# Patient Record
Sex: Female | Born: 1940
Health system: Southern US, Community
[De-identification: ages and names within clinical notes are randomized; demographics above are authoritative.]

## PROBLEM LIST (undated history)

## (undated) DIAGNOSIS — I639 Cerebral infarction, unspecified: Secondary | ICD-10-CM

## (undated) DIAGNOSIS — R1013 Epigastric pain: Secondary | ICD-10-CM

## (undated) DIAGNOSIS — F419 Anxiety disorder, unspecified: Secondary | ICD-10-CM

## (undated) DIAGNOSIS — G8929 Other chronic pain: Secondary | ICD-10-CM

## (undated) DIAGNOSIS — G629 Polyneuropathy, unspecified: Secondary | ICD-10-CM

## (undated) DIAGNOSIS — E785 Hyperlipidemia, unspecified: Secondary | ICD-10-CM

## (undated) DIAGNOSIS — J309 Allergic rhinitis, unspecified: Secondary | ICD-10-CM

## (undated) DIAGNOSIS — M549 Dorsalgia, unspecified: Secondary | ICD-10-CM

## (undated) DIAGNOSIS — E119 Type 2 diabetes mellitus without complications: Secondary | ICD-10-CM

## (undated) HISTORY — DX: Other chronic pain: G89.29

## (undated) HISTORY — DX: Anxiety disorder, unspecified: F41.9

## (undated) HISTORY — DX: Hyperlipidemia, unspecified: E78.5

## (undated) HISTORY — DX: Type 2 diabetes mellitus without complications: E11.9

## (undated) HISTORY — DX: Polyneuropathy, unspecified: G62.9

## (undated) HISTORY — PX: INGUINAL HERNIA REPAIR: SHX194

## (undated) HISTORY — PX: OOPHORECTOMY: SHX86

## (undated) HISTORY — PX: PARTIAL HYSTERECTOMY: SHX80

## (undated) HISTORY — DX: Allergic rhinitis, unspecified: J30.9

## (undated) HISTORY — PX: TONSILLECTOMY: SHX5217

## (undated) HISTORY — DX: Epigastric pain: R10.13

## (undated) HISTORY — DX: Other chronic pain: M54.9

---

## 1965-08-13 HISTORY — PX: NASAL SEPTUM SURGERY: SHX37

## 2002-12-16 ENCOUNTER — Other Ambulatory Visit: Admission: RE | Admit: 2002-12-16 | Discharge: 2002-12-16 | Payer: Self-pay | Admitting: Dermatology

## 2004-02-23 ENCOUNTER — Inpatient Hospital Stay (HOSPITAL_COMMUNITY): Admission: EM | Admit: 2004-02-23 | Discharge: 2004-02-24 | Payer: Self-pay | Admitting: Emergency Medicine

## 2004-03-09 ENCOUNTER — Encounter: Payer: Self-pay | Admitting: Cardiology

## 2004-04-04 ENCOUNTER — Ambulatory Visit (HOSPITAL_COMMUNITY): Admission: RE | Admit: 2004-04-04 | Discharge: 2004-04-04 | Payer: Self-pay | Admitting: Family Medicine

## 2004-06-22 ENCOUNTER — Ambulatory Visit: Payer: Self-pay | Admitting: Family Medicine

## 2004-11-02 ENCOUNTER — Ambulatory Visit: Payer: Self-pay | Admitting: Unknown Physician Specialty

## 2005-12-17 ENCOUNTER — Other Ambulatory Visit: Payer: Self-pay

## 2005-12-17 ENCOUNTER — Ambulatory Visit: Payer: Self-pay | Admitting: Unknown Physician Specialty

## 2005-12-20 ENCOUNTER — Emergency Department: Payer: Self-pay | Admitting: General Practice

## 2005-12-20 ENCOUNTER — Other Ambulatory Visit: Payer: Self-pay

## 2006-02-05 ENCOUNTER — Ambulatory Visit: Payer: Self-pay | Admitting: Internal Medicine

## 2006-04-03 ENCOUNTER — Ambulatory Visit: Payer: Self-pay

## 2006-07-02 ENCOUNTER — Ambulatory Visit: Payer: Self-pay | Admitting: Chiropractic Medicine

## 2007-03-04 ENCOUNTER — Ambulatory Visit: Payer: Self-pay

## 2007-03-11 ENCOUNTER — Ambulatory Visit: Payer: Self-pay

## 2007-09-25 ENCOUNTER — Ambulatory Visit: Payer: Self-pay | Admitting: Gastroenterology

## 2007-09-25 ENCOUNTER — Encounter: Payer: Self-pay | Admitting: Family Medicine

## 2008-03-12 ENCOUNTER — Ambulatory Visit: Payer: Self-pay | Admitting: Unknown Physician Specialty

## 2008-03-12 ENCOUNTER — Encounter: Payer: Self-pay | Admitting: Family Medicine

## 2008-08-30 ENCOUNTER — Encounter: Payer: Self-pay | Admitting: Family Medicine

## 2008-10-28 ENCOUNTER — Ambulatory Visit: Payer: Self-pay | Admitting: Family Medicine

## 2008-10-28 DIAGNOSIS — E785 Hyperlipidemia, unspecified: Secondary | ICD-10-CM | POA: Insufficient documentation

## 2008-10-28 DIAGNOSIS — H918X9 Other specified hearing loss, unspecified ear: Secondary | ICD-10-CM

## 2008-10-29 ENCOUNTER — Encounter: Payer: Self-pay | Admitting: Family Medicine

## 2008-10-29 LAB — CONVERTED CEMR LAB
Nitrite: NEGATIVE
Protein, ur: NEGATIVE mg/dL
Urine Glucose: NEGATIVE mg/dL
WBC, UA: NONE SEEN cells/hpf (ref <3)

## 2008-10-31 ENCOUNTER — Encounter: Payer: Self-pay | Admitting: Family Medicine

## 2008-11-01 ENCOUNTER — Encounter: Payer: Self-pay | Admitting: Family Medicine

## 2008-11-02 LAB — CONVERTED CEMR LAB: Glucose, Fasting: 115 mg/dL — ABNORMAL HIGH (ref 70–99)

## 2008-11-03 ENCOUNTER — Encounter: Payer: Self-pay | Admitting: Family Medicine

## 2008-11-03 LAB — CONVERTED CEMR LAB
Albumin: 4.4 g/dL (ref 3.5–5.2)
Alkaline Phosphatase: 65 units/L (ref 39–117)
Bilirubin, Direct: 0.1 mg/dL (ref 0.0–0.3)
CO2: 24 meq/L (ref 19–32)
Chloride: 104 meq/L (ref 96–112)
Creatinine, Ser: 0.73 mg/dL (ref 0.40–1.20)
Glucose, Bld: 124 mg/dL — ABNORMAL HIGH (ref 70–99)
Indirect Bilirubin: 0.5 mg/dL (ref 0.0–0.9)
Potassium: 4.3 meq/L (ref 3.5–5.3)
Total Bilirubin: 0.6 mg/dL (ref 0.3–1.2)
Total Protein: 7.5 g/dL (ref 6.0–8.3)
Triglycerides: 160 mg/dL — ABNORMAL HIGH (ref <150)

## 2008-11-04 ENCOUNTER — Ambulatory Visit: Payer: Self-pay | Admitting: Family Medicine

## 2008-11-07 DIAGNOSIS — J309 Allergic rhinitis, unspecified: Secondary | ICD-10-CM

## 2008-11-10 ENCOUNTER — Ambulatory Visit: Payer: Self-pay | Admitting: Cardiology

## 2008-12-10 ENCOUNTER — Ambulatory Visit: Payer: Self-pay | Admitting: Family Medicine

## 2008-12-17 DIAGNOSIS — F411 Generalized anxiety disorder: Secondary | ICD-10-CM

## 2008-12-31 ENCOUNTER — Ambulatory Visit: Payer: Self-pay | Admitting: Family Medicine

## 2008-12-31 DIAGNOSIS — R21 Rash and other nonspecific skin eruption: Secondary | ICD-10-CM | POA: Insufficient documentation

## 2009-01-05 ENCOUNTER — Encounter: Payer: Self-pay | Admitting: Family Medicine

## 2009-01-11 ENCOUNTER — Ambulatory Visit (HOSPITAL_COMMUNITY): Admission: RE | Admit: 2009-01-11 | Discharge: 2009-01-11 | Payer: Self-pay | Admitting: Orthopaedic Surgery

## 2009-01-12 ENCOUNTER — Encounter (HOSPITAL_COMMUNITY): Admission: RE | Admit: 2009-01-12 | Discharge: 2009-02-11 | Payer: Self-pay | Admitting: Orthopaedic Surgery

## 2009-02-08 DIAGNOSIS — G609 Hereditary and idiopathic neuropathy, unspecified: Secondary | ICD-10-CM | POA: Insufficient documentation

## 2009-02-08 DIAGNOSIS — D539 Nutritional anemia, unspecified: Secondary | ICD-10-CM | POA: Insufficient documentation

## 2009-03-04 ENCOUNTER — Telehealth: Payer: Self-pay | Admitting: Family Medicine

## 2009-03-04 LAB — CONVERTED CEMR LAB
Cholesterol: 271 mg/dL — ABNORMAL HIGH (ref 0–200)
LDL Cholesterol: 206 mg/dL — ABNORMAL HIGH (ref 0–99)
Triglycerides: 122 mg/dL (ref <150)

## 2009-03-14 ENCOUNTER — Ambulatory Visit: Payer: Self-pay | Admitting: Unknown Physician Specialty

## 2009-06-13 ENCOUNTER — Ambulatory Visit: Payer: Self-pay | Admitting: Family Medicine

## 2009-06-13 LAB — CONVERTED CEMR LAB: Blood Glucose, Fasting: 100 mg/dL

## 2009-06-15 ENCOUNTER — Encounter: Payer: Self-pay | Admitting: Family Medicine

## 2009-06-16 LAB — CONVERTED CEMR LAB: Indirect Bilirubin: 0.2 mg/dL (ref 0.0–0.9)

## 2009-06-20 LAB — CONVERTED CEMR LAB
CO2: 24 meq/L (ref 19–32)
Calcium: 10.1 mg/dL (ref 8.4–10.5)
Cholesterol: 251 mg/dL — ABNORMAL HIGH (ref 0–200)
Creatinine, Ser: 0.65 mg/dL (ref 0.40–1.20)
HCT: 43.6 % (ref 36.0–46.0)
Lymphocytes Relative: 38 % (ref 12–46)
Lymphs Abs: 2.6 10*3/uL (ref 0.7–4.0)
MCV: 96.9 fL (ref 78.0–100.0)
Neutro Abs: 3.6 10*3/uL (ref 1.7–7.7)
Platelets: 239 10*3/uL (ref 150–400)
Potassium: 4.6 meq/L (ref 3.5–5.3)
Sodium: 144 meq/L (ref 135–145)
Total CHOL/HDL Ratio: 5.7
Triglycerides: 156 mg/dL — ABNORMAL HIGH (ref <150)
VLDL: 31 mg/dL (ref 0–40)
WBC: 6.9 10*3/uL (ref 4.0–10.5)

## 2009-11-14 ENCOUNTER — Other Ambulatory Visit: Admission: RE | Admit: 2009-11-14 | Discharge: 2009-11-14 | Payer: Self-pay | Admitting: Family Medicine

## 2009-11-14 ENCOUNTER — Ambulatory Visit: Payer: Self-pay | Admitting: Family Medicine

## 2009-11-18 ENCOUNTER — Encounter: Payer: Self-pay | Admitting: Cardiology

## 2009-11-23 LAB — CONVERTED CEMR LAB
ALT: 13 units/L (ref 0–35)
AST: 12 units/L (ref 0–37)
Bilirubin, Direct: 0.1 mg/dL (ref 0.0–0.3)
Calcium: 9.6 mg/dL (ref 8.4–10.5)
Chloride: 106 meq/L (ref 96–112)
HDL: 44 mg/dL (ref >39)
Indirect Bilirubin: 0.5 mg/dL (ref 0.0–0.9)
Sodium: 142 meq/L (ref 135–145)
Total Bilirubin: 0.6 mg/dL (ref 0.3–1.2)
Triglycerides: 148 mg/dL (ref <150)
VLDL: 30 mg/dL (ref 0–40)

## 2009-12-12 ENCOUNTER — Telehealth: Payer: Self-pay | Admitting: Family Medicine

## 2009-12-18 ENCOUNTER — Emergency Department (HOSPITAL_COMMUNITY): Admission: EM | Admit: 2009-12-18 | Discharge: 2009-12-18 | Payer: Self-pay | Admitting: Emergency Medicine

## 2010-02-14 ENCOUNTER — Telehealth (INDEPENDENT_AMBULATORY_CARE_PROVIDER_SITE_OTHER): Payer: Self-pay | Admitting: *Deleted

## 2010-02-21 ENCOUNTER — Telehealth: Payer: Self-pay | Admitting: Family Medicine

## 2010-03-21 ENCOUNTER — Ambulatory Visit: Payer: Self-pay | Admitting: Family Medicine

## 2010-03-22 LAB — CONVERTED CEMR LAB: Hgb A1c MFr Bld: 6.3 % — ABNORMAL HIGH (ref <5.7)

## 2010-03-23 ENCOUNTER — Ambulatory Visit: Payer: Self-pay | Admitting: Family Medicine

## 2010-03-24 ENCOUNTER — Ambulatory Visit (HOSPITAL_COMMUNITY)
Admission: RE | Admit: 2010-03-24 | Discharge: 2010-03-24 | Payer: Self-pay | Source: Home / Self Care | Admitting: Family Medicine

## 2010-03-28 ENCOUNTER — Encounter: Payer: Self-pay | Admitting: Family Medicine

## 2010-03-30 ENCOUNTER — Encounter: Payer: Self-pay | Admitting: Family Medicine

## 2010-03-31 ENCOUNTER — Encounter: Payer: Self-pay | Admitting: Family Medicine

## 2010-04-01 ENCOUNTER — Telehealth: Payer: Self-pay | Admitting: Family Medicine

## 2010-04-04 ENCOUNTER — Ambulatory Visit: Payer: Self-pay | Admitting: Family Medicine

## 2010-04-12 ENCOUNTER — Encounter: Payer: Self-pay | Admitting: Family Medicine

## 2010-05-09 ENCOUNTER — Telehealth: Payer: Self-pay | Admitting: Physician Assistant

## 2010-06-15 ENCOUNTER — Telehealth: Payer: Self-pay | Admitting: Family Medicine

## 2010-06-19 ENCOUNTER — Encounter: Payer: Self-pay | Admitting: Family Medicine

## 2010-06-21 ENCOUNTER — Encounter: Payer: Self-pay | Admitting: Family Medicine

## 2010-06-21 LAB — CONVERTED CEMR LAB
Basophils Relative: 1 % (ref 0–1)
Eosinophils Absolute: 0.4 10*3/uL (ref 0.0–0.7)
HDL: 50 mg/dL (ref >39)
Hemoglobin: 13.7 g/dL (ref 12.0–15.0)
LDL Cholesterol: 189 mg/dL — ABNORMAL HIGH (ref 0–99)
Lymphs Abs: 2.4 10*3/uL (ref 0.7–4.0)
Neutro Abs: 3 10*3/uL (ref 1.7–7.7)
Neutrophils Relative %: 48 % (ref 43–77)
TSH: 3.429 microintl units/mL (ref 0.350–4.500)
VLDL: 26 mg/dL (ref 0–40)

## 2010-06-22 ENCOUNTER — Encounter: Payer: Self-pay | Admitting: Family Medicine

## 2010-06-23 ENCOUNTER — Ambulatory Visit: Payer: Self-pay | Admitting: Family Medicine

## 2010-06-25 DIAGNOSIS — E739 Lactose intolerance, unspecified: Secondary | ICD-10-CM

## 2010-06-27 ENCOUNTER — Encounter: Payer: Self-pay | Admitting: Family Medicine

## 2010-09-10 LAB — CONVERTED CEMR LAB
OCCULT 1: NEGATIVE
Pap Smear: NEGATIVE

## 2010-09-12 NOTE — Letter (Signed)
Summary: ekg  ekg   Imported By: Faythe Ghee 11/18/2009 10:39:43  _____________________________________________________________________  External Attachment:    Type:   Image     Comment:   External Document

## 2010-09-12 NOTE — Progress Notes (Signed)
Summary: referral  Phone Note Other Incoming   Caller: dr simpson Summary of Call: pt has abnormal mamo, I requested this past week on the report that she be advised of this and arrangements made to ensure she has the necessary further imaging studies needed, pls let me know about this.  pLS sTAMP A SIGNED ORDER FOR the appt date you have scanned in thank you Initial call taken by: Syliva Overman MD,  April 01, 2010 9:41 AM  Follow-up for Phone Call        pt was notified last week and has appt this week for futher imagining. Follow-up by: Rudene Anda,  April 03, 2010 9:29 AM

## 2010-09-12 NOTE — Progress Notes (Signed)
Summary: rx  Phone Note Call from Patient   Summary of Call: pt would like to geta rx [facility] rhinocort. [phone]  Initial call taken by: Rudene Anda,  May 09, 2010 10:40 AM  Follow-up for Phone Call        Is this a new prescription for her?  I dont see it on her med list.  If yes, what syptoms is she having? Follow-up by: Esperanza Sheets PA,  May 09, 2010 11:53 AM  Additional Follow-up for Phone Call Additional follow up Details #1::        She used it years ago when she was having problems with her sinuses being stopped up. She said that her nose stays stopped up all the time and nothing ever comes out. It makes her woozy feeling and she doesn't know what to take. She thought maybe since the rhinocort helped before she could use it again.  Additional Follow-up by: Everitt Amber LPN,  May 09, 2010 1:17 PM    Additional Follow-up for Phone Call Additional follow up Details #2::    OK, refilled.  Appt if no improvement. Follow-up by: Esperanza Sheets PA,  May 09, 2010 1:22 PM  Additional Follow-up for Phone Call Additional follow up Details #3:: Details for Additional Follow-up Action Taken: Patient aware Additional Follow-up by: Everitt Amber LPN,  May 09, 2010 1:27 PM  New/Updated Medications: RHINOCORT AQUA 32 MCG/ACT SUSP (BUDESONIDE) use 2 sprays each nostril once daily  Prescriptions: RHINOCORT AQUA 32 MCG/ACT SUSP (BUDESONIDE) use 2 sprays each nostril once daily  #1 x 1   Entered and Authorized by:   Esperanza Sheets PA   Signed by:   Esperanza Sheets PA on 05/09/2010   Method used:   [facility] to        [facility]. [phone]* (retail)       [address]       [place], [place]  [address]       Ph: [phone] or [phone]       Fax: [phone]   RxID:   [id number]

## 2010-09-12 NOTE — Letter (Signed)
Summary: LAB ADD ON  LAB ADD ON   Imported By: Lind Guest 06/22/2010 09:01:11  _____________________________________________________________________  External Attachment:    Type:   Image     Comment:   External Document

## 2010-09-12 NOTE — Letter (Signed)
Summary: rx assistance  rx assistance   Imported By: Lind Guest 06/27/2010 07:48:10  _____________________________________________________________________  External Attachment:    Type:   Image     Comment:   External Document

## 2010-09-12 NOTE — Letter (Signed)
Summary: ent  ent   Imported By: Lind Guest 06/26/2010 16:49:56  _____________________________________________________________________  External Attachment:    Type:   Image     Comment:   External Document

## 2010-09-12 NOTE — Progress Notes (Signed)
Summary: MAMMOGRAM APPT  Phone Note Call from Patient   Summary of Call: [facility] CALLED HER AND TOLD HER WAS TIME FOR HER TO GET A MAMMOGRAM  AND THAT DR. Lodema Hong NEED TO [facility] HER OVER PHONE # [phone] AND THE FAX # [phone] Initial call taken by: Lind Guest,  February 21, 2010 9:58 AM  Follow-up for Phone Call        pls verify this is due and schedule, thanks Follow-up by: Syliva Overman MD,  February 21, 2010 11:58 AM  Additional Follow-up for Phone Call Additional follow up Details #1::        called and left message for almance to call back so I could schedule pt a mammo.  Additional Follow-up by: Rudene Anda,  February 21, 2010 3:39 PM    Additional Follow-up for Phone Call Additional follow up Details #2::    pt has appt 03/15/2010 3:00. pt was called and aware Follow-up by: Rudene Anda,  February 21, 2010 4:01 PM

## 2010-09-12 NOTE — Assessment & Plan Note (Signed)
Summary: office visit   Vital Signs:  Patient profile:   70 year old female Menstrual status:  postmenopausal Height:      67 inches Weight:      169 pounds BMI:     26.56 O2 Sat:      97 % Pulse rate:   60 / minute Pulse rhythm:   regular Resp:     16 per minute BP sitting:   100 / 70  (left arm) Cuff size:   regular  Vitals Entered By: Everitt Amber LPN (March 21, 2010 8:53 AM)  Nutrition Counseling: Patient's BMI is greater than 25 and therefore counseled on weight management options. CC: Follow up chronic problems Comments Follow up chronic problems   CC:  Follow up chronic problems.  History of Present Illness: Reports  that she has been doing fairly well. she has not been as active recently due to the weather, and she is rightly concerned that her blood sugars are not as they had been in the past. She intends to correct this, still wANTS TO AVOID MEDS IF AT ALL POSSIBLE. Denies recent fever or chills. Denies sinus pressure, nasal congestion , ear pain or sore throat. Denies chest congestion, or cough productive of sputum. Denies chest pain, palpitations, PND, orthopnea or leg swelling. Denies abdominal pain, nausea, vomitting, diarrhea or constipation. Denies change in bowel movements or bloody stool. Denies dysuria , frequency, incontinence or hesitancy. Denies  joint pain, swelling, or reduced mobility. Denies headaches,  seizures.Reports dizziness intermittently for the apst 6 months. Denies depression,or insomnia. Denies  rash, lesions, or itch.     Current Medications (verified): 1)  Alprazolam 0.25 Mg Tabs (Alprazolam) .... Take 1 Tablet By Mouth Two Times A Day 2)  Adult Aspirin Ec Low Strength 81 Mg Tbec (Aspirin) .... Take 1 Tablet By Mouth Once A Day 3)  One-A-Day Weight Smart Advance  Tabs (Multiple Vitamins-Minerals) .... Take 1 Tablet By Mouth Once A Day 4)  Claritin 10 Mg Tabs (Loratadine) .... One Tab By Mouth Qd 5)  Zantac 150 Mg Tabs (Ranitidine  Hcl) .... One Tab By Mouth Qd 6)  Red Yeast Rice Extract 600 Mg Caps (Red Yeast Rice Extract) .... Take 1 Tablet By Mouth Once A Day 7)  Fish Oil 1000 Mg Caps (Omega-3 Fatty Acids) .... Two Times A Day 8)  Meclizine Hcl 25 Mg Tabs (Meclizine Hcl) .... 1/2 Tab 6-8 Hrs 9)  Accu-Chek Aviva  Strp (Glucose Blood) .... Once Daily Testing  Allergies (verified): 1)  ! Sudafed 2)  ! * Toprol 3)  ! Cholesterol Meds  Past History:  Past Medical History: Vit D deficiency Hardening of arteries in her aorta cardiac cath negative 5 years colonscopy 2009, polyps x 3 rept in 3 years needed seasonal allergies when uncontrolled, she has had vertigo which has been very severe R leg pain, swelling and numbness, states she was told she had peripheral neuropathy. DYSPEPSIA, CHRONIC (ICD-536.8) FATIGUE (ICD-780.79) HYPERLIPIDEMIA (ICD-272.4) CHEST PAIN UNSPECIFIED (ICD-786.50) OTHER B-COMPLEX DEFICIENCIES (ICD-266.2) PERIPHERAL NEUROPATHY (ICD-356.9) SKIN RASH (ICD-782.1) SHOULDER PAIN, RIGHT (ICD-719.41) ANXIETY STATE, UNSPECIFIED (ICD-300.00) ALLERGIC RHINITIS CAUSE UNSPECIFIED (ICD-477.9) Back pain sees chiropracter for almost 50 yrs, fell off a tractor at age 89 DIABETES MELLITUS, TYPE II (ICD-250.00) OTHER SPECIFIED FORMS OF HEARING LOSS (ICD-389.8)  Review of Systems      See HPI General:  Complains of fatigue. Eyes:  Denies blurring and discharge. Endo:  Denies cold intolerance, excessive thirst, and excessive urination. Heme:  Denies abnormal  bruising and bleeding. Allergy:  Denies hives or rash and itching eyes.  Physical Exam  General:  Well-developed,well-nourished,in no acute distress; alert,appropriate and cooperative throughout examination HEENT: No facial asymmetry,  EOMI, No sinus tenderness, TM's Clear, oropharynx  pink and moist. bilateral bruits.  Chest: Clear to auscultation bilaterally.  CVS: S1, S2, No murmurs, No S3.   Abd: Soft, Nontender.  MS: Adequate ROM spine,  hips, shoulders and knees.  Ext: No edema.   CNS: CN 2-12 intact, power tone and sensation normal throughout.   Skin: Intact, no visible lesions or rashes.  Psych: Good eye contact, normal affect.  Memory intact, not anxious or depressed appearing.    Impression & Recommendations:  Problem # 1:  CAROTID BRUIT (ICD-785.9) Assessment Comment Only  Orders: Radiology Referral (Radiology)  Problem # 2:  SHOULDER PAIN (ICD-719.41) Assessment: Improved  Her updated medication list for this problem includes:    Adult Aspirin Ec Low Strength 81 Mg Tbec (Aspirin) .Marland Kitchen... Take 1 tablet by mouth once a day  Problem # 3:  HYPERLIPIDEMIA (ICD-272.4) Assessment: Comment Only  Orders: T-Lipid Profile ([id number])  Labs Reviewed: SGOT: 12 (11/22/2009)   SGPT: 13 (11/22/2009)   HDL:44 (11/22/2009), 44 (06/13/2009)  LDL:166 (11/22/2009), 176 (06/13/2009)  Chol:240 (11/22/2009), 251 (06/13/2009)  Trig:148 (11/22/2009), 156 (06/13/2009) intolerant of statins, diet and exercisae only at this time  Complete Medication List: 1)  Alprazolam 0.25 Mg Tabs (Alprazolam) .... Take 1 tablet by mouth two times a day 2)  Adult Aspirin Ec Low Strength 81 Mg Tbec (Aspirin) .... Take 1 tablet by mouth once a day 3)  One-a-day Weight Smart Advance Tabs (Multiple vitamins-minerals) .... Take 1 tablet by mouth once a day 4)  Claritin 10 Mg Tabs (Loratadine) .... One tab by mouth qd 5)  Zantac 150 Mg Tabs (Ranitidine hcl) .... One tab by mouth qd 6)  Red Yeast Rice Extract 600 Mg Caps (Red yeast rice extract) .... Take 1 tablet by mouth once a day 7)  Fish Oil 1000 Mg Caps (Omega-3 fatty acids) .... Two times a day 8)  Meclizine Hcl 25 Mg Tabs (Meclizine hcl) .... 1/2 tab 6-8 hrs 9)  Accu0chek Aviva Strips and Lancets  .... Once daily testing  Other Orders: T- Hemoglobin A1C ([id number]) T-CBC w/Diff ([id number]) T-TSH ([id number])  Patient Instructions: 1)  Please schedule a follow-up appointment in 3  months. 2)  It is important that you exercise regularly at least 20 minutes 5 times a week. If you develop chest pain, have severe difficulty breathing, or feel very tired , stop exercising immediately and seek medical attention. 3)  You need to lose weight. Consider a lower calorie diet and regular exercise.  4)  Lipid Panel prior to visit, ICD-9: 5)  TSH prior to visit, ICD-9: 6)  CBC w/ Diff prior to visit, ICD-9:fasting in 3 months 7)  HbgA1C prior to visit, ICD-9: 8)  You will be referred for a xcarotid doppler to check fopr blockage in the arteries since you get dizzy at times Prescriptions: ACCU0CHEK AVIVA STRIPS AND LANCETS once daily testing  #100 x 5   Entered by:   Everitt Amber LPN   Authorized by:   Syliva Overman MD   Signed by:   Everitt Amber LPN on 28/41/3244   Method used:   Printed then faxed to ...       [facility] [phone]* (retail)       [address]       [place]  [place], [place]  [address]       Ph: [phone] or [phone]       Fax: [phone]   RxID:   [id number]

## 2010-09-12 NOTE — Letter (Signed)
Summary: medical release  medical release   Imported By: Lind Guest 06/23/2010 15:11:31  _____________________________________________________________________  External Attachment:    Type:   Image     Comment:   External Document

## 2010-09-12 NOTE — Progress Notes (Signed)
Summary: ear and jaw pain  Phone Note Call from Patient   Summary of Call: patient has had ear pain and under her jaw since last Wednesday, she states she does have a filling out of her tooth.  She states that it is really uncomfortable.  Please advise. Initial call taken by: Curtis Sites,  June 15, 2010 10:31 AM  Follow-up for Phone Call        PATIENT STATES SHE HAS APPT MADE WITH ENT NEXT WEEK AND DENTIST MID MONTH, STATES SHE WILL BE FINE UNTIL THEN    Follow-up by: Adella Hare LPN,  June 15, 2010 12:04 PM

## 2010-09-12 NOTE — Letter (Signed)
Summary: SIGNEN ORDER FOR MAMMO  SIGNEN ORDER FOR MAMMO   Imported By: Lind Guest 04/12/2010 16:22:31  _____________________________________________________________________  External Attachment:    Type:   Image     Comment:   External Document

## 2010-09-12 NOTE — Letter (Signed)
[facility]   Imported By: Lind Guest 06/27/2010 09:40:53  _____________________________________________________________________  External Attachment:    Type:   Image     Comment:   External Document

## 2010-09-12 NOTE — Progress Notes (Signed)
Summary: lab bill  Phone Note Call from Patient   Summary of Call: patient is upset, states her lab wasn't billed correctly her insurance isn't paying for any of it. I asked her to bring the bill by here.  She will bring by tomorrow. Initial call taken by: Curtis Sites,  February 14, 2010 8:24 AM  Follow-up for Phone Call        MADE COPY AND GAVE TO SHANNON Follow-up by: Lind Guest,  February 15, 2010 10:00 AM

## 2010-09-12 NOTE — Progress Notes (Signed)
Summary: rx  Phone Note Call from Patient   Summary of Call: needs a rx for test stripes. [phone] Initial call taken by: Rudene Anda,  Dec 12, 2009 8:57 AM  Follow-up for Phone Call        Rx Called In Follow-up by: Adella Hare LPN,  Dec 12, 4401 9:22 AM    New/Updated Medications: ACCU-CHEK AVIVA  STRP (GLUCOSE BLOOD) once daily testing Prescriptions: ACCU-CHEK AVIVA  STRP (GLUCOSE BLOOD) once daily testing  #100 x 2   Entered by:   Adella Hare LPN   Authorized by:   Syliva Overman MD   Signed by:   Adella Hare LPN on 47/42/5956   Method used:   Electronically to        [facility]. [phone]* (retail)       [address]       [place], [place]  [address]       Ph: [phone] or [phone]       Fax: [phone]   RxID:   [id number]

## 2010-09-12 NOTE — Assessment & Plan Note (Signed)
Summary: PHY   Vital Signs:  Patient profile:   70 year old female Menstrual status:  postmenopausal Height:      67 inches Weight:      170.50 pounds BMI:     26.80 O2 Sat:      97 % Pulse rate:   48 / minute Pulse rhythm:   regular Resp:     16 per minute BP supine:   110 / 70  (right arm)  Vitals Entered By: Everitt Amber LPN (November 14, 8117 9:54 AM)  Nutrition Counseling: Patient's BMI is greater than 25 and therefore counseled on weight management options. CC: CPE Is Patient Diabetic? Yes  Vision Screening:Left eye with correction: 20 / 25 Right eye with correction: 20 / 40 Both eyes with correction: 20 / 20  Color vision testing: normal      Vision Entered By: Everitt Amber LPN (November 14, 1476 9:58 AM)   CC:  CPE.  History of Present Illness: Pt reports that she has ben doing fairly well. She continues to be diligent in a gealthy diet, with reduced carb and lipid intake, and she also keeps active. She denies any recent fever or chills. She denies excessive head or chest congestiuon. She denies uncontrolled allergy symptoms. She does report occasional palpitations, and shoulder pain and stiffnress whichis worsening. SAhe denies symptoms of elevated blood sugars  Current Medications (verified): 1)  Alprazolam 0.25 Mg Tabs (Alprazolam) .... Take 1 Tablet By Mouth Two Times A Day 2)  Adult Aspirin Ec Low Strength 81 Mg Tbec (Aspirin) .... Take 1 Tablet By Mouth Once A Day 3)  One-A-Day Weight Smart Advance  Tabs (Multiple Vitamins-Minerals) .... Take 1 Tablet By Mouth Once A Day 4)  Claritin 10 Mg Tabs (Loratadine) .... One Tab By Mouth Qd 5)  Zantac 150 Mg Tabs (Ranitidine Hcl) .... One Tab By Mouth Qd 6)  Red Yeast Rice Extract 600 Mg Caps (Red Yeast Rice Extract) .... Take 1 Tablet By Mouth Once A Day 7)  Fish Oil 1000 Mg Caps (Omega-3 Fatty Acids) .... Two Times A Day 8)  Meclizine Hcl 25 Mg Tabs (Meclizine Hcl) .... 1/2 Tab 6-8 Hrs  Allergies (verified): 1)  !  Sudafed 2)  ! * Toprol 3)  ! Cholesterol Meds  Review of Systems General:  Complains of fatigue; denies chills, fever, sleep disorder, and weakness. Eyes:  Denies blurring, discharge, eye pain, and red eye. ENT:  Complains of nasal congestion; denies hoarseness, ringing in ears, sinus pressure, and sore throat. CV:  Complains of palpitations; denies chest pain or discomfort, difficulty breathing while lying down, shortness of breath with exertion, and swelling of feet; occasional palpitations in the setting of chroinic bradycardia. Resp:  Denies cough and sputum productive. GI:  Denies abdominal pain, change in bowel habits, constipation, nausea, and vomiting. GU:  Denies dysuria, incontinence, and urinary frequency. MS:  Complains of joint pain and stiffness; denies low back pain and mid back pain; chronic left  shoulder pain with reduced mobility which is worseneing. Derm:  Denies itching, lesion(s), and rash. Neuro:  Denies headaches, seizures, and sensation of room spinning. Psych:  Complains of anxiety; denies depression, mental problems, panic attacks, suicidal thoughts/plans, thoughts of violence, and unusual visions or sounds. Endo:  Denies excessive thirst and excessive urination. Heme:  Denies abnormal bruising and bleeding. Allergy:  Complains of seasonal allergies; denies hives or rash and itching eyes.  Physical Exam  General:  Well-developed,well-nourished,in no acute distress; alert,appropriate and  cooperative throughout examination Head:  Normocephalic and atraumatic without obvious abnormalities. No apparent alopecia or balding. Eyes:  No corneal or conjunctival inflammation noted. EOMI. Perrla. Funduscopic exam benign, without hemorrhages, exudates or papilledema. Vision grossly normal. Ears:  External ear exam shows no significant lesions or deformities.  Otoscopic examination reveals clear canals, tympanic membranes are intact bilaterally without bulging, retraction,  inflammation or discharge. Hearing is grossly normal bilaterally. Nose:  External nasal examination shows no deformity or inflammation. Nasal mucosa are pink and moist without lesions or exudates. Mouth:  pharynx pink and moist and fair dentition.   Neck:  No deformities, masses, or tenderness noted. Chest Wall:  No deformities, masses, or tenderness noted. Breasts:  No mass, nodules, thickening, tenderness, bulging, retraction, inflamation, nipple discharge or skin changes noted.   Lungs:  Normal respiratory effort, chest expands symmetrically. Lungs are clear to auscultation, no crackles or wheezes. Heart:  Normal rate and regular rhythm. S1 and S2 normal without gallop, murmur, click, rub or other extra sounds. Abdomen:  Bowel sounds positive,abdomen soft and non-tender without masses, organomegaly or hernias noted. Rectal:  No external abnormalities noted. Normal sphincter tone. No rectal masses or tenderness.guaic negative stool  Genitalia:  Normal introitus for age, no external lesions, no vaginal discharge, mucosa pink and moist, no vaginal or cervical lesions, no vaginal atrophy, no friaility or hemorrhage, normal uterus size and position, no adnexal masses or tenderness Msk:  No deformity or scoliosis noted of thoracic or lumbar spine.   Pulses:  R and L carotid,radial,femoral,dorsalis pedis and posterior tibial pulses are full and equal bilaterally Extremities:  decreased ROM left shoulder, adequate ROM spine, hips and knees Neurologic:  No cranial nerve deficits noted. Station and gait are normal. Plantar reflexes are down-going bilaterally. DTRs are symmetrical throughout. Sensory, motor and coordinative functions appear intact. Skin:  Intact without suspicious lesions or rashes Cervical Nodes:  No lymphadenopathy noted Axillary Nodes:  No palpable lymphadenopathy Inguinal Nodes:  No significant adenopathy Psych:  Cognition and judgment appear intact. Alert and cooperative with normal  attention span and concentration. No apparent delusions, illusions, hallucinations   Impression & Recommendations:  Problem # 1:  ABNORMAL ELECTROCARDIOGRAM (ICD-794.31) Assessment Comment Only  no change since 2010, noreferral needed at this time  Problem # 2:  SPECIAL SCREENING FOR MALIGNANT NEOPLASMS COLON (ICD-V76.51) Assessment: Comment Only  Orders: Hemoccult Guaiac-1 spec.(in office) (82270)  Problem # 3:  BRADYCARDIA (ICD-427.89) Assessment: Comment Only  Her updated medication list for this problem includes:    Adult Aspirin Ec Low Strength 81 Mg Tbec (Aspirin) .Marland Kitchen... Take 1 tablet by mouth once a day  Orders: EKG w/ Interpretation (93000) unchanged from 2010   Problem # 4:  DIABETES MELLITUS, TYPE II (ICD-250.00) Assessment: Comment Only  Her updated medication list for this problem includes:    Adult Aspirin Ec Low Strength 81 Mg Tbec (Aspirin) .Marland Kitchen... Take 1 tablet by mouth once a day  Orders: T- Hemoglobin A1C ([id number])  Labs Reviewed: Creat: 0.65 (06/13/2009)    Reviewed HgBA1c results: 6.0 (06/13/2009)  6.3 (10/28/2008)  Problem # 5:  SHOULDER PAIN (ICD-719.41) Assessment: Deteriorated pt to call for ortho referral when she is ready  Problem # 6:  HYPERLIPIDEMIA (ICD-272.4) Assessment: Comment Only  Orders: T-Hepatic Function ([id number]), pt reports intoleranxce to all statins T-Lipid Profile [phone])  Labs Reviewed: SGOT: 14 (06/15/2009)   SGPT: 12 (06/15/2009)   HDL:44 (06/13/2009), 41 (03/02/2009)  LDL:176 (06/13/2009), 206 (03/02/2009)  Chol:251 (06/13/2009), 271 (03/02/2009)  Trig:156 (06/13/2009), 122 (  03/02/2009)  Complete Medication List: 1)  Alprazolam 0.25 Mg Tabs (Alprazolam) .... Take 1 tablet by mouth two times a day 2)  Adult Aspirin Ec Low Strength 81 Mg Tbec (Aspirin) .... Take 1 tablet by mouth once a day 3)  One-a-day Weight Smart Advance Tabs (Multiple vitamins-minerals) .... Take 1 tablet by mouth once a day 4)   Claritin 10 Mg Tabs (Loratadine) .... One tab by mouth qd 5)  Zantac 150 Mg Tabs (Ranitidine hcl) .... One tab by mouth qd 6)  Red Yeast Rice Extract 600 Mg Caps (Red yeast rice extract) .... Take 1 tablet by mouth once a day 7)  Fish Oil 1000 Mg Caps (Omega-3 fatty acids) .... Two times a day 8)  Meclizine Hcl 25 Mg Tabs (Meclizine hcl) .... 1/2 tab 6-8 hrs  Other Orders: T-Basic Metabolic Panel [phone]) T-Vitamin D (25-Hydroxy) [phone]) Pelvic & Breast Exam ( Medicare)  [phone]) Cardiology Referral (Cardiology)  Patient Instructions: 1)  Please schedule a follow-up appointment in 4 months. 2)  It is important that you exercise regularly at least 20 minutes 5 times a week. If you develop chest pain, have severe difficulty breathing, or feel very tired , stop exercising immediately and seek medical attention. 3)  You need to lose weight. Consider a lower calorie diet and regular exercise.  4)  BMP prior to visit, ICD-9: 5)  Hepatic Panel prior to visit, ICD-9:  fasting asap 6)  Lipid Panel prior to visit, ICD-9: 7)  HbgA1C prior to visit, ICD-9: 8)  Vit D level 9)  pls call fro a referral to Dr Hilda Lias about your left shoulder when you need one. 10)  You will be referred to the cardiologist of your choice regarding your slow heart rate and abn eKG, since this is no different from th one in May 2005, this referral will not be done.   Laboratory Results    Stool - Occult Blood Hemmoccult #1: negative Date: 11/14/2009 Comments: [id number] 9R 8/11 118 10 12   Appended Document: PHY pls note, this pt does nOT need to be referred to cardiology at this time  Appended Document: PHY pt wasn't referred to cardilogy but she did make her own appt. She called last week and I sent EKG over for her

## 2010-09-12 NOTE — Assessment & Plan Note (Signed)
Summary: office visit   Vital Signs:  Patient profile:   70 year old female Menstrual status:  postmenopausal Height:      67 inches Weight:      167.50 pounds BMI:     26.33 O2 Sat:      95 % on Room air Pulse rate:   57 / minute Pulse rhythm:   regular Resp:     16 per minute BP sitting:   108 / 70  (left arm)  Vitals Entered By: Mauricia Area CMA (June 23, 2010 9:06 AM)  Nutrition Counseling: Patient's BMI is greater than 25 and therefore counseled on weight management options.  O2 Flow:  Room air CC: follow up   CC:  follow up.  History of Present Illness: Pt seen byENTt f, Dr Jenne Campus in [place] earlier this [facility] for vertigo, excellent results, also has ear drops, and flonase. Reports  thatshe has otherwise been  doing well. She has been diligent in following a ;low carb diet, but not quite as good with fats. Denies recent fever or chills. Denies sinus pressure, nasal congestion , ear pain or sore throat. Denies chest congestion, or cough productive of sputum. Denies chest pain, palpitations, PND, orthopnea or leg swelling. Denies abdominal pain, nausea, vomitting, diarrhea or constipation. Denies change in bowel movements or bloody stool. Denies dysuria , frequency, incontinence or hesitancy. Denies  joint pain, swelling, or reduced mobility. Denies headaches, vertigo, seizures. Denies depression, anxiety or insomnia. Denies  rash, lesions, or itch.     Current Medications (verified): 1)  Alprazolam 0.25 Mg Tabs (Alprazolam) .... Take 1 Tablet By Mouth Two Times A Day 2)  Adult Aspirin Ec Low Strength 81 Mg Tbec (Aspirin) .... Take 1 Tablet By Mouth Once A Day 3)  One-A-Day Weight Smart Advance  Tabs (Multiple Vitamins-Minerals) .... Take 1 Tablet By Mouth Once A Day 4)  Claritin 10 Mg Tabs (Loratadine) .... One Tab By Mouth Qd 5)  Zantac 150 Mg Tabs (Ranitidine Hcl) .... One Tab By Mouth Qd 6)  Red Yeast Rice Extract 600 Mg Caps (Red Yeast Rice Extract)  .... Take 1 Tablet By Mouth Once A Day 7)  Fish Oil 1000 Mg Caps (Omega-3 Fatty Acids) .... Two Times A Day 8)  Meclizine Hcl 25 Mg Tabs (Meclizine Hcl) .... 1/2 Tab 6-8 Hrs 9)  Accu0chek Aviva Strips and Lancets .... Once Daily Testing 10)  Rhinocort Aqua 32 Mcg/act Susp (Budesonide) .... Use 2 Sprays Each Nostril Once Daily  Allergies (verified): 1)  ! Sudafed 2)  ! * Toprol 3)  ! Cholesterol Meds  Review of Systems      See HPI General:  Complains of fatigue. Eyes:  Denies discharge and red eye. Psych:  Complains of anxiety. Endo:  Denies cold intolerance, excessive hunger, excessive thirst, and heat intolerance. Heme:  Denies abnormal bruising, bleeding, and enlarge lymph nodes. Allergy:  Complains of seasonal allergies; increased symptoms, as expected in the Fall.  Physical Exam  General:  Well-developed,well-nourished,in no acute distress; alert,appropriate and cooperative throughout examination HEENT: No facial asymmetry,  EOMI, No sinus tenderness, TM's Clear, oropharynx  pink and moist.   Chest: Clear to auscultation bilaterally.  CVS: S1, S2, No murmurs, No S3.   Abd: Soft, Nontender.  MS: Adequate ROM spine, hips, shoulders and knees.  Ext: No edema.   CNS: CN 2-12 intact, power tone and sensation normal throughout.   Skin: Intact, no visible lesions or rashes.  Psych: Good eye contact, normal affect.  Memory intact, not anxious or depressed appearing.    Impression & Recommendations:  Problem # 1:  HYPERLIPIDEMIA (ICD-272.4) Assessment Deteriorated  Orders: T-Lipid Profile ([id number]) Low fat dietdiscussed and encouraged  Labs Reviewed: SGOT: 12 (11/22/2009)   SGPT: 13 (11/22/2009)   HDL:50 (06/20/2010), 44 (11/22/2009)  LDL:189 (06/20/2010), 166 (11/22/2009)  Chol:265 (06/20/2010), 240 (11/22/2009)  Trig:128 (06/20/2010), 148 (11/22/2009)  Problem # 2:  IMPAIRED GLUCOSE TOLERANCE (ICD-271.3) Assessment: Improved pt applauded on this and encouraged to  continue same  Problem # 3:  ANXIETY STATE, UNSPECIFIED (ICD-300.00) Assessment: Improved  Her updated medication list for this problem includes:    Alprazolam 0.25 Mg Tabs (Alprazolam) .Marland Kitchen... Take 1 tablet by mouth two times a day  Problem # 4:  ALLERGIC RHINITIS CAUSE UNSPECIFIED (ICD-477.9) Assessment: Deteriorated  Her updated medication list for this problem includes:    Claritin 10 Mg Tabs (Loratadine) ..... One tab by mouth once daily.    Rhinocort Aqua 32 Mcg/act Susp (Budesonide) ..... Use 2 sprays each nostril once daily  Complete Medication List: 1)  Alprazolam 0.25 Mg Tabs (Alprazolam) .... Take 1 tablet by mouth two times a day 2)  Adult Aspirin Ec Low Strength 81 Mg Tbec (Aspirin) .... Take 1 tablet by mouth once a day 3)  One-a-day Weight Smart Advance Tabs (Multiple vitamins-minerals) .... Take 1 tablet by mouth once a day 4)  Claritin 10 Mg Tabs (Loratadine) .... One tab by mouth once daily. 5)  Zantac 150 Mg Tabs (Ranitidine hcl) .... One tab by mouth once daily. 6)  Red Yeast Rice Extract 600 Mg Caps (Red yeast rice extract) .... Take 1 tablet by mouth once a day 7)  Fish Oil 1000 Mg Caps (Omega-3 fatty acids) .... Two times a day 8)  Meclizine Hcl 25 Mg Tabs (Meclizine hcl) .... 1/2 tab 6-8 hrs 9)  Accu0chek Aviva Strips and Lancets  .... Once daily testing 10)  Rhinocort Aqua 32 Mcg/act Susp (Budesonide) .... Use 2 sprays each nostril once daily 11)  White Willow Bark 400 Mg  .... As needed 12)  Mometasone Furoate 0.1% Soln  .... Apply 4 drops to each ear at bedtime for itching 13)  Fluticasone Prop  .... Use 2 sprays in each nostril  Other Orders: T- Hemoglobin A1C ([id number])  Patient Instructions: 1)  Please schedule a follow-up appointment in 4 months. 2)  It is important that you exercise regularly at least 20 minutes 5 times a week. If you develop chest pain, have severe difficulty breathing, or feel very tired , stop exercising immediately and seek  medical attention. 3)  You need to lose weight. Consider a lower calorie diet and regular exercise.  4)  Please reduce tjhe amt of fatty foods you eat, your sugar is great  5)  Lipid Panel prior to visit, ICD-9: 6)  HbgA1C prior to visit, ICD-9: 7)  pLS come back forthe flu shot   Orders Added: 1)  Est. Patient Level IV [32202] 2)  T-Lipid Profile [[id number]] 3)  T- Hemoglobin A1C [[id number]]

## 2010-10-16 ENCOUNTER — Ambulatory Visit: Payer: Self-pay | Admitting: Family Medicine

## 2010-10-23 ENCOUNTER — Ambulatory Visit: Payer: Self-pay | Admitting: Family Medicine

## 2010-10-26 ENCOUNTER — Encounter: Payer: Self-pay | Admitting: Family Medicine

## 2010-10-27 ENCOUNTER — Encounter: Payer: Self-pay | Admitting: Family Medicine

## 2010-10-31 NOTE — Letter (Signed)
Summary: [facility]  [facility]   Imported By: Lind Guest 10/26/2010 10:15:53  _____________________________________________________________________  External Attachment:    Type:   Image     Comment:   External Document

## 2010-11-03 ENCOUNTER — Encounter: Payer: Self-pay | Admitting: Family Medicine

## 2010-12-26 NOTE — Assessment & Plan Note (Signed)
[facility]                       [facility] OFFICE NOTE   NAME:Voorhees, AVAEH EWER                       MRN:          [id number]  DATE:11/10/2008                            DOB:          1941-06-21    CHIEF COMPLAINT:  Palpitations and abnormal EKG.   Ms. Cheronda Way is a 70 year old white female, who is seen by Dr.  Syliva Overman today for consultation concerning an abnormal EKG.  She  does complain of some occasional palpitations as well.   She is currently having no symptoms of angina or ischemia.  She says on  occasion, she has a little bit lightheaded.  She has some mild dyspnea  on exertion when climbing hills.  This is not a recent change however.   She specifically denies any orthopnea, PND, peripheral edema, syncope or  presyncope.   She has been seen in our office before for lower extremity edema and  chest pain.  She had a cardiac catheterization on February 24, 2004, which  showed minimal plaque with normal left ventricular function.  At that  time, she had an EKG which showed sinus rhythm with a right superior  axis and T-wave changes across the anterolateral precordium.  Her EKG  today as well as one at Dr. Anthony Sar symptoms were quite similar.   Her risk factors for heart disease include age, hyperlipidemia with an  intolerance to STATINS.  Specifically, her HDL is 43, LDL was 142, and  total cholesterol is 215.  She has tried every statin including  LOVASTATIN, CRESTOR as well as SIMVASTATIN, all of which caused aches  and pains.   PAST MEDICAL HISTORY:   MEDICATIONS:  She is currently on;  1. Fish oil 2 a day.  2. Multivitamin daily.  3. Ranitidine 150 mg p.o. daily p.r.n.  4. Alprazolam 0.25 mg half tablet b.i.d. p.r.n.  5. Loratadine 10 mg a day.  6. Meclizine 25 mg half to one q.6 h. p.r.n.  7. Aspirin 81 mg per day.  8. She takes Tums p.r.n.Marland Kitchen   She is intolerant of SUDAFED, TOPROL, and STATINS.   She does  not smoke or drink.   SURGICAL PROCEDURES:  Hernia repair and oophorectomy.  She has had a  colonoscopy in the past.   SOCIAL HISTORY:  Retired.  She is married and has 2 children.   FAMILY HISTORY:  She has a family history of coronary artery disease in  her sisters and brothers.   REVIEW OF SYSTEMS:  Other than HPI, she has had some hearing loss.  She  has partial dentures.  She has had some colitis in the past with reflux  as well and some chronic arthritis.  Rest of the review of systems are  all questioned on the diagnostic evaluation form and are negative.   PHYSICAL EXAMINATION:  VITAL SIGNS:  Her blood pressure today is 112/67  in the left arm, her pulse is 55 and regular, 5 feet 7 inches, and  weighs 178 pounds.  HEENT:  Normal.  She is very talkative and tends to ramble and is a very  difficult historian.  HEENT otherwise is normal.  SKIN:  Warm and dry.  NECK:  Carotid upstrokes are equal bilaterally without bruits.  No JVD.  Thyroid is not enlarged.  Trachea is midline.  CHEST/LUNGS:  Clear to  auscultation and percussion.  HEART:  A poorly appreciated PMI.  Normal S1 and S2.  No murmur, rub, or  gallop.  ABDOMEN:  Soft, good bowel sounds.  No midline bruit.  No tenderness.  EXTREMITIES:  No cyanosis or clubbing, but only trace edema in the left  lower extremity, 1+ in the right.  There is no sign of DVT.  Pulses are  intact.  NEURO:  Intact.   EKG shows sinus brady with those ST-segment changes, which have not  changed in 4 or 5 years.   ASSESSMENT:  1. Abnormal EKG dating back to 2005 with no significant change now.  2. History of palpitations, which are fairly infrequent and benign.  3. History of normal left ventricular function.  4. History of Lagan plaque on the cardiac catheterization in 2005.  5. Hyperlipidemia.  She is intolerant to STATINS, which is      unfortunate.   I had a long talk with Ms. Konczal today.  I have reassured her that her  symptoms  are not coronary related and are of no major significance from  a cardiac standpoint.  I have encouraged her to continue take aspirin as  well as fish oil and certainly take Crestor that was recently prescribed  by Dr. Lodema Hong at 5 mg a day.   We will see her back p.r.n.    Thomas C. Daleen Squibb, MD, [facility]  Electronically Signed   TCW/MedQ  DD: 11/10/2008  DT: 11/11/2008  Job #: [id number]   cc:   Milus Mallick. Lodema Hong, M.D.

## 2010-12-29 NOTE — H&P (Signed)
NAME:  Taylor Mitchell, Taylor Mitchell                       ACCOUNT NO.:  [id number]   MEDICAL RECORD NO.:  [id number]                   PATIENT TYPE:  EMS   LOCATION:  [facility]                                 FACILITY:  [facility]   PHYSICIAN:  Doug Sou, M.D.                DATE OF BIRTH:  Sep 21, 1940   DATE OF ADMISSION:  02/23/2004  DATE OF DISCHARGE:                                HISTORY & PHYSICAL   PRIMARY CARE PHYSICIAN:  Dr. Barnabas Lister, [place], [place].  Also is seen by  Dr. Sherryl Manges and Dr. Valera Castle, the later, Dr. Daleen Squibb, she sees in  [place].   CHIEF COMPLAINT:  I've been having chest pain, it seems to come on with  exertion and goes away with some rest.   HISTORY OF PRESENT ILLNESS:  [name] is a 70 year old female who  presents with an acute event of central chest discomfort which started at  5:00 in the afternoon yesterday after the patient had been staying with her  mother through the day.  Her mother is at [facility] with advanced  ovarian cancer.  She also has a sister currently at [facility], and  the patient has been feeling a lot of stress lately.  The patient also cares  for her husband who is debilitated with a CVA experience two years ago.   Ms. [name]'s chest pain most recently came on yesterday while the patient was  waiting for dinner.  She had no concomitant symptoms such as diaphoresis,  nausea, shortness of breath.  She did note that she did have some radiation  of the pain across her shoulders.  She noted that the pain was a 5 to 6/10.  She got dinner, and then went back to sit by her mother she states until  about 2:00 a.m. this morning.  She went back home, slept poorly awoke to go  to the bathroom, noticed some residual chest pain, took an aspirin with only  mild relief.  When she awoke this morning, she was still having chest  discomfort.  She took another aspirin.  She felt also some numbness in the  upper extremities, and this  decided her to come to the emergency room.  She  is currently on IV nitroglycerin and is without chest pain.  On questioning  by Dr. Andee Lineman, cardiologist, she noticed some symptoms suggestive of  exertional angina.  EKG shows T-wave changes in the anterior precordial  leads.  Because the patient gives a two to three month history of chest  discomfort which is dull, aching, which comes on with exertion, we have  decided to admit the patient.   ALLERGIES:  1. TOPROL caused abnormally low blood pressure.  2. TRICOR, when she took it, caused exacerbation of chronic heartburn     symptoms.   MEDICATIONS:  1. Zantac q.a.m. and q.p.m. if needed.  2. Prevacid q.a.m. and q.p.m. if needed.  3. Aspirin p.r.n.  4. Claritin 10 mg p.r.n.  5. She was on Crestor 10 mg a day, but quit on her own accord thinking that     maybe it was causing myopathy.   PAST MEDICAL HISTORY:  1. Allergic rhinitis.  2. Chronic back pain.  3. Multiple factors causing stress in her life currently.  4. Gastroesophageal reflux disease.   PAST SURGICAL HISTORY:  1. Status post tonsillectomy.  2. Status post nasal polyp resection.  3. Status post right femoral herniorrhaphy.  4. Status post colonoscopy with biopsy of benign polyp.  5. Stress test in September 2003, which was negative.  Ejection fraction     71%.   The patient denies any prior history of hypertension, diabetes, myocardial  infarction, cerebrovascular accident, deep vein thrombosis, pulmonary  embolism, or seizure activity.   SOCIAL HISTORY:  The patient lives in [place] with her husband who has  had a stroke two years ago.  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  She had a remote history of tobacco, having quit 22 years ago.  She  dips snuff currently and chews tobacco.  She does not partake of alcoholic  beverages or recreational drugs.   FAMILY HISTORY:  Mother is living at age 53.  She has advanced ovarian  cancer.  She  has had a history of cerebrovascular accident x2.  Father died  at age 66 of old age.  She has 12 siblings, which include half brothers and  half sisters.  There is an extensive history of myocardial infarction among  them, all of which appeared at _____________age.   REVIEW OF SYSTEMS:  The patient is a rambling historian, but has  constitutionally complained of fever, chills, and night sweats lately.  She  has had seasonal allergies and a complaint of chronic dry mouth.  She has no  rashes or lesions on her integument.  CARDIOPULMONARY:  She is having chest  pain, especially comes on with fatigue in the last six months.  She is not  short of breath and does not complain of dyspnea on exertion.  No orthopnea  or paroxysmal nocturnal dyspnea.  She has had a history of palpitations when  taking Sudafed.  She has no history of syncope, although she complains of  dizziness which has been chronic over several months to a year.  No history  of claudication, cough, or wheezing.  GENITOURINARY:  She does experience  nocturia.  NEUROPSYCHIATRIC:  She is under a lot of stress with anxiety  lately.  Her mother and sister are both gravely ill, and she cares for her  chronically debilitated husband who is status post stroke and coronary  artery bypass graft surgery.  MUSCULOSKELETAL:  Complains of arthralgias in  the knees, fingers, and chronic back pain.  She has had chronic back pain  since 70 years of age.  GASTROINTESTINAL:  Has heartburn symptoms for which  she takes Zantac and Prevacid only a p.r.n. basis.  She has never  experienced bright red blood per rectum, melena, hematemesis.  All other  symptoms negative.  She is not aware of her thyroid status at the present  time.  She does have an elevated serum glucose on this admission.  Glucose  fasting was 144.   PHYSICAL EXAMINATION:  VITAL SIGNS:  Temperature 98.3, pulse of 64 and  regular, respirations 26, blood pressure 91/42. GENERAL:  This  is an alert and oriented female.  She has a nervous  delivery.  She rambles and is quite a poor historian, but what comes through is a  history of chest pain with exertion.  HEENT:  Eyes:  Pupils equal, round, reactive to light.  Extraocular  movements were intact.  Sclerae are clear.  Mucous membranes are moist and  pink without lesion or erythema.  She wears both upper and lower partial  dentures.  NECK:  Supple.  No carotid bruits auscultated.  No jugular venous  distention.  No cervical lymphadenopathy.  HEART:  Regular rate and rhythm with normal S1 and S2.  No murmurs, rubs, or  gallops.  LUNGS:  Clear to auscultation and percussion bilaterally.  INTEGUMENTARY:  No rashes or lesions.  ABDOMEN:  Soft, nontender, mildly obese, no hepatosplenomegaly.  Abdominal  aorta is not palpated.  GENITOURINARY:  Deferred.  RECTAL:  Deferred.  EXTREMITIES:  No evidence of cyanosis, clubbing, or edema.  She has 4/4  pulses in the dorsalis pedis region and 4/4 radial pulses.  MUSCULOSKELETAL:  No joint deformity or effusions.  NEUROLOGIC:  No neurologic deficits noted.   LABORATORY DATA:  Chest x-ray is pending.  Electrocardiogram:  The rate is  64, rhythm is sinus rhythm, axis is 0 degrees, PR interval is 213, QRS is  100, QTC is 413.  No Q-wave noted.  There are inverted T-waves in V1 through  V3.  This is probably a repolarization abnormality since she also has  inverted Q-waves on a March 2004, electrocardiogram taken at the [facility]  office at [facility].  She has incomplete right bundle branch block.   Complete blood count:  White cells are 5, hemoglobin 12.8, hematocrit 37.6,  platelets 271.  Serum electrolytes:  Sodium 142, potassium 3.4, chloride  110, bicarbonate 25, BUN 7, creatinine 0.7, glucose 144, elevated.  Alkaline  phosphatase 61, SGOT 15, SGPT 16.  PT, PTT, and INR are all pending as are  fasting lipid profile and thyroid stimulating hormone as well as HGB A1C.    IMPRESSION:  Atypical chest pain.  Enzymes are negative at point-of-care x3.  Electrocardiogram non-diagnostic, however, Dr. Andee Lineman has questioned the  patient extensively and has reviewed the examination and past medical  history.  He notes that she has had symptoms consistent with exertional  angina which have been persistent for months.  Electrocardiogram shows T-  wave abnormalities in the anterior precordial leads.  Further evaluation of  the patient would consist of cardiac catheterization.  The patient prefers  this method, and we are to proceed with this study on February 24, 2004, as well  as obtaining fasting lipid profile, TSH study, and hemoglobin A1C.  Her  serum blood glucose will be monitored before meals and at bedtime while at  [facility] and further work in this area may be required.  Of note,  also we will place the patient on intravenous heparin, continue intravenous  nitroglycerin.      Maple Mirza, P.A.                    Doug Sou, M.D.    GM/MEDQ  D:  02/23/2004  T:  02/23/2004  Job:  [id number]

## 2010-12-29 NOTE — Discharge Summary (Signed)
NAME:  Taylor Mitchell, Taylor Mitchell                       ACCOUNT NO.:  [id number]   MEDICAL RECORD NO.:  [id number]                   PATIENT TYPE:  INP   LOCATION:  2017                                 FACILITY:  [facility]   PHYSICIAN:  Learta Codding, M.D. [facility]             DATE OF BIRTH:  Jun 30, 1941   DATE OF ADMISSION:  02/23/2004  DATE OF DISCHARGE:  02/24/2004                                 DISCHARGE SUMMARY   DISCHARGE DIAGNOSES:  1. Admitted with chest discomfort in the setting of high stress with     radiation to the shoulders.  2. Troponin I studies are less than 0.05 x 3.  3. Electrocardiogram shows incomplete right bundle branch block with     inverted T-waves in V1 through V3.  Possible repolarization abnormality.     Left heart catheterization February 24, 2004 showed normal coronary anatomy     with minimal coronary plaque and normal left ventricular function.   SECONDARY DIAGNOSES:  1. Allergic rhinitis.  2. Chronic back pain.  3. Multiple stress factors in her life currently.  4. Gastroesophageal reflux disease.  5. Status post tonsillectomy.  6. Status post nasal polyp resection.  7. Status post right femoral herniorrhaphy.  8. Status post colonoscopy with biopsy of benign polyp.  9. Stress tests of September 2003 with negative ejection fraction of 71%.   PROCEDURE:  February 24, 2004, left heart catheterization:  The study shows the  left main was without significant coronary artery disease.  The left  anterior descending artery was without significant disease.  Left circumflex  is dominant.  He has luminal irregularities in the AV groove.  The ramus  intermediate is large and normal.  Obtuse marginal is moderate sized and  normal.  Posterolateral branch is normal.  PDA is small.  Right coronary  artery is nondominant and normal.  Left ventricular ejection fraction is  60%.  No wall motion abnormalities.   HISTORY OF PRESENT ILLNESS:  Taylor Mitchell is a 70 year old female.  She  presents with an acute event of central chest discomfort which started 5  o'clock in the afternoon on February 22, 2004 after the patient had been staying  with her mother throughout the day at [facility].  Her  mother has advanced ovarian cancer.  She also has a sister currently at  [facility] and the patient has been feeling a lot of  stress lately.  She also cares for her husband at home who is debilitated  secondary to a CVA experienced two years ago.   Ms. Capron's chest pain came on recently as the patient was waiting for  dinner.  She had no concomitant symptoms such as diaphoresis, nausea, or  shortness of breath.  She did note that she had some radiation of the pain  across the shoulders.  She noted that the pain was a 5/6 over 10.  She got  dinner and went back to sit by her mother until about 2 o'clock in the  morning of February 23, 2004.   She went back home, slept poorly, awoke to go to the bathroom.  She noted  some residual chest pain, took an aspirin, and obtained only mild relief.  When she woke the morning of February 23, 2004, she was still have chest  discomfort.  She took another aspirin. She also felt some numbness in the  upper extremities and decided to come to the emergency room.  Currently, at  the time of examination, she was on IV nitroglycerin without chest pain.   On questioning by Dr. Learta Codding, cardiologist, she noticed some symptoms  suggestive of exertional angina in the past two to three weeks.  Electrocardiogram shows T-wave changes in the anterior precordial leads.  Because the patient gives a two to three-month history of chest discomfort  which was dull, aching, comes on with exertion, we have decided to admit the  patient and perform left heart catheterization.   HOSPITAL COURSE:  The patient was seen in the emergency room February 23, 2004  with complaint of chest pain with exertional component and some radiation  across  the shoulders.  She was started on IV heparin and scheduled for a  left heart catheterization.  Her chest pain did not return during her stay  at [facility].  Her troponin I studies were negative.  She  subsequent underwent left heart catheterization on February 24, 2004 with the  finding of normal coronary anatomy and preserved left ventricular function.  Dr. Rollene Rotunda performed the study.   DISCHARGE MEDICATIONS:  1. The patient goes home with the medications that she has had prior to this     admission of enteric-coated aspirin 325 mg q.d.  2. Zantac or Prevacid daily in the morning or as needed on a twice a day     basis.  3. Claritin 10 mg q.d. as needed for pain management at the catheterization     site.  4. Tylenol 325 mg 1-2 tablets q.4-6h. p.r.n. pain.   ACTIVITY:  She was asked to avoid heavy lifting or straining for the next  two weeks.  She may begin driving Sunday, February 27, 2004.   DISCHARGE INSTRUCTIONS:  She is able to shower.   FOLLOW UP:  She is to call [phone] if she experiences swelling or increased  pain at the catheterization site.  She has an office visit at [facility] at [facility] with Dr. Jesse Sans. Wall on Thursday,  March 09, 2004 at 3 o'clock in the afternoon.   LABORATORY DATA:  Complete blood count on admission shows white cells 8.8,  hemoglobin 12.2, hematocrit 35.8, platelets 257,000.  Serum electrolytes on  admission show a sodium of 142, potassium 3.4, chloride 110, carbonate 25,  glucose 144, BUN 7, creatinine 0.7, alkaline phosphate 61, SGOT is 15, SGPT  is 16.  The low potassium value on admission was replenished with a one time  dose of 20 mEq of potassium chloride.  The patient also obtained a TSH.  HGB  A1C was 6.2.  TSH was 1.596.  Liver profile showed a cholesterol of 224,  triglycerides 112, HGL cholesterol of 43, LDL cholesterol of 159.  I quote these values because the patient had been on Crestor  in the past and had  declined secondary to a myopathy.  The patient is very resistant to take  medications and perhaps  with the new finding of elevated LDL cholesterol she may be counseled to  rethink her position on the statin drug once she reports to the office on  March 09, 2004.  It would be our plan that she should reinstate a statin  medication for her adverse lipid profile.      Maple Mirza, P.A.                    Learta Codding, M.D. [facility]    GM/MEDQ  D:  02/24/2004  T:  02/24/2004  Job:  [id number]   cc:   Thomas C. Wall, M.D.   Barnabas Lister, M.D.  [place], [place]

## 2010-12-29 NOTE — Cardiovascular Report (Signed)
NAME:  Taylor Mitchell, Taylor Mitchell                       ACCOUNT NO.:  [id number]   MEDICAL RECORD NO.:  [id number]                   PATIENT TYPE:  INP   LOCATION:  2017                                 FACILITY:  [facility]   PHYSICIAN:  Rollene Rotunda, M.D.                DATE OF BIRTH:  1941/06/21   DATE OF PROCEDURE:  02/24/2004  DATE OF DISCHARGE:  02/24/2004                              CARDIAC CATHETERIZATION   PRIMARY CARE PHYSICIAN:  Dr. Annett Fabian in [place].   PROCEDURE:  Left heart catheterization, coronary arteriography.   INDICATION:  Evaluate patient with chest pain suggestive of unstable angina.   PROCEDURE NOTE:  Left heart catheterization was performed via the right  femoral artery.  The artery was cannulated using an anterior wall puncture.  A 6 French arterial sheath was inserted via the modified Seldinger  technique.  Preformed Judkins and a pigtail catheter were utilized.  The  patient tolerated the procedure well and left the lab in stable condition.   RESULTS:   HEMODYNAMICS:  1. LV 135/8.  2. Aortic 135/92.   CORONARIES:  The left main was short and normal.  The LAD was normal.  It  was narrow caliber as it wrapped the apex.  There was a mid diagonal which  was large and normal.  The circumflex was dominant.  There were luminal  irregularities in the AV groove.  Ramus intermediate was large and normal.  Mid obtuse marginal was moderate size and normal.  Posterior lateral was  large, branching and normal and served some of the PDA distribution.  The  PDA proper was small and normal.  The right coronary artery was nondominant  and normal.   LEFT VENTRICULOGRAM:  Left ventriculogram was obtained in the RAO  projection.  The EF was 60% with normal wall motion.   CONCLUSION:  1. Minimal coronary plaque.  2. Normal left ventricular function.   PLAN:  No further cardiac workup is suggested.  The patient will go home  later today.  We will repeat a basic  metabolic profile to make sure  potassium is okay.  She can follow with her primary care doctor for  evaluation of nonanginal chest pain.                                               Rollene Rotunda, M.D.    JH/MEDQ  D:  02/24/2004  T:  02/24/2004  Job:  [id number]   cc:   Dr. Annett Fabian in [place] C. Wall, M.D.

## 2011-01-31 ENCOUNTER — Telehealth: Payer: Self-pay | Admitting: *Deleted

## 2011-01-31 DIAGNOSIS — E739 Lactose intolerance, unspecified: Secondary | ICD-10-CM

## 2011-01-31 DIAGNOSIS — E785 Hyperlipidemia, unspecified: Secondary | ICD-10-CM

## 2011-01-31 NOTE — Telephone Encounter (Signed)
Orders only

## 2011-02-01 LAB — LIPID PANEL
Cholesterol: 288 mg/dL — ABNORMAL HIGH (ref 0–200)
HDL: 50 mg/dL (ref >39)
LDL Cholesterol: 211 mg/dL — ABNORMAL HIGH (ref 0–99)
Total CHOL/HDL Ratio: 5.8 Ratio
Triglycerides: 137 mg/dL (ref <150)
VLDL: 27 mg/dL (ref 0–40)

## 2011-02-01 LAB — HEMOGLOBIN A1C: Hgb A1c MFr Bld: 6.2 % — ABNORMAL HIGH (ref <5.7)

## 2011-02-05 ENCOUNTER — Encounter: Payer: Self-pay | Admitting: Family Medicine

## 2011-02-06 ENCOUNTER — Encounter: Payer: Self-pay | Admitting: Family Medicine

## 2011-02-06 ENCOUNTER — Ambulatory Visit (INDEPENDENT_AMBULATORY_CARE_PROVIDER_SITE_OTHER): Payer: Medicare Other | Admitting: Family Medicine

## 2011-02-06 VITALS — BP 112/74 | HR 55 | Resp 16 | Ht 67.0 in | Wt 168.8 lb

## 2011-02-06 DIAGNOSIS — E785 Hyperlipidemia, unspecified: Secondary | ICD-10-CM

## 2011-02-06 DIAGNOSIS — J309 Allergic rhinitis, unspecified: Secondary | ICD-10-CM

## 2011-02-06 DIAGNOSIS — E739 Lactose intolerance, unspecified: Secondary | ICD-10-CM

## 2011-02-06 DIAGNOSIS — R7301 Impaired fasting glucose: Secondary | ICD-10-CM

## 2011-02-06 DIAGNOSIS — R7309 Other abnormal glucose: Secondary | ICD-10-CM

## 2011-02-06 DIAGNOSIS — R5383 Other fatigue: Secondary | ICD-10-CM

## 2011-02-06 DIAGNOSIS — F411 Generalized anxiety disorder: Secondary | ICD-10-CM

## 2011-02-06 DIAGNOSIS — R7303 Prediabetes: Secondary | ICD-10-CM

## 2011-02-06 MED ORDER — PRAVASTATIN SODIUM 10 MG PO TABS: 10.0000 mg | ORAL_TABLET | Freq: Every evening | ORAL | Status: DC

## 2011-02-06 NOTE — Patient Instructions (Addendum)
F/u in 4 months.  PLS stop the fried foods and biscuits, eat more out of the garden and white meats  Pls attempt to take the pravastatin 10 mg Monday, Wednesday and Friday night only, if any prob withit stop  pls cut down on carbs and sweets.  Fasting lipid, hepatic, chem 7 HBA1C and TSH in 4 months  It is important that you exercise regularly at least 30 minutes 5 times a week. If you develop chest pain, have severe difficulty breathing, or feel very tired, stop exercising immediately and seek medical attention    A healthy diet is rich in fruit, vegetables and whole grains. Poultry fish, nuts and beans are a healthy choice for protein rather then red meat. A low sodium diet and drinking 64 ounces of water daily is generally recommended. Oils and sweet should be limited. Carbohydrates especially for those who are diabetic or overweight, should be limited to 34-45 gram per meal. It is important to eat on a regular schedule, at least 3 times daily. Snacks should be primarily fruits, vegetables or nuts.

## 2011-02-12 ENCOUNTER — Ambulatory Visit (INDEPENDENT_AMBULATORY_CARE_PROVIDER_SITE_OTHER): Payer: Medicare Other | Admitting: Family Medicine

## 2011-02-12 ENCOUNTER — Telehealth: Payer: Self-pay | Admitting: Family Medicine

## 2011-02-12 DIAGNOSIS — R109 Unspecified abdominal pain: Secondary | ICD-10-CM

## 2011-02-12 LAB — POCT URINALYSIS DIPSTICK
Protein, UA: NEGATIVE
Spec Grav, UA: 1.02
Urobilinogen, UA: 0.2
pH, UA: 5

## 2011-02-12 NOTE — Telephone Encounter (Signed)
Please have patient come in as a nurse visit

## 2011-02-12 NOTE — Progress Notes (Signed)
No sign of infection but trace of blood. Will send for culture. Told patient that I would call her if/when we called in something for her

## 2011-02-12 NOTE — Progress Notes (Signed)
  Subjective:    Patient ID: Taylor Mitchell, female    DOB: 09/17/1940, 70 y.o.   MRN: [id number]  HPI    Review of Systems     Objective:   Physical Exam        Assessment & Plan:  Not as symptomatic as 1 day ago, will await c/s

## 2011-02-14 LAB — URINE CULTURE: Colony Count: 8000

## 2011-02-16 ENCOUNTER — Encounter: Payer: Self-pay | Admitting: Family Medicine

## 2011-02-16 DIAGNOSIS — E1169 Type 2 diabetes mellitus with other specified complication: Secondary | ICD-10-CM | POA: Insufficient documentation

## 2011-02-16 DIAGNOSIS — R7303 Prediabetes: Secondary | ICD-10-CM | POA: Insufficient documentation

## 2011-02-16 NOTE — Progress Notes (Signed)
  Subjective:    Patient ID: Taylor Mitchell, female    DOB: 1940/10/10, 70 y.o.   MRN: [id number]  HPI The PT is here for follow up and re-evaluation of chronic medical conditions, medication management and review of recent lab and radiology data.  Preventive health is updated, specifically  Cancer screening, and Immunization.   Questions or concerns regarding consultations or procedures which the PT has had in the interim are  addressed. The PT denies any adverse reactions to current medications since the last visit.  She reports increased allergy symptoms which are responding to flonase. She states she has not been as diligent with her diet and has gained weight as a result and expects that her blood sugar and cholesterol have worsened, both are correct    Review of Systems Denies recent fever or chills. Denies sinus pressure, nasal congestion, ear pain or sore throat. Denies chest congestion, productive cough or wheezing. Denies chest pains, palpitations, paroxysmal nocturnal dyspnea, orthopnea and leg swelling Denies abdominal pain, nausea, vomiting,diarrhea or constipation.  Denies rectal bleeding or change in bowel movement. Denies dysuria, frequency, hesitancy or incontinence. Denies joint pain, swelling and limitation in mobility. Denies headaches, seizure, numbness, or tingling. Denies depression, anxiety or insomnia. Denies skin break down or rash.        Objective:   Physical Exam Patient alert and oriented and in no Cardiopulmonary distress.  HEENT: No facial asymmetry, EOMI, no sinus tenderness, TM's clear, Oropharynx pink and moist.  Neck supple no adenopathy.  Chest: Clear to auscultation bilaterally.  CVS: S1, S2 no murmurs, no S3.  ABD: Soft non tender. Bowel sounds normal.  Ext: No edema  MS: Adequate ROM spine, shoulders, hips and knees.  Skin: Intact, no ulcerations or rash noted.  Psych: Good eye contact, normal affect. Memory intact not anxious or  depressed appearing.  CNS: CN 2-12 intact, power, tone and sensation normal throughout.        Assessment & Plan:

## 2011-02-16 NOTE — Assessment & Plan Note (Signed)
Deteriorated, pt states her diet has been poor in recent times, still plans to manage with diet only

## 2011-02-16 NOTE — Assessment & Plan Note (Signed)
Deteriorated, low fat diet discussed and encouraged, statin intolerant , but will try low dose of med alternate nights

## 2011-02-16 NOTE — Assessment & Plan Note (Signed)
Controlled, no change in medication  

## 2011-02-16 NOTE — Assessment & Plan Note (Signed)
Increased symptoms in the past 1  Month, using flonase with success

## 2011-05-01 ENCOUNTER — Ambulatory Visit: Payer: Self-pay | Admitting: Family Medicine

## 2011-05-11 ENCOUNTER — Encounter: Payer: Self-pay | Admitting: Family Medicine

## 2011-06-07 ENCOUNTER — Other Ambulatory Visit: Payer: Self-pay | Admitting: Family Medicine

## 2011-06-07 ENCOUNTER — Encounter: Payer: Self-pay | Admitting: Family Medicine

## 2011-06-08 LAB — HEMOGLOBIN A1C
Hgb A1c MFr Bld: 5.8 % — ABNORMAL HIGH (ref <5.7)
Mean Plasma Glucose: 120 mg/dL — ABNORMAL HIGH (ref <117)

## 2011-06-08 LAB — BASIC METABOLIC PANEL
BUN: 21 mg/dL (ref 6–23)
Potassium: 4.2 mEq/L (ref 3.5–5.3)
Sodium: 140 mEq/L (ref 135–145)

## 2011-06-08 LAB — LIPID PANEL
Cholesterol: 291 mg/dL — ABNORMAL HIGH (ref 0–200)
HDL: 46 mg/dL (ref >39)
Total CHOL/HDL Ratio: 6.3 Ratio
Triglycerides: 106 mg/dL (ref <150)
VLDL: 21 mg/dL (ref 0–40)

## 2011-06-08 LAB — HEPATIC FUNCTION PANEL
ALT: 16 U/L (ref 0–35)
Total Protein: 6.9 g/dL (ref 6.0–8.3)

## 2011-06-11 ENCOUNTER — Ambulatory Visit (INDEPENDENT_AMBULATORY_CARE_PROVIDER_SITE_OTHER): Payer: Medicare Other | Admitting: Family Medicine

## 2011-06-11 ENCOUNTER — Encounter: Payer: Self-pay | Admitting: Family Medicine

## 2011-06-11 VITALS — BP 118/80 | HR 58 | Resp 16 | Ht 67.0 in | Wt 162.8 lb

## 2011-06-11 DIAGNOSIS — J309 Allergic rhinitis, unspecified: Secondary | ICD-10-CM

## 2011-06-11 DIAGNOSIS — R6889 Other general symptoms and signs: Secondary | ICD-10-CM

## 2011-06-11 DIAGNOSIS — F411 Generalized anxiety disorder: Secondary | ICD-10-CM

## 2011-06-11 DIAGNOSIS — E785 Hyperlipidemia, unspecified: Secondary | ICD-10-CM

## 2011-06-11 DIAGNOSIS — R7303 Prediabetes: Secondary | ICD-10-CM

## 2011-06-11 DIAGNOSIS — R7989 Other specified abnormal findings of blood chemistry: Secondary | ICD-10-CM

## 2011-06-11 DIAGNOSIS — R7309 Other abnormal glucose: Secondary | ICD-10-CM

## 2011-06-11 DIAGNOSIS — R5383 Other fatigue: Secondary | ICD-10-CM

## 2011-06-11 DIAGNOSIS — R5381 Other malaise: Secondary | ICD-10-CM

## 2011-06-11 NOTE — Assessment & Plan Note (Signed)
Improved HBA1C with dietary change and weight loss

## 2011-06-11 NOTE — Progress Notes (Signed)
  Subjective:    Patient ID: Taylor Mitchell, female    DOB: July 24, 1941, 70 y.o.   MRN: [id number]  HPI The PT is here for follow up and re-evaluation of chronic medical conditions, medication management and review of any available recent lab and radiology data.Pt has been eating an excessive amt of eggs and is statin intolerant, as well as niaspan and tricor, her lipids have worsened Preventive health is updated, specifically  Cancer screening and Immunization.   Questions or concerns regarding consultations or procedures which the PT has had in the interim are  addressed. The PT denies any adverse reactions to current medications since the last visit.  There are no new concerns.  There are no specific complaints       Review of Systems See HPI Denies recent fever or chills. Denies sinus pressure, nasal congestion, ear pain or sore throat. Denies chest congestion, productive cough or wheezing. Denies chest pains, palpitations and leg swelling Denies abdominal pain, nausea, vomiting,diarrhea or constipation.   Denies dysuria, frequency, hesitancy or incontinence. Denies joint pain, swelling and limitation in mobility. Denies headaches, seizures, numbness, or tingling. Denies depression, anxiety or insomnia. Denies skin break down or rash.        Objective:   Physical Exam Patient alert and oriented and in no cardiopulmonary distress.  HEENT: No facial asymmetry, EOMI, no sinus tenderness,  oropharynx pink and moist.  Neck supple no adenopathy.Goiter  Chest: Clear to auscultation bilaterally.  CVS: S1, S2 no murmurs, no S3.  ABD: Soft non tender. Bowel sounds normal.  Ext: No edema  MS: Adequate ROM spine, shoulders, hips and knees.  Skin: Intact, no ulcerations or rash noted.  Psych: Good eye contact, normal affect. Memory intact not anxious or depressed appearing.  CNS: CN 2-12 intact, power, tone and sensation normal throughout.        Assessment & Plan:

## 2011-06-11 NOTE — Patient Instructions (Addendum)
F/u in 4.5 months.  Your blood sugar has improved, congrats.  PLS stop eating egg yolks and cut back on red meat, fried and fatty foods.  Your cholesterol is higher. Take fish oil one twice daily  It is important that you exercise regularly at least 30 minutes 5 times a week. If you develop chest pain, have severe difficulty breathing, or feel very tired, stop exercising immediately and seek medical attention    Flu vaccine today.  You are referred for an [place] of your thyroid gland  Fasting labns oin 4.5 month: HBA1C, TSH and lipid panel and cbc

## 2011-06-11 NOTE — Assessment & Plan Note (Signed)
Worsened. Pt to discontinue egg yolks and also to start fish oil regularly as well as exercise

## 2011-06-11 NOTE — Assessment & Plan Note (Signed)
Controlled, no change in medication  

## 2011-06-11 NOTE — Assessment & Plan Note (Signed)
Will rept level in 4 months,Pt has goiter, [place] of gland will be obtained

## 2011-06-13 ENCOUNTER — Ambulatory Visit (HOSPITAL_COMMUNITY)
Admission: RE | Admit: 2011-06-13 | Discharge: 2011-06-13 | Disposition: A | Discharge status: Home / Self Care | Payer: Medicare Other | Source: Ambulatory Visit | Attending: Family Medicine | Admitting: Family Medicine

## 2011-06-13 DIAGNOSIS — R7989 Other specified abnormal findings of blood chemistry: Secondary | ICD-10-CM

## 2011-06-13 DIAGNOSIS — E049 Nontoxic goiter, unspecified: Secondary | ICD-10-CM | POA: Insufficient documentation

## 2011-10-16 ENCOUNTER — Other Ambulatory Visit: Payer: Self-pay

## 2011-10-16 MED ORDER — GLUCOSE BLOOD VI STRP: ORAL_STRIP | Status: DC

## 2011-10-24 LAB — CBC WITH DIFFERENTIAL/PLATELET
Basophils Absolute: 0.1 10*3/uL (ref 0.0–0.1)
Basophils Relative: 1 % (ref 0–1)
HCT: 43.7 % (ref 36.0–46.0)
Lymphocytes Relative: 42 % (ref 12–46)
Neutro Abs: 3.3 10*3/uL (ref 1.7–7.7)
Neutrophils Relative %: 48 % (ref 43–77)
Platelets: 239 10*3/uL (ref 150–400)
RDW: 13.6 % (ref 11.5–15.5)
WBC: 6.9 10*3/uL (ref 4.0–10.5)

## 2011-10-24 LAB — TSH: TSH: 4.888 u[IU]/mL — ABNORMAL HIGH (ref 0.350–4.500)

## 2011-10-24 LAB — LIPID PANEL
LDL Cholesterol: 224 mg/dL — ABNORMAL HIGH (ref 0–99)
Triglycerides: 150 mg/dL — ABNORMAL HIGH (ref <150)

## 2011-10-24 LAB — HEMOGLOBIN A1C
Hgb A1c MFr Bld: 6 % — ABNORMAL HIGH (ref <5.7)
Mean Plasma Glucose: 126 mg/dL — ABNORMAL HIGH (ref <117)

## 2011-10-25 ENCOUNTER — Ambulatory Visit (INDEPENDENT_AMBULATORY_CARE_PROVIDER_SITE_OTHER): Payer: Medicare Other | Admitting: Family Medicine

## 2011-10-25 ENCOUNTER — Encounter: Payer: Self-pay | Admitting: Family Medicine

## 2011-10-25 VITALS — BP 112/72 | HR 52 | Resp 16 | Ht 67.0 in | Wt 165.0 lb

## 2011-10-25 DIAGNOSIS — E785 Hyperlipidemia, unspecified: Secondary | ICD-10-CM

## 2011-10-25 DIAGNOSIS — R7301 Impaired fasting glucose: Secondary | ICD-10-CM

## 2011-10-25 DIAGNOSIS — E039 Hypothyroidism, unspecified: Secondary | ICD-10-CM

## 2011-10-25 DIAGNOSIS — R7989 Other specified abnormal findings of blood chemistry: Secondary | ICD-10-CM | POA: Insufficient documentation

## 2011-10-25 DIAGNOSIS — R7309 Other abnormal glucose: Secondary | ICD-10-CM

## 2011-10-25 DIAGNOSIS — R7303 Prediabetes: Secondary | ICD-10-CM

## 2011-10-25 MED ORDER — CHOLINE FENOFIBRATE 135 MG PO CPDR: DELAYED_RELEASE_CAPSULE | ORAL | Status: DC

## 2011-10-25 MED ORDER — LEVOTHYROXINE SODIUM 25 MCG PO TABS: 25.0000 ug | ORAL_TABLET | Freq: Every day | ORAL | Status: DC

## 2011-10-25 NOTE — Progress Notes (Signed)
  Subjective:    Patient ID: Taylor Mitchell, female    DOB: [birth year], 72 y.o.   MRN: [id number]  HPI The PT is here for follow up and re-evaluation of chronic medical conditions, medication management and review of any available recent lab and radiology data.  Preventive health is updated, specifically  Cancer screening and Immunization.   Questions or concerns regarding consultations or procedures which the PT has had in the interim are  addressed. The PT denies any adverse reactions to current medications since the last visit.  There are no new concerns.  There are no specific complaints       Review of Systems See HPI Denies recent fever or chills. Denies sinus pressure, nasal congestion, ear pain or sore throat. Denies chest congestion, productive cough or wheezing. Denies chest pains, palpitations and leg swelling Denies abdominal pain, nausea, vomiting,diarrhea or constipation.   Denies dysuria, frequency, hesitancy or incontinence. Denies joint pain, swelling and limitation in mobility. Denies headaches, seizures, numbness, or tingling. Denies depression, anxiety or insomnia. Denies skin break down or rash.        Objective:   Physical Exam  Patient alert and oriented and in no cardiopulmonary distress.  HEENT: No facial asymmetry, EOMI, no sinus tenderness,  oropharynx pink and moist.  Neck supple no adenopathy.  Chest: Clear to auscultation bilaterally.  CVS: S1, S2 no murmurs, no S3.  ABD: Soft non tender. Bowel sounds normal.  Ext: No edema  MS: Adequate ROM spine, shoulders, hips and knees.  Skin: Intact, no ulcerations or rash noted.  Psych: Good eye contact, normal affect. Memory intact not anxious or depressed appearing.  CNS: CN 2-12 intact, power, tone and sensation normal throughout.       Assessment & Plan:

## 2011-10-25 NOTE — Patient Instructions (Addendum)
F/u in 6 month.  Please change diet so your cholesterol improves, please stop eating the yellow of eggs  You will get 14 tablets of trilipx for cholesterol, take one every Monday, Wednesday and Friday night. If you tolerate this call so we can prescribe it for you. This is an excellent drug for cholesterol  Please cut back on bread and carbs and start regular exercise to improve your blood sugar.  You will start a low dose of thyroid medication since your gland is slightly underactive  TSH in 3 month  Fasting lipid, HBa1C in 78month 3 weeks

## 2011-10-26 NOTE — Assessment & Plan Note (Signed)
Pt to start a low dose of thyroid medication

## 2011-10-26 NOTE — Assessment & Plan Note (Signed)
Deteriorated. Diet discussed and pt will have a trial of trilipix 3 times per week, samples provided initially

## 2011-10-26 NOTE — Assessment & Plan Note (Signed)
Deteriorated, the importance of low carb diet stressed, and commitment to regular physical activity

## 2012-01-24 ENCOUNTER — Encounter: Payer: Self-pay | Admitting: Family Medicine

## 2012-01-24 ENCOUNTER — Ambulatory Visit (INDEPENDENT_AMBULATORY_CARE_PROVIDER_SITE_OTHER): Payer: Medicare Other | Admitting: Family Medicine

## 2012-01-24 VITALS — BP 130/82 | HR 67 | Resp 18 | Ht 67.0 in | Wt 167.0 lb

## 2012-01-24 DIAGNOSIS — T148XXA Other injury of unspecified body region, initial encounter: Secondary | ICD-10-CM

## 2012-01-24 DIAGNOSIS — T148 Other injury of unspecified body region: Secondary | ICD-10-CM

## 2012-01-24 DIAGNOSIS — W57XXXA Bitten or stung by nonvenomous insect and other nonvenomous arthropods, initial encounter: Secondary | ICD-10-CM

## 2012-01-24 DIAGNOSIS — E785 Hyperlipidemia, unspecified: Secondary | ICD-10-CM

## 2012-01-24 MED ORDER — DOXYCYCLINE HYCLATE 100 MG PO TABS: 100.0000 mg | ORAL_TABLET | Freq: Two times a day (BID) | ORAL | Status: AC

## 2012-01-24 NOTE — Patient Instructions (Addendum)
F/u as before.  Doxycycline is sent to your pharmacy.  Use benadryl cream or tablet for help with itching

## 2012-01-24 NOTE — Progress Notes (Signed)
  Subjective:    Patient ID: Taylor Mitchell, female    DOB: [birth year], 71 y.o.   MRN: [id number]  HPI Pt in with concerns of multiple tick bites in the past several days some of which appear infected and are painful.She denies fever, chills or purulent drainage    Review of Systems See HPI Denies recent fever or chills. Denies sinus pressure, nasal congestion, ear pain or sore throat. Denies chest congestion, productive cough or wheezing.  Denies abdominal pain, nausea, vomiting,diarrhea or constipation.   Denies dysuria, frequency, hesitancy or incontinence. Denies joint pain, swelling and limitation in mobility. Denies headaches, seizures, numbness, or tingling. Denies uncontrolled depression, anxiety or insomnia.        Objective:   Physical Exam Patient alert and oriented and in no cardiopulmonary distress.  HEENT: No facial asymmetry, EOMI, no sinus tenderness,  oropharynx pink and moist.  Neck supple no adenopathy.  Chest: Clear to auscultation bilaterally.  CVS: S1, S2 no murmurs, no S3.  ABD: Soft non tender. Bowel sounds normal.  Ext: No edema  MS: Adequate ROM spine, shoulders, hips and knees.  Skin: Erythema and tick bite  Psych: Good eye contact, normal affect. Memory intact not anxious or depressed appearing.  CNS: CN 2-12 intact, power, tone and sensation normal throughout.        Assessment & Plan:

## 2012-01-27 NOTE — Assessment & Plan Note (Signed)
Needs updated labs.

## 2012-01-27 NOTE — Assessment & Plan Note (Signed)
Multiple tick bites with infection, doxycycline prescribed

## 2012-04-15 ENCOUNTER — Other Ambulatory Visit: Payer: Self-pay | Admitting: Family Medicine

## 2012-04-16 LAB — LIPID PANEL
HDL: 53 mg/dL (ref >39)
Total CHOL/HDL Ratio: 5.1 Ratio
Triglycerides: 144 mg/dL (ref <150)

## 2012-04-16 LAB — HEMOGLOBIN A1C: Hgb A1c MFr Bld: 6.2 % — ABNORMAL HIGH (ref <5.7)

## 2012-04-17 ENCOUNTER — Telehealth: Payer: Self-pay | Admitting: Family Medicine

## 2012-04-17 ENCOUNTER — Ambulatory Visit (INDEPENDENT_AMBULATORY_CARE_PROVIDER_SITE_OTHER): Payer: Medicare Other | Admitting: Family Medicine

## 2012-04-17 ENCOUNTER — Encounter: Payer: Self-pay | Admitting: Family Medicine

## 2012-04-17 VITALS — BP 120/60 | HR 60 | Resp 18 | Ht 67.0 in | Wt 174.1 lb

## 2012-04-17 DIAGNOSIS — R7303 Prediabetes: Secondary | ICD-10-CM

## 2012-04-17 DIAGNOSIS — Z0000 Encounter for general adult medical examination without abnormal findings: Secondary | ICD-10-CM

## 2012-04-17 DIAGNOSIS — E785 Hyperlipidemia, unspecified: Secondary | ICD-10-CM

## 2012-04-17 DIAGNOSIS — Z23 Encounter for immunization: Secondary | ICD-10-CM

## 2012-04-17 DIAGNOSIS — R7309 Other abnormal glucose: Secondary | ICD-10-CM

## 2012-04-17 DIAGNOSIS — E039 Hypothyroidism, unspecified: Secondary | ICD-10-CM

## 2012-04-17 NOTE — Patient Instructions (Addendum)
Pelvic and breast exam in December   It is important that you exercise regularly at least 30 minutes 5 times a week. If you develop chest pain, have severe difficulty breathing, or feel very tired, stop exercising immediately and seek medical attention   A healthy diet is rich in fruit, vegetables and whole grains. Poultry fish, nuts and beans are a healthy choice for protein rather then red meat. A low sodium diet and drinking 64 ounces of water daily is generally recommended. Oils and sweet should be limited. Carbohydrates especially for those who are diabetic or overweight, should be limited to 34-45 gram per meal. It is important to eat on a regular schedule, at least 3 times daily. Snacks should be primarily fruits, vegetables or nuts.   Cholesterol is improved, you need to work on weight loss

## 2012-04-17 NOTE — Progress Notes (Signed)
Subjective:    Patient ID: Taylor Mitchell, female    DOB: 26-Nov-1940, 71 y.o.   MRN: [id number]  HPI Preventive Screening-Counseling & Management   Patient present here today for a Medicare annual wellness visit. Recent labs also reviewed. She has no specific health concerns   Current Problems (verified)   Medications Prior to Visit Allergies (verified)   PAST HISTORY  Family History  Social History Married, retired ,no alcohol or drug use, including nicotine   Risk Factors  Current exercise habits:  Physically active around the house , needs to commit to 30 minutes daily for health  Dietary issues discussed:importance of low carb diet with reduced fried and fatty foods   Cardiac risk factors: hyperlipidemia and prediabetes  Depression Screen  (Note: if answer to either of the following is "Yes", a more complete depression screening is indicated)   Over the past two weeks, have you felt down, depressed or hopeless? No  Over the past two weeks, have you felt little interest or pleasure in doing things? No  Have you lost interest or pleasure in daily life? No  Do you often feel hopeless? No  Do you cry easily over simple problems? No   Activities of Daily Living  In your present state of health, do you have any difficulty performing the following activities?  Driving?: No Managing money?: No Feeding yourself?:No Getting from bed to chair?:No Climbing a flight of stairs?:No Preparing food and eating?:No Bathing or showering?:No Getting dressed?:No Getting to the toilet?:No Using the toilet?:No Moving around from place to place?: No  Fall Risk Assessment In the past year have you fallen or had a near fall?:No Are you currently taking any medications that make you dizziness?:No   Hearing Difficulties: No Do you often ask people to speak up or repeat themselves?:No Do you experience ringing or noises in your ears?:No Do you have difficulty understanding soft or  whispered voices?:No  Cognitive Testing  Alert? Yes Normal Appearance?Yes  Oriented to person? Yes Place? Yes  Time? Yes  Displays appropriate judgment?Yes  Can read the correct time from a watch face? yes Are you having problems remembering things?No  Advanced Directives have been discussed with the patient?Yes    List the Names of Other Physician/Practitioners you currently use:    Indicate any recent Medical Services you may have received from other than [facility] providers in the past year (date may be approximate). none  Assessment:    Annual Wellness Exam   Plan:    During the course of the visit the patient was educated and counseled about appropriate screening and preventive services including:  A healthy diet is rich in fruit, vegetables and whole grains. Poultry fish, nuts and beans are a healthy choice for protein rather then red meat. A low sodium diet and drinking 64 ounces of water daily is generally recommended. Oils and sweet should be limited. Carbohydrates especially for those who are diabetic or overweight, should be limited to 30-45 gram per meal. It is important to eat on a regular schedule, at least 3 times daily. Snacks should be primarily fruits, vegetables or nuts. It is important that you exercise regularly at least 30 minutes 5 times a week. If you develop chest pain, have severe difficulty breathing, or feel very tired, stop exercising immediately and seek medical attention  Immunization reviewed and updated. Cancer screening reviewed and updated    Patient Instructions (the written plan) was given to the patient.  Medicare Attestation  I have  personally reviewed:  The patient's medical and social history  Their use of alcohol, tobacco or illicit drugs  Their current medications and supplements  The patient's functional ability including ADLs,fall risks, home safety risks, cognitive, and hearing and visual impairment  Diet and physical activities    Evidence for depression or mood disorders  The patient's weight, height, BMI, and visual acuity have been recorded in the chart. I have made referrals, counseling, and provided education to the patient based on review of the above and I have provided the patient with a written personalized care plan for preventive services.      Review of Systems     Objective:   Physical Exam        Assessment & Plan:

## 2012-04-19 DIAGNOSIS — Z0000 Encounter for general adult medical examination without abnormal findings: Secondary | ICD-10-CM | POA: Insufficient documentation

## 2012-04-19 NOTE — Assessment & Plan Note (Signed)
Asymptomatic off meds, prefers watchful management at this time

## 2012-04-19 NOTE — Assessment & Plan Note (Signed)
Deteriorated, HBa1C has increased and pt has gained weight.Pt to recommit to weight loss and regular exercise

## 2012-04-19 NOTE — Assessment & Plan Note (Signed)
Annual exam done at this visit and documented. Pt currently fully functional, no memory disturbance , no h/o falls or instability, not depressed. She intends to commit  to reducing carb intake regular physical activity and continued low fat diet to improve health. Currently wishes all ressucitative measures to be put in place in the event of an arrest, which , based on her level of functioning is appropriate

## 2012-04-19 NOTE — Assessment & Plan Note (Signed)
Improved with dietary change, reduced egg intake. Intolerant of meds

## 2012-05-22 ENCOUNTER — Ambulatory Visit: Payer: Self-pay | Admitting: Family Medicine

## 2012-06-02 ENCOUNTER — Ambulatory Visit: Payer: Self-pay | Admitting: Family Medicine

## 2012-06-04 ENCOUNTER — Ambulatory Visit: Payer: Self-pay | Admitting: Unknown Physician Specialty

## 2012-06-04 LAB — CREATININE, SERUM
Creatinine: 0.66 mg/dL (ref 0.60–1.30)
EGFR (African American): >60
EGFR (Non-African Amer.): >60

## 2012-06-06 ENCOUNTER — Encounter: Payer: Self-pay | Admitting: Family Medicine

## 2012-08-15 ENCOUNTER — Other Ambulatory Visit (HOSPITAL_COMMUNITY)
Admission: RE | Admit: 2012-08-15 | Discharge: 2012-08-15 | Disposition: A | Discharge status: Home / Self Care | Payer: Medicare Other | Source: Ambulatory Visit | Attending: Family Medicine | Admitting: Family Medicine

## 2012-08-15 ENCOUNTER — Ambulatory Visit (INDEPENDENT_AMBULATORY_CARE_PROVIDER_SITE_OTHER): Payer: Medicare Other | Admitting: Family Medicine

## 2012-08-15 ENCOUNTER — Encounter: Payer: Self-pay | Admitting: Family Medicine

## 2012-08-15 ENCOUNTER — Other Ambulatory Visit: Payer: Self-pay | Admitting: Family Medicine

## 2012-08-15 VITALS — BP 120/74 | HR 74 | Resp 16 | Wt 179.0 lb

## 2012-08-15 DIAGNOSIS — R5381 Other malaise: Secondary | ICD-10-CM

## 2012-08-15 DIAGNOSIS — Z124 Encounter for screening for malignant neoplasm of cervix: Secondary | ICD-10-CM

## 2012-08-15 DIAGNOSIS — R7989 Other specified abnormal findings of blood chemistry: Secondary | ICD-10-CM

## 2012-08-15 DIAGNOSIS — Z1211 Encounter for screening for malignant neoplasm of colon: Secondary | ICD-10-CM

## 2012-08-15 DIAGNOSIS — E785 Hyperlipidemia, unspecified: Secondary | ICD-10-CM

## 2012-08-15 DIAGNOSIS — R6889 Other general symptoms and signs: Secondary | ICD-10-CM

## 2012-08-15 DIAGNOSIS — R7309 Other abnormal glucose: Secondary | ICD-10-CM

## 2012-08-15 DIAGNOSIS — R7303 Prediabetes: Secondary | ICD-10-CM

## 2012-08-15 DIAGNOSIS — Z0000 Encounter for general adult medical examination without abnormal findings: Secondary | ICD-10-CM

## 2012-08-15 DIAGNOSIS — R7301 Impaired fasting glucose: Secondary | ICD-10-CM

## 2012-08-15 DIAGNOSIS — Z1151 Encounter for screening for human papillomavirus (HPV): Secondary | ICD-10-CM | POA: Insufficient documentation

## 2012-08-15 DIAGNOSIS — Z01419 Encounter for gynecological examination (general) (routine) without abnormal findings: Secondary | ICD-10-CM | POA: Insufficient documentation

## 2012-08-15 DIAGNOSIS — E559 Vitamin D deficiency, unspecified: Secondary | ICD-10-CM

## 2012-08-15 LAB — COMPREHENSIVE METABOLIC PANEL
ALT: 16 U/L (ref 0–35)
AST: 14 U/L (ref 0–37)
Alkaline Phosphatase: 65 U/L (ref 39–117)
CO2: 26 mEq/L (ref 19–32)
Creat: 0.77 mg/dL (ref 0.50–1.10)
Sodium: 141 mEq/L (ref 135–145)
Total Bilirubin: 0.7 mg/dL (ref 0.3–1.2)
Total Protein: 7.2 g/dL (ref 6.0–8.3)

## 2012-08-15 LAB — LIPID PANEL
HDL: 53 mg/dL (ref >39)
LDL Cholesterol: 244 mg/dL — ABNORMAL HIGH (ref 0–99)
Total CHOL/HDL Ratio: 6.2 Ratio
Triglycerides: 155 mg/dL — ABNORMAL HIGH (ref <150)
VLDL: 31 mg/dL (ref 0–40)

## 2012-08-15 LAB — HEMOGLOBIN A1C: Hgb A1c MFr Bld: 6.4 % — ABNORMAL HIGH (ref <5.7)

## 2012-08-15 NOTE — Patient Instructions (Addendum)
Annual wellness in 5 month, please call if you need me before  All the best for 2014!   Labs today, lipid, cmp, HBA1C and vit D  Non fasting HBA1C ancd CBC in 5 month  It is important that you exercise regularly at least 30 minutes 5 times a week. If you develop chest pain, have severe difficulty breathing, or feel very tired, stop exercising immediately and seek medical attention    A healthy diet is rich in fruit, vegetables and whole grains. Poultry fish, nuts and beans are a healthy choice for protein rather then red meat. A low sodium diet and drinking 64 ounces of water daily is generally recommended. Oils and sweet should be limited. Carbohydrates especially for those who are diabetic or overweight, should be limited to 34-45 gram per meal. It is important to eat on a regular schedule, at least 3 times daily. Snacks should be primarily fruits, vegetables or nuts.

## 2012-08-15 NOTE — Progress Notes (Signed)
  Subjective:    Patient ID: Taylor Mitchell, female    DOB: Jun 21, 1941, 72 y.o.   MRN: [id number]  HPI  Pt in for pelvic and breast exam.States she has had a colonoscopy i the past approx 5 years, this will be obtained He has no concerns at this visit. She reports dietary indiscretion and insufficient physical activity in the past several months, and states sh will change this, she is prediabetic and has severe hyperlipidemia, and is on no medication for either by choice as well as intolerance to statins  Review of Systems See HPI Denies recent fever or chills. Denies sinus pressure, nasal congestion, ear pain or sore throat. Denies chest congestion, productive cough or wheezing. Denies chest pains, palpitations and leg swelling Denies abdominal pain, nausea, vomiting,diarrhea or constipation.   Denies dysuria, frequency, hesitancy or incontinence. Denies joint pain, swelling and limitation in mobility. Denies headaches, seizures, numbness, or tingling. Denies depression, anxiety or insomnia. Denies skin break down or rash.        Objective:   Physical Exam  .Pleasant well nourished female, alert and oriented x 3, in no cardio-pulmonary distress. Afebrile. HEENT No facial trauma or asymetry. Sinuses non tender.  EOMI, External ears normal, tympanic membranes clear. Oropharynx moist, no exudate, fair  dentition. Neck: supple, no adenopathy,JVD or thyromegaly.No bruits.  Chest: Clear to ascultation bilaterally.No crackles or wheezes. Non tender to palpation  Breast: No asymetry,no masses. No nipple discharge or inversion.normal skin exam No axillary or supraclavicular adenopathy  Cardiovascular system; Heart sounds normal,  S1 and  S2 ,no S3.  No murmur, or thrill. Apical beat not displaced Peripheral pulses normal.  Abdomen: Soft, non tender, no organomegaly or masses.  Rectal:  No mass. Guaiac negative stool.  GU: External genitalia normal. No lesions.Female  distribution of hair.  Vaginal canal atrophic and erythematous.Scant  discharge. Uterus normal size, no adnexal masses, no cervical motion or adnexal tenderness.  Musculoskeletal exam: Full ROM of spine, hips , shoulders and knees. No deformity ,swelling or crepitus noted. No muscle wasting or atrophy.   Neurologic: Cranial nerves 2 to 12 intact. Power,  normal throughout. No disturbance in gait. No tremor.  Skin: Intact,   Psych; Normal mood and affect. Judgement and concentration normal        Assessment & Plan:

## 2012-08-16 ENCOUNTER — Telehealth: Payer: Self-pay | Admitting: Family Medicine

## 2012-08-16 LAB — VITAMIN D 25 HYDROXY (VIT D DEFICIENCY, FRACTURES): Vit D, 25-Hydroxy: 31 ng/mL (ref 30–89)

## 2012-08-16 NOTE — Assessment & Plan Note (Signed)
Pelvic and breast exam completed as documented. Discussed the importance of commitment to healthy diet and daily physical activity to improve health

## 2012-08-16 NOTE — Telephone Encounter (Signed)
pls add TSH to recent la dx abn tsh

## 2012-08-18 NOTE — Telephone Encounter (Signed)
Lab test added

## 2012-08-21 NOTE — Addendum Note (Signed)
Addended by: Abner Greenspan on: 08/21/2012 04:33 PM   Modules accepted: Orders

## 2012-12-20 ENCOUNTER — Emergency Department (HOSPITAL_COMMUNITY): Payer: Medicare Other

## 2012-12-20 ENCOUNTER — Emergency Department (HOSPITAL_COMMUNITY)
Admission: EM | Admit: 2012-12-20 | Discharge: 2012-12-21 | Disposition: A | Discharge status: Home / Self Care | Payer: Medicare Other | Attending: Emergency Medicine | Admitting: Emergency Medicine

## 2012-12-20 ENCOUNTER — Encounter (HOSPITAL_COMMUNITY): Payer: Self-pay | Admitting: *Deleted

## 2012-12-20 DIAGNOSIS — Z872 Personal history of diseases of the skin and subcutaneous tissue: Secondary | ICD-10-CM | POA: Insufficient documentation

## 2012-12-20 DIAGNOSIS — Y9389 Activity, other specified: Secondary | ICD-10-CM | POA: Insufficient documentation

## 2012-12-20 DIAGNOSIS — Z7982 Long term (current) use of aspirin: Secondary | ICD-10-CM | POA: Insufficient documentation

## 2012-12-20 DIAGNOSIS — Z79899 Other long term (current) drug therapy: Secondary | ICD-10-CM | POA: Insufficient documentation

## 2012-12-20 DIAGNOSIS — Z8709 Personal history of other diseases of the respiratory system: Secondary | ICD-10-CM | POA: Insufficient documentation

## 2012-12-20 DIAGNOSIS — S9030XA Contusion of unspecified foot, initial encounter: Secondary | ICD-10-CM | POA: Insufficient documentation

## 2012-12-20 DIAGNOSIS — Z8639 Personal history of other endocrine, nutritional and metabolic disease: Secondary | ICD-10-CM | POA: Insufficient documentation

## 2012-12-20 DIAGNOSIS — Z8659 Personal history of other mental and behavioral disorders: Secondary | ICD-10-CM | POA: Insufficient documentation

## 2012-12-20 DIAGNOSIS — S9032XA Contusion of left foot, initial encounter: Secondary | ICD-10-CM

## 2012-12-20 DIAGNOSIS — Z8669 Personal history of other diseases of the nervous system and sense organs: Secondary | ICD-10-CM | POA: Insufficient documentation

## 2012-12-20 DIAGNOSIS — Z87891 Personal history of nicotine dependence: Secondary | ICD-10-CM | POA: Insufficient documentation

## 2012-12-20 DIAGNOSIS — E119 Type 2 diabetes mellitus without complications: Secondary | ICD-10-CM | POA: Insufficient documentation

## 2012-12-20 DIAGNOSIS — W010XXA Fall on same level from slipping, tripping and stumbling without subsequent striking against object, initial encounter: Secondary | ICD-10-CM | POA: Insufficient documentation

## 2012-12-20 DIAGNOSIS — S93402A Sprain of unspecified ligament of left ankle, initial encounter: Secondary | ICD-10-CM

## 2012-12-20 DIAGNOSIS — S93409A Sprain of unspecified ligament of unspecified ankle, initial encounter: Secondary | ICD-10-CM | POA: Insufficient documentation

## 2012-12-20 DIAGNOSIS — E785 Hyperlipidemia, unspecified: Secondary | ICD-10-CM | POA: Insufficient documentation

## 2012-12-20 DIAGNOSIS — Y92009 Unspecified place in unspecified non-institutional (private) residence as the place of occurrence of the external cause: Secondary | ICD-10-CM | POA: Insufficient documentation

## 2012-12-20 DIAGNOSIS — H539 Unspecified visual disturbance: Secondary | ICD-10-CM | POA: Insufficient documentation

## 2012-12-20 MED ORDER — ACETAMINOPHEN 325 MG PO TABS
650.0000 mg | ORAL_TABLET | Freq: Once | ORAL | Status: AC
  Administered 2012-12-21: 650 mg via ORAL
  Filled 2012-12-20: qty 2

## 2012-12-20 NOTE — ED Notes (Signed)
Pt reports pain in left ankle after falling when being tripped by a tree branch.

## 2012-12-20 NOTE — ED Provider Notes (Signed)
History     CSN: [id number]  Arrival date & time 12/20/12  2115   First MD Initiated Contact with Patient 12/20/12 2157      Chief Complaint  Patient presents with  . Ankle Pain    (Consider location/radiation/quality/duration/timing/severity/associated sxs/prior treatment) HPI Comments: Taylor Mitchell is a 72 y.o. Female presenting with left foot and ankle pain after tripping over a tree branch while working in her yard.  She denies other injury including no head, neck or back in injury.  Her pain is constant,  Mild unless she attempts to weight bear.  She has used a cane to help minimize weight bearing.  She has taken no medicines prior to arrival,  But did use ice which did not relieve her pain.     The history is provided by the patient.    Past Medical History  Diagnosis Date  . Vitamin D deficiency   . Dyspepsia   . Dyspepsia and other specified disorders of function of stomach   . Fatigue   . Chest pain   . Other B-complex deficiencies   . Unspecified hereditary and idiopathic peripheral neuropathy   . Peripheral neuropathy   . Skin rash   . Pain in joint, shoulder region   . Anxiety states   . Allergic rhinitis, cause unspecified   . Back pain     been seeing chiropractor for almost 50 years, fell off a tractor at age 82  . Hearing loss   . Diabetes mellitus, type 2     prediabetic, diet controlled  . Hyperlipidemia     intolerant of all medication    Past Surgical History  Procedure Laterality Date  . Nasal septum surgery  1967  . Hernia repair  2004 approx    right inguinal  . Tonsillectomy      Family History  Problem Relation Age of Onset  . Cancer Mother     ovarian    History  Substance Use Topics  . Smoking status: Former Games developer  . Smokeless tobacco: Not on file  . Alcohol Use: No    OB History   Grav Para Term Preterm Abortions TAB SAB Ect Mult Living                  Review of Systems  HENT: Negative for neck pain.    Musculoskeletal: Positive for joint swelling and arthralgias.  Skin: Negative for wound.  Neurological: Negative for weakness, numbness and headaches.    Allergies  Metoprolol and Pseudoephedrine  Home Medications   Current Outpatient Rx  Name  Route  Sig  Dispense  Refill  . aspirin (BAYER ASPIRIN EC LOW DOSE) 81 MG EC tablet   Oral   Take 81 mg by mouth daily.           . fish oil-omega-3 fatty acids 1000 MG capsule   Oral   Take 2 g by mouth 2 (two) times daily.           Marland Kitchen loratadine (CLARITIN) 10 MG tablet   Oral   Take 10 mg by mouth daily.         . Misc Natural Products (WHITE WILLOW BARK PO)   Oral   Take 400 mg by mouth daily.          . Multiple Vitamins-Minerals (ONE-A-DAY WEIGHT SMART ADVANCE) TABS   Oral   Take by mouth daily.             BP 101/57  Pulse 61  Temp(Src) 97.8 F (36.6 C) (Oral)  Resp 16  Ht 5\' 7"  (1.702 m)  Wt 182 lb (82.555 kg)  BMI 28.5 kg/m2  SpO2 98%  Physical Exam  Constitutional: She appears well-developed and well-nourished.  HENT:  Head: Atraumatic.  Neck: Normal range of motion.  Cardiovascular:  Pulses equal bilaterally  Musculoskeletal: She exhibits tenderness.       Left ankle: She exhibits swelling and ecchymosis. Tenderness. AITFL tenderness found. No lateral malleolus, no medial malleolus, no head of 5th metatarsal and no proximal fibula tenderness found. Achilles tendon normal.  Neurological: She is alert. She has normal strength. She displays normal reflexes. No sensory deficit.  Equal strength  Skin: Skin is warm and dry.  Psychiatric: She has a normal mood and affect.    ED Course  Procedures (including critical care time)  Labs Reviewed - No data to display Dg Ankle Complete Left  12/20/2012  *RADIOLOGY REPORT*  Clinical Data: Left ankle pain post fall, pain anterior and lateral  LEFT ANKLE COMPLETE - 3+ VIEW  Comparison: None  Findings: Bones appear demineralized. Ankle mortise intact. Soft  tissue swelling diffusely. Plantar and Achilles insertion calcaneal spurs. Question small nonfused ossicle at tip of lateral malleolus, appears rounded and likely corticated. No definite acute fracture or dislocation identified.  IMPRESSION: No definite acute bony abnormalities. Calcaneal spurring.   Original Report Authenticated By: Ulyses Southward, M.D.    Dg Foot Complete Left  12/20/2012  *RADIOLOGY REPORT*  Clinical Data: Left foot ankle pain, fell doing yard work  LEFT FOOT - COMPLETE 3+ VIEW  Comparison: None  Findings: Mild osseous demineralization. Joint spaces preserved. Cyst identified within the middle cuneiform. No acute fracture, dislocation or bone destruction. Dorsal soft tissue swelling overlying the tarsal navicular. Calcaneal spurring.  IMPRESSION: No acute abnormalities. Probable degenerative cyst within middle cuneiform.   Original Report Authenticated By: Ulyses Southward, M.D.      1. Foot contusion, left, initial encounter   2. Ankle sprain, left, initial encounter       MDM  Patients labs and/or radiological studies were viewed and considered during the medical decision making and disposition process.  Pt was placed in aso,  She has a cane,  Also has access to a walker.  Encouraged RICE,  Recheck by pcp if not improving over the next week.  Tylenol or motrin for pain relief prn.        Burgess Amor, PA-C 12/20/12 2340

## 2012-12-20 NOTE — ED Notes (Signed)
Pt back from xray, assessed left foot, appears moderately swollen on the top of the foot and lower ankle area, pt states she tripped over some bushes while gardening earlier today, informed pt and family that results should take around 20-30 minutes.

## 2012-12-21 NOTE — ED Provider Notes (Signed)
Medical screening examination/treatment/procedure(s) were performed by non-physician practitioner and as supervising physician I was immediately available for consultation/collaboration.   Dione Booze, MD 12/21/12 [phone]

## 2013-01-08 LAB — CBC WITH DIFFERENTIAL/PLATELET
Basophils Absolute: 0.1 10*3/uL (ref 0.0–0.1)
Eosinophils Absolute: 0.2 10*3/uL (ref 0.0–0.7)
Eosinophils Relative: 3 % (ref 0–5)
Lymphs Abs: 2.4 10*3/uL (ref 0.7–4.0)
MCH: 30.6 pg (ref 26.0–34.0)
MCV: 91.2 fL (ref 78.0–100.0)
Platelets: 248 10*3/uL (ref 150–400)
RDW: 13.3 % (ref 11.5–15.5)

## 2013-01-12 ENCOUNTER — Encounter: Payer: Self-pay | Admitting: Family Medicine

## 2013-01-12 ENCOUNTER — Ambulatory Visit (INDEPENDENT_AMBULATORY_CARE_PROVIDER_SITE_OTHER): Payer: Medicare Other | Admitting: Family Medicine

## 2013-01-12 VITALS — BP 104/64 | HR 67 | Resp 16 | Ht 67.0 in | Wt 179.0 lb

## 2013-01-12 DIAGNOSIS — F411 Generalized anxiety disorder: Secondary | ICD-10-CM

## 2013-01-12 DIAGNOSIS — M25579 Pain in unspecified ankle and joints of unspecified foot: Secondary | ICD-10-CM

## 2013-01-12 DIAGNOSIS — E785 Hyperlipidemia, unspecified: Secondary | ICD-10-CM

## 2013-01-12 DIAGNOSIS — M25572 Pain in left ankle and joints of left foot: Secondary | ICD-10-CM | POA: Insufficient documentation

## 2013-01-12 DIAGNOSIS — R7309 Other abnormal glucose: Secondary | ICD-10-CM

## 2013-01-12 DIAGNOSIS — J309 Allergic rhinitis, unspecified: Secondary | ICD-10-CM

## 2013-01-12 DIAGNOSIS — R7301 Impaired fasting glucose: Secondary | ICD-10-CM

## 2013-01-12 DIAGNOSIS — R7303 Prediabetes: Secondary | ICD-10-CM

## 2013-01-12 MED ORDER — PREDNISONE 5 MG PO TABS: 5.0000 mg | ORAL_TABLET | Freq: Two times a day (BID) | ORAL | Status: AC

## 2013-01-12 NOTE — Assessment & Plan Note (Signed)
3 week h/o sprain with ongoing pain and reduced mobility, refer to ortho for further eval and management

## 2013-01-12 NOTE — Assessment & Plan Note (Signed)
Marked improvement, takes no medication for this

## 2013-01-12 NOTE — Progress Notes (Signed)
  Subjective:    Patient ID: Taylor Mitchell, female    DOB: 1941/01/28, 72 y.o.   MRN: [id number]  HPI  The PT is here for follow up and re-evaluation of chronic medical conditions, medication management and review of any available recent lab and radiology data.  Preventive health is updated, specifically  Cancer screening and Immunization. Need to f/u on last colonoscopy path or recommendation, pt believes she has a 5 year f/u  Questions or concerns regarding consultations or procedures which the PT has had in the interim are  Addressed.Was in the ED early May for left ankle pain and swelling, twisted ankle.Both have improved, but she still has point tenderness and limitation in ankle movement. The PT denies any adverse reactions to current medications since the last visit.  C/o head feeling full with excessive congestion in chest,no fever or chills    Review of Systems See HPI  Denies  ear pain or sore throat. Denies productive cough or wheezing. Denies chest pains, palpitations and leg swelling Denies abdominal pain, nausea, vomiting,diarrhea or constipation.   Denies dysuria, frequency, hesitancy or incontinence.  Denies headaches, seizures, numbness, or tingling. Denies depression, anxiety or insomnia. Denies skin break down or rash.        Objective:   Physical Exam  Patient alert and oriented and in no cardiopulmonary distress.  HEENT: No facial asymmetry, EOMI, no sinus tenderness,  oropharynx pink and moist.  Neck supple no adenopathy.Nasal mucosa erythematous and edematous  Chest: Adequate air entry, few scattered crackles, no wheezes CVS: S1, S2 no murmurs, no S3.  ABD: Soft non tender. Bowel sounds normal.  Ext: No edema  MS: Adequate ROM spine, shoulders, hips and knees.Decreased ROM left ankle with point tenderness at lateral malleolus  Skin: Intact, no ulcerations or rash noted.  Psych: Good eye contact, normal affect. Memory intact not anxious or  depressed appearing.  CNS: CN 2-12 intact, power, tone and sensation normal throughout.       Assessment & Plan:

## 2013-01-12 NOTE — Assessment & Plan Note (Signed)
Dietary management only Hyperlipidemia:Low fat diet discussed and encouraged.

## 2013-01-12 NOTE — Patient Instructions (Addendum)
F/u  early November.please call if you need me before  Blood sugar has improved, congrats, keep it up  Continue to work on low fat diet.  You are not anemic, you do not need iron supplement   Fasting lipid and HBA1C early November before visit  Please use the nasal spray and claritin every day together for uncontrolled allergies and chest congestion  Prednisone is sent in for 5 days total , this will also help  We will try to get the info from [facility] as to when your next colonoscopy is due, and let you know, since you believe it is this year  You are referred to Dr Hilda Lias re left ankle

## 2013-01-12 NOTE — Assessment & Plan Note (Signed)
Uncontrolled, head feels full and chest congestion, pt to use steroid nasal spray daily along with claritin, and short course of prednisone also prescribed

## 2013-01-12 NOTE — Assessment & Plan Note (Signed)
Marked improvement in HBA1C with dietary change, pt applauded on this, unable to execise in past month due to ankle pain

## 2013-01-13 ENCOUNTER — Other Ambulatory Visit: Payer: Self-pay | Admitting: Family Medicine

## 2013-01-13 ENCOUNTER — Ambulatory Visit: Payer: Medicare Other | Admitting: Family Medicine

## 2013-01-13 DIAGNOSIS — Z86010 Personal history of colonic polyps: Secondary | ICD-10-CM

## 2013-01-16 ENCOUNTER — Telehealth: Payer: Self-pay | Admitting: Family Medicine

## 2013-01-16 NOTE — Telephone Encounter (Signed)
noted 

## 2013-01-20 ENCOUNTER — Telehealth: Payer: Self-pay | Admitting: Family Medicine

## 2013-01-20 NOTE — Telephone Encounter (Signed)
noted 

## 2013-03-02 ENCOUNTER — Telehealth: Payer: Self-pay | Admitting: Family Medicine

## 2013-03-02 ENCOUNTER — Other Ambulatory Visit: Payer: Self-pay | Admitting: Family Medicine

## 2013-03-02 NOTE — Telephone Encounter (Signed)
rx sent as requested.

## 2013-06-03 ENCOUNTER — Ambulatory Visit: Payer: Self-pay | Admitting: Family Medicine

## 2013-06-15 ENCOUNTER — Encounter: Payer: Self-pay | Admitting: Family Medicine

## 2013-06-15 ENCOUNTER — Encounter (INDEPENDENT_AMBULATORY_CARE_PROVIDER_SITE_OTHER): Payer: Self-pay

## 2013-06-15 ENCOUNTER — Ambulatory Visit (INDEPENDENT_AMBULATORY_CARE_PROVIDER_SITE_OTHER): Payer: Medicare Other | Admitting: Family Medicine

## 2013-06-15 VITALS — BP 110/74 | HR 84 | Resp 16 | Ht 67.0 in | Wt 183.8 lb

## 2013-06-15 DIAGNOSIS — F411 Generalized anxiety disorder: Secondary | ICD-10-CM

## 2013-06-15 DIAGNOSIS — J309 Allergic rhinitis, unspecified: Secondary | ICD-10-CM

## 2013-06-15 DIAGNOSIS — E785 Hyperlipidemia, unspecified: Secondary | ICD-10-CM

## 2013-06-15 DIAGNOSIS — R7309 Other abnormal glucose: Secondary | ICD-10-CM

## 2013-06-15 DIAGNOSIS — R7303 Prediabetes: Secondary | ICD-10-CM

## 2013-06-15 DIAGNOSIS — N907 Vulvar cyst: Secondary | ICD-10-CM

## 2013-06-15 DIAGNOSIS — N9489 Other specified conditions associated with female genital organs and menstrual cycle: Secondary | ICD-10-CM

## 2013-06-15 NOTE — Patient Instructions (Addendum)
F/u in 4 month, with rectal call if you need me before  Fasting lipid, HBA1C and chem 7  2nd week in January Bone density test is recommended , please reconsider  The area of concern is a cyst,,no need to remove it unless it starts itching, bleeding or increases in size rapidly

## 2013-06-15 NOTE — Progress Notes (Signed)
  Subjective:    Patient ID: Taylor Mitchell, female    DOB: [birth year], 72 y.o.   MRN: [id number]  HPI The PT is here for follow up and re-evaluation of chronic medical conditions, medication management and review of any available recent lab and radiology data.  Preventive health is updated, specifically  Cancer screening and Immunization.   Questions or concerns regarding consultations or procedures which the PT has had in the interim are  Addressed.Has produced for permanent record, copies of her colonoscopy and recent mammogram, both within normal and are up C/o crusty lesion on mons ,n o bleeding or itching, present "for some time" unable to state if size is increasing Continues to remain active and be cautious with sugar, carbohydrate and fatty food intake     Review of Systems See HPI Denies recent fever or chills. Denies sinus pressure, nasal congestion, ear pain or sore throat. Denies chest congestion, productive cough or wheezing. Denies chest pains, palpitations and leg swelling Denies abdominal pain, nausea, vomiting,diarrhea or constipation.   Denies dysuria, frequency, hesitancy or incontinence. Denies joint pain, swelling and limitation in mobility. Denies headaches, seizures, numbness, or tingling. Denies depression,  or insomnia.Staes that se "only gets irritated with her husband , who she states , complains every day       Objective:   Physical Exam Patient alert and oriented and in no cardiopulmonary distress.  HEENT: No facial asymmetry, EOMI, no sinus tenderness,  oropharynx pink and moist.  Neck supple no adenopathy.  Chest: Clear to auscultation bilaterally.  CVS: S1, S2 no murmurs, no S3.  ABD: Soft non tender. Bowel sounds normal. Genital: crusty lesion over cyst on mons pubis, no erythema, drainage or tenderness Ext: No edema  MS: Adequate ROM spine, shoulders, hips and knees.  Skin: Intact, no ulcerations or rash noted.  Psych: Good eye contact,  normal affect. Memory intact not anxious or depressed appearing.  CNS: CN 2-12 intact, power, tone and sensation normal throughout.        Assessment & Plan:

## 2013-06-16 ENCOUNTER — Telehealth: Payer: Self-pay | Admitting: Family Medicine

## 2013-06-16 DIAGNOSIS — N907 Vulvar cyst: Secondary | ICD-10-CM | POA: Insufficient documentation

## 2013-06-16 NOTE — Assessment & Plan Note (Signed)
Controlled, no change in medication  

## 2013-06-16 NOTE — Assessment & Plan Note (Signed)
Patient educated about the importance of limiting  Carbohydrate intake , the need to commit to daily physical activity for a minimum of 30 minutes , and to commit weight loss. The fact that changes in all these areas will reduce or eliminate all together the development of diabetes is stressed.    

## 2013-06-16 NOTE — Assessment & Plan Note (Signed)
Pt examined in area of concern, has crusted cyst , hyperpigmented on labia majora just above symphisis pubis. She is advised to watch and if changes occur will be referred for removal

## 2013-06-16 NOTE — Assessment & Plan Note (Signed)
improved

## 2013-06-16 NOTE — Assessment & Plan Note (Signed)
Hyperlipidemia:Low fat diet discussed and encouraged.  Intolerant of all meds

## 2013-06-17 NOTE — Telephone Encounter (Signed)
Patient is asking for a referral for husband to [facility].

## 2013-06-17 NOTE — Telephone Encounter (Signed)
pls explain that husband will need to sign a record release at the [facility] card office brefore an appt will be made, I will enter the refwerral let her know, thanks

## 2013-06-18 NOTE — Telephone Encounter (Signed)
Called and spoke with Fayrene Fearing [place] and he states that has appointment on the 18th at [facility].

## 2013-06-25 ENCOUNTER — Encounter: Payer: Self-pay | Admitting: Family Medicine

## 2013-08-13 DIAGNOSIS — I639 Cerebral infarction, unspecified: Secondary | ICD-10-CM

## 2013-08-13 HISTORY — DX: Cerebral infarction, unspecified: I63.9

## 2013-10-12 ENCOUNTER — Ambulatory Visit: Payer: Medicare Other | Admitting: Family Medicine

## 2013-10-28 LAB — LIPID PANEL
Cholesterol: 319 mg/dL — ABNORMAL HIGH (ref 0–200)
HDL: 48 mg/dL (ref >39)
LDL Cholesterol: 241 mg/dL — ABNORMAL HIGH (ref 0–99)
Total CHOL/HDL Ratio: 6.6 Ratio
Triglycerides: 148 mg/dL (ref <150)
VLDL: 30 mg/dL (ref 0–40)

## 2013-10-28 LAB — BASIC METABOLIC PANEL
BUN: 14 mg/dL (ref 6–23)
CO2: 26 mEq/L (ref 19–32)
Calcium: 9.4 mg/dL (ref 8.4–10.5)
Chloride: 104 mEq/L (ref 96–112)
Creat: 0.65 mg/dL (ref 0.50–1.10)
Glucose, Bld: 130 mg/dL — ABNORMAL HIGH (ref 70–99)
Potassium: 4.2 mEq/L (ref 3.5–5.3)
Sodium: 140 mEq/L (ref 135–145)

## 2013-10-28 LAB — HEMOGLOBIN A1C
Hgb A1c MFr Bld: 6.2 % — ABNORMAL HIGH (ref <5.7)
Mean Plasma Glucose: 131 mg/dL — ABNORMAL HIGH (ref <117)

## 2013-11-02 ENCOUNTER — Ambulatory Visit (INDEPENDENT_AMBULATORY_CARE_PROVIDER_SITE_OTHER): Payer: Medicare Other | Admitting: Family Medicine

## 2013-11-02 ENCOUNTER — Encounter (INDEPENDENT_AMBULATORY_CARE_PROVIDER_SITE_OTHER): Payer: Self-pay

## 2013-11-02 ENCOUNTER — Encounter: Payer: Self-pay | Admitting: Family Medicine

## 2013-11-02 VITALS — BP 120/80 | HR 63 | Resp 16 | Ht 67.0 in | Wt 186.0 lb

## 2013-11-02 DIAGNOSIS — Z1211 Encounter for screening for malignant neoplasm of colon: Secondary | ICD-10-CM

## 2013-11-02 DIAGNOSIS — F411 Generalized anxiety disorder: Secondary | ICD-10-CM

## 2013-11-02 DIAGNOSIS — J309 Allergic rhinitis, unspecified: Secondary | ICD-10-CM

## 2013-11-02 DIAGNOSIS — R7303 Prediabetes: Secondary | ICD-10-CM

## 2013-11-02 DIAGNOSIS — R7309 Other abnormal glucose: Secondary | ICD-10-CM

## 2013-11-02 DIAGNOSIS — E8881 Metabolic syndrome: Secondary | ICD-10-CM

## 2013-11-02 DIAGNOSIS — E785 Hyperlipidemia, unspecified: Secondary | ICD-10-CM

## 2013-11-02 DIAGNOSIS — E039 Hypothyroidism, unspecified: Secondary | ICD-10-CM

## 2013-11-02 DIAGNOSIS — E663 Overweight: Secondary | ICD-10-CM

## 2013-11-02 LAB — POC HEMOCCULT BLD/STL (OFFICE/1-CARD/DIAGNOSTIC): FECAL OCCULT BLD: NEGATIVE

## 2013-11-02 MED ORDER — FLUTICASONE PROPIONATE 50 MCG/ACT NA SUSP: 1.0000 | Freq: Every day | NASAL | Status: DC

## 2013-11-02 NOTE — Patient Instructions (Addendum)
F/u in 4 month, call if you need me before  Your cholesterol and blood sugar and weight have all increased, increasing your risk of heart disease, you need to intentionally eat mainly vegetable and fruit , fresh/frozen, high fiber grains , and white meat baked/grilled   Rectal exam today is normal  Please use  your allergy medication every day and use face mask outside to control your symptoms  Fasting lipid, hBA1C and TSH in 4 months, before visit please  Reconsider the bone density test, highly recommended for bone health and reduction in fracture risk  Fall Prevention and Home Safety Falls cause injuries and can affect all age groups. It is possible to prevent falls.  HOW TO PREVENT FALLS  Wear shoes with rubber soles that do not have an opening for your toes.  Keep the inside and outside of your house well lit.  Use night lights throughout your home.  Remove clutter from floors.  Clean up floor spills.  Remove throw rugs or fasten them to the floor with carpet tape.  Do not place electrical cords across pathways.  Put grab bars by your tub, shower, and toilet. Do not use towel bars as grab bars.  Put handrails on both sides of the stairway. Fix loose handrails.  Do not climb on stools or stepladders, if possible.  Do not wax your floors.  Repair uneven or unsafe sidewalks, walkways, or stairs.  Keep items you use a lot within reach.  Be aware of pets.  Keep emergency numbers next to the telephone.  Put smoke detectors in your home and near bedrooms. Ask your doctor what other things you can do to prevent falls. Document Released: 05/26/2009 Document Revised: 01/29/2012 Document Reviewed: 10/30/2011 [facility] Patient Information 2014 [place], [place].

## 2013-11-02 NOTE — Progress Notes (Signed)
Subjective:    Patient ID: Tanekia Ryans Bower, female    DOB: September 14, 1940, 73 y.o.   MRN: [id number]  HPI The PT is here for follow up and re-evaluation of chronic medical conditions, medication management and review of any available recent lab and radiology data.  Preventive health is updated, specifically  Cancer screening and Immunization.    The PT denies any adverse reactions to current medications since the last visit.  There are no new concerns. She does state that she has not committed to the lifestyle change which she knows that she needs to improve her cholesterol and plans to do this There are no specific complaints       Review of Systems See HPI Denies recent fever or chills. Denies sinus pressure, nasal congestion, ear pain or sore throat. Denies chest congestion, productive cough or wheezing. Denies chest pains, palpitations and leg swelling Denies abdominal pain, nausea, vomiting,diarrhea or constipation.   Denies dysuria, frequency, hesitancy or incontinence. Denies joint pain, swelling and limitation in mobility. Denies headaches, seizures, numbness, or tingling. Denies depression, uncontrolled anxiety or insomnia. Denies skin break down or rash.        Objective:   Physical Exam BP 120/80  Pulse 63  Resp 16  Ht 5\' 7"  (1.702 m)  Wt 186 lb (84.369 kg)  BMI 29.12 kg/m2  SpO2 98% Patient alert and oriented and in no cardiopulmonary distress.  HEENT: No facial asymmetry, EOMI,   oropharynx pink and moist.  Neck supple no JVD, no mass.  Chest: Clear to auscultation bilaterally.  CVS: S1, S2 no murmurs, no S3.  ABD: Soft non tender.  Rectal: good sphincter tone, no masses or hemorhoids palpated, heme negative stool Ext: No edema  MS: Adequate ROM spine, shoulders, hips and knees.  Skin: Intact, no ulcerations or rash noted.  Psych: Good eye contact, normal affect. Memory intact not anxious or depressed appearing.  CNS: CN 2-12 intact, power,   normal throughout.no focal deficits noted.        Assessment & Plan:  HYPERLIPIDEMIA Unchanged in the past 12 month Hyperlipidemia:Low fat diet discussed and encouraged.  Pt intolerant of all medication previously attempted, states she will work with diet and will see how she does in next 3 to 4  Months, no interest in med at this time    Prediabetes Deteriorated Patient educated about the importance of limiting  Carbohydrate intake , the need to commit to daily physical activity for a minimum of 30 minutes , and to commit weight loss. The fact that changes in all these areas will reduce or eliminate all together the development of diabetes is stressed.     Metabolic syndrome X The increased risk of cardiovascular disease associated with this diagnosis, and the need to consistently work on lifestyle to change this is discussed. Following  a  heart healthy diet ,commitment to 30 minutes of exercise at least 5 days per week, as well as control of blood sugar and cholesterol , and achieving a healthy weight are all the areas to be addressed .   Overweight (BMI 25.0-29.9) Deteriorated. Patient re-educated about  the importance of commitment to a  minimum of 150 minutes of exercise per week. The importance of healthy food choices with portion control discussed. Encouraged to start a food diary, count calories and to consider  joining a support group. Sample diet sheets offered. Goals set by the patient for the next several months.     ALLERGIC RHINITIS CAUSE UNSPECIFIED  Uses oTC medication as needed, with adequate symptom control  ANXIETY STATE, UNSPECIFIED Controlled, no change in medication

## 2013-12-16 ENCOUNTER — Telehealth: Payer: Self-pay

## 2013-12-16 ENCOUNTER — Other Ambulatory Visit: Payer: Self-pay

## 2013-12-16 MED ORDER — ALPRAZOLAM 0.25 MG PO TABS: 0.2500 mg | ORAL_TABLET | Freq: Two times a day (BID) | ORAL | Status: DC

## 2013-12-16 NOTE — Telephone Encounter (Signed)
Noted. Patient aware and med sent to pharmacy.

## 2013-12-16 NOTE — Telephone Encounter (Signed)
refillx 1 only, advise will need to come in if anxiety continues for better medication management, of anxiety

## 2014-02-13 DIAGNOSIS — E8881 Metabolic syndrome: Secondary | ICD-10-CM | POA: Insufficient documentation

## 2014-02-13 DIAGNOSIS — E663 Overweight: Secondary | ICD-10-CM | POA: Insufficient documentation

## 2014-02-13 NOTE — Assessment & Plan Note (Addendum)
Unchanged in the past 12 month Hyperlipidemia:Low fat diet discussed and encouraged.  Pt intolerant of all medication previously attempted, states she will work with diet and will see how she does in next 3 to 4  Months, no interest in med at this time

## 2014-02-13 NOTE — Assessment & Plan Note (Signed)
Deteriorated. Patient re-educated about  the importance of commitment to a  minimum of 150 minutes of exercise per week. The importance of healthy food choices with portion control discussed. Encouraged to start a food diary, count calories and to consider  joining a support group. Sample diet sheets offered. Goals set by the patient for the next several months.    

## 2014-02-13 NOTE — Assessment & Plan Note (Signed)
The increased risk of cardiovascular disease associated with this diagnosis, and the need to consistently work on lifestyle to change this is discussed. Following  a  heart healthy diet ,commitment to 30 minutes of exercise at least 5 days per week, as well as control of blood sugar and cholesterol , and achieving a healthy weight are all the areas to be addressed .  

## 2014-02-13 NOTE — Assessment & Plan Note (Signed)
Uses oTC medication as needed, with adequate symptom control

## 2014-02-13 NOTE — Assessment & Plan Note (Signed)
Deteriorated Patient educated about the importance of limiting  Carbohydrate intake , the need to commit to daily physical activity for a minimum of 30 minutes , and to commit weight loss. The fact that changes in all these areas will reduce or eliminate all together the development of diabetes is stressed.    

## 2014-02-13 NOTE — Assessment & Plan Note (Signed)
Controlled, no change in medication  

## 2014-02-26 ENCOUNTER — Other Ambulatory Visit: Payer: Self-pay | Admitting: Family Medicine

## 2014-02-26 LAB — HEMOGLOBIN A1C
HEMOGLOBIN A1C: 6.6 % — AB (ref <5.7)
MEAN PLASMA GLUCOSE: 143 mg/dL — AB (ref <117)

## 2014-02-26 LAB — LIPID PANEL
CHOL/HDL RATIO: 7.1 ratio
Cholesterol: 306 mg/dL — ABNORMAL HIGH (ref 0–200)
HDL: 43 mg/dL (ref >39)
LDL Cholesterol: 203 mg/dL — ABNORMAL HIGH (ref 0–99)
Triglycerides: 299 mg/dL — ABNORMAL HIGH (ref <150)
VLDL: 60 mg/dL — ABNORMAL HIGH (ref 0–40)

## 2014-02-26 LAB — TSH: TSH: 6.858 u[IU]/mL — ABNORMAL HIGH (ref 0.350–4.500)

## 2014-03-01 LAB — T3, FREE: T3, Free: 3 pg/mL (ref 2.3–4.2)

## 2014-03-01 LAB — T4, FREE: Free T4: 1.03 ng/dL (ref 0.80–1.80)

## 2014-03-02 ENCOUNTER — Ambulatory Visit (INDEPENDENT_AMBULATORY_CARE_PROVIDER_SITE_OTHER): Payer: Medicare Other | Admitting: Family Medicine

## 2014-03-02 ENCOUNTER — Other Ambulatory Visit: Payer: Self-pay

## 2014-03-02 ENCOUNTER — Encounter (INDEPENDENT_AMBULATORY_CARE_PROVIDER_SITE_OTHER): Payer: Self-pay

## 2014-03-02 ENCOUNTER — Encounter: Payer: Self-pay | Admitting: Family Medicine

## 2014-03-02 VITALS — BP 112/78 | HR 69 | Resp 16 | Wt 183.4 lb

## 2014-03-02 DIAGNOSIS — E8881 Metabolic syndrome: Secondary | ICD-10-CM

## 2014-03-02 DIAGNOSIS — E785 Hyperlipidemia, unspecified: Secondary | ICD-10-CM

## 2014-03-02 DIAGNOSIS — Z0000 Encounter for general adult medical examination without abnormal findings: Secondary | ICD-10-CM

## 2014-03-02 DIAGNOSIS — R7309 Other abnormal glucose: Secondary | ICD-10-CM

## 2014-03-02 DIAGNOSIS — Z23 Encounter for immunization: Secondary | ICD-10-CM

## 2014-03-02 DIAGNOSIS — R7303 Prediabetes: Secondary | ICD-10-CM

## 2014-03-02 DIAGNOSIS — E039 Hypothyroidism, unspecified: Secondary | ICD-10-CM

## 2014-03-02 MED ORDER — EZETIMIBE 10 MG PO TABS: 10.0000 mg | ORAL_TABLET | Freq: Every day | ORAL | Status: DC

## 2014-03-02 NOTE — Assessment & Plan Note (Signed)
Annual exam as documented. Counseling done  re healthy lifestyle involving commitment to 150 minutes exercise per week, heart healthy diet, and attaining healthy weight.The importance of adequate sleep also discussed. Regular seat belt use  is also discussed. Changes in health habits are decided on by the patient with goals and time frames  set for achieving them. Immunization and cancer screening needs are specifically addressed at this visit.

## 2014-03-02 NOTE — Patient Instructions (Addendum)
F/u in 4 month, call if you need me before  Prevnar today  New for cholesterol is zetia , also you ABSOLUTELY NEED to change how you eat so that your cholesterol improves  I recommend thyroid supplement low dose, also you need a thyroid US , that will be arranged early Nov  Per your request  Fasting lipid, cmp, TSH and HBA1C, CBC in 4 month  Your bLOOD sUGAR has INCREASEd it is the highest it has been , now in diabetic range  You Absolutely NEED to CHANGE how you eat, this WIILL improve your health

## 2014-03-02 NOTE — Assessment & Plan Note (Signed)
Prevnar administered

## 2014-03-02 NOTE — Progress Notes (Signed)
Subjective:    Patient ID: Taylor Mitchell, female    DOB: October 31, 1940, 73 y.o.   MRN: [id number]  HPI  Preventive Screening-Counseling & Management   Patient present here today for a Medicare annual wellness visit. Recent labs are also reviewed which show worsening of her lipids and thyroid function seems to be more underactive, pt is resistant to taking medication for her thyroid, however is willing to try zetia for her lipids.  Current Problems (verified)   Medications Prior to Visit Allergies (verified)   PAST HISTORY  Family History (verified)  Social History (verified)   Risk Factors  Current exercise habits: Discussed daily exercise; walks 3x a week , needs to increase number of days she exercises to at least 5 days  Dietary issues discussed:Low fat diet , marked reduction in fried and fatty foods, reduction in cheese, milk and eggs.      Cardiac risk factors: hyperlipidemia, severe, prediabetes  Depression Screen  (Note: if answer to either of the following is "Yes", a more complete depression screening is indicated)   Over the past two weeks, have you felt down, depressed or hopeless? No  Over the past two weeks, have you felt little interest or pleasure in doing things? No  Have you lost interest or pleasure in daily life? No  Do you often feel hopeless? No  Do you cry easily over simple problems? No   Activities of Daily Living  In your present state of health, do you have any difficulty performing the following activities?  Driving?: No Managing money?: No Feeding yourself?:No Getting from bed to chair?:No Climbing a flight of stairs?:No Preparing food and eating?:No Bathing or showering?:No Getting dressed?:No Getting to the toilet?:No Using the toilet?:No Moving around from place to place?: No  Fall Risk Assessment In the past year have you fallen or had a near fall?: Yes  Are you currently taking any medications that make you  dizzy?:No   Hearing Difficulties: Yes-wears hearing aid Do you often ask people to speak up or repeat themselves?:Yes Do you experience ringing or noises in your ears?:No Do you have difficulty understanding soft or whispered voices?:Yes  Cognitive Testing  Alert? Yes Normal Appearance?Yes  Oriented to person? Yes Place? Yes  Time? Yes  Displays appropriate judgment?Yes  Can read the correct time from a watch face? yes Are you having problems remembering things?No  Advanced Directives have been discussed with the patient?Yes , full code, states depends on how bad she is doing does not want to be indefinitely kept on support   List the Names of Other Physician/Practitioners you currently use: (Updated)    Indicate any recent Medical Services you may have received from other than [facility] providers in the past year (date may be approximate).   Assessment:    Annual Wellness Exam   Plan:         Medicare Attestation  I have personally reviewed:  The patient's medical and social history  Their use of alcohol, tobacco or illicit drugs  Their current medications and supplements  The patient's functional ability including ADLs,fall risks, home safety risks, cognitive, and hearing and visual impairment  Diet and physical activities  Evidence for depression or mood disorders  The patient's weight, height, BMI, and visual acuity have been recorded in the chart. I have made referrals, counseling, and provided education to the patient based on review of the above and I have provided the patient with a written personalized care plan for preventive services.  Review of Systems     Objective:   Physical Exam        Assessment & Plan:  Routine general medical examination at a health care facility Annual exam as documented. Counseling done  re healthy lifestyle involving commitment to 150 minutes exercise per week, heart healthy diet, and attaining healthy weight.The  importance of adequate sleep also discussed. Regular seat belt use  is also discussed. Changes in health habits are decided on by the patient with goals and time frames  set for achieving them. Immunization and cancer screening needs are specifically addressed at this visit.   Need for vaccination for Strep pneumoniae Prevnar administered

## 2014-03-17 ENCOUNTER — Telehealth: Payer: Self-pay

## 2014-03-19 ENCOUNTER — Other Ambulatory Visit: Payer: Self-pay

## 2014-03-19 MED ORDER — GLUCOSE BLOOD VI STRP: ORAL_STRIP | Status: DC

## 2014-03-19 NOTE — Telephone Encounter (Signed)
Strips refilled for once daily testing with Accu Chek Aviva per patient

## 2014-05-10 ENCOUNTER — Telehealth: Payer: Self-pay | Admitting: Family Medicine

## 2014-05-10 DIAGNOSIS — Z1231 Encounter for screening mammogram for malignant neoplasm of breast: Secondary | ICD-10-CM

## 2014-05-10 NOTE — Telephone Encounter (Signed)
Referral entered  

## 2014-05-12 ENCOUNTER — Telehealth: Payer: Self-pay | Admitting: Family Medicine

## 2014-05-12 MED ORDER — ACCU-CHEK MULTICLIX LANCETS MISC: Status: DC

## 2014-05-12 NOTE — Telephone Encounter (Signed)
Lancets sent to pharmacy  °

## 2014-05-13 NOTE — Telephone Encounter (Signed)
Patient is aware 

## 2014-05-14 LAB — LIPID PANEL
Cholesterol: 237 mg/dL — ABNORMAL HIGH (ref 0–200)
HDL: 41 mg/dL (ref >39)
LDL CALC: 175 mg/dL — AB (ref 0–99)
TRIGLYCERIDES: 107 mg/dL (ref <150)
Total CHOL/HDL Ratio: 5.8 Ratio
VLDL: 21 mg/dL (ref 0–40)

## 2014-05-14 LAB — COMPREHENSIVE METABOLIC PANEL
ALK PHOS: 60 U/L (ref 39–117)
ALT: 16 U/L (ref 0–35)
AST: 14 U/L (ref 0–37)
Albumin: 4.2 g/dL (ref 3.5–5.2)
BUN: 14 mg/dL (ref 6–23)
CO2: 26 mEq/L (ref 19–32)
CREATININE: 0.68 mg/dL (ref 0.50–1.10)
Calcium: 9.6 mg/dL (ref 8.4–10.5)
Chloride: 105 mEq/L (ref 96–112)
Glucose, Bld: 109 mg/dL — ABNORMAL HIGH (ref 70–99)
POTASSIUM: 4.2 meq/L (ref 3.5–5.3)
Sodium: 141 mEq/L (ref 135–145)
Total Bilirubin: 0.7 mg/dL (ref 0.2–1.2)
Total Protein: 6.6 g/dL (ref 6.0–8.3)

## 2014-05-14 LAB — HEMOGLOBIN A1C
Hgb A1c MFr Bld: 6.3 % — ABNORMAL HIGH (ref <5.7)
Mean Plasma Glucose: 134 mg/dL — ABNORMAL HIGH (ref <117)

## 2014-05-14 LAB — CBC
HEMATOCRIT: 42.7 % (ref 36.0–46.0)
Hemoglobin: 14.2 g/dL (ref 12.0–15.0)
MCH: 30.7 pg (ref 26.0–34.0)
MCHC: 33.3 g/dL (ref 30.0–36.0)
MCV: 92.2 fL (ref 78.0–100.0)
Platelets: 249 10*3/uL (ref 150–400)
RBC: 4.63 MIL/uL (ref 3.87–5.11)
RDW: 13.2 % (ref 11.5–15.5)
WBC: 5.9 10*3/uL (ref 4.0–10.5)

## 2014-05-14 LAB — TSH: TSH: 4.159 u[IU]/mL (ref 0.350–4.500)

## 2014-05-18 ENCOUNTER — Encounter: Payer: Self-pay | Admitting: Family Medicine

## 2014-05-18 ENCOUNTER — Encounter (INDEPENDENT_AMBULATORY_CARE_PROVIDER_SITE_OTHER): Payer: Self-pay

## 2014-05-18 ENCOUNTER — Ambulatory Visit (INDEPENDENT_AMBULATORY_CARE_PROVIDER_SITE_OTHER): Payer: Medicare Other | Admitting: Family Medicine

## 2014-05-18 VITALS — BP 114/72 | HR 69 | Resp 16 | Ht 67.0 in | Wt 170.0 lb

## 2014-05-18 DIAGNOSIS — R7303 Prediabetes: Secondary | ICD-10-CM

## 2014-05-18 DIAGNOSIS — E663 Overweight: Secondary | ICD-10-CM

## 2014-05-18 DIAGNOSIS — Z23 Encounter for immunization: Secondary | ICD-10-CM | POA: Insufficient documentation

## 2014-05-18 DIAGNOSIS — R7309 Other abnormal glucose: Secondary | ICD-10-CM

## 2014-05-18 DIAGNOSIS — E785 Hyperlipidemia, unspecified: Secondary | ICD-10-CM

## 2014-05-18 NOTE — Assessment & Plan Note (Signed)
Marked improvement with lifestyle change, continue same Updated lab needed at/ before next visit.

## 2014-05-18 NOTE — Assessment & Plan Note (Signed)
Improved. Pt applauded on succesful weight loss through lifestyle change, and encouraged to continue same. Weight loss goal set for the next several months.  

## 2014-05-18 NOTE — Assessment & Plan Note (Signed)
Marked improvement, pt will continue lifestyle change only, has completed 2 months of zetia, but this is extremely expensive Updated lab needed at/ before next visit.

## 2014-05-18 NOTE — Assessment & Plan Note (Signed)
Vaccine administered at visit.  

## 2014-05-18 NOTE — Progress Notes (Signed)
   Subjective:    Patient ID: Taylor Mitchell, female    DOB: 1941-04-24, 73 y.o.   MRN: [id number]  HPI The PT is here for follow up and re-evaluation of chronic medical conditions, medication management and review of any available recent lab and radiology data.  Preventive health is updated, specifically  Cancer screening and Immunization.   Questions or concerns regarding consultations or procedures which the PT has had in the interim are  addressed. The PT denies any adverse reactions to current medications since the last visit. Zetia is very expensive and does not intend to refill, will work on lifestyle only       Review of Systems See HPI Denies recent fever or chills. Denies sinus pressure, nasal congestion, ear pain or sore throat. Denies chest congestion, productive cough or wheezing. Denies chest pains, palpitations and leg swelling Denies abdominal pain, nausea, vomiting,diarrhea or constipation.   Denies dysuria, frequency, hesitancy or incontinence. Denies joint pain, swelling and limitation in mobility. Denies headaches, seizures, numbness, or tingling. Denies depression, anxiety or insomnia. Denies skin break down or rash.        Objective:   Physical Exam BP 114/72  Pulse 69  Resp 16  Ht 5\' 7"  (1.702 m)  Wt 170 lb (77.111 kg)  BMI 26.62 kg/m2  SpO2 97% Patient alert and oriented and in no cardiopulmonary distress.  HEENT: No facial asymmetry, EOMI,   oropharynx pink and moist.  Neck supple no JVD, no mass.  Chest: Clear to auscultation bilaterally.  CVS: S1, S2 no murmurs, no S3.Regular rate.  ABD: Soft non tender.   Ext: No edema  MS: Adequate ROM spine, shoulders, hips and knees.  Skin: Intact, no ulcerations or rash noted.  Psych: Good eye contact, normal affect. Memory intact not anxious or depressed appearing.  CNS: CN 2-12 intact, power,  normal throughout.no focal deficits noted.        Assessment & Plan:  Prediabetes Marked  improvement with lifestyle change, continue same Updated lab needed at/ before next visit.   Overweight (BMI 25.0-29.9) Improved. Pt applauded on succesful weight loss through lifestyle change, and encouraged to continue same. Weight loss goal set for the next several months.   Hyperlipidemia Marked improvement, pt will continue lifestyle change only, has completed 2 months of zetia, but this is extremely expensive Updated lab needed at/ before next visit.   Need for prophylactic vaccination and inoculation against influenza Vaccine administered at visit.

## 2014-05-18 NOTE — Patient Instructions (Signed)
F/u early Feb call if you need me before  CONGRATULATIONS, keep it up!  Fasting lipid, hBa1C in early Feb  Blood pressure is excellent   Flu vaccine today

## 2014-06-15 ENCOUNTER — Ambulatory Visit: Payer: Self-pay | Admitting: Family Medicine

## 2014-07-19 ENCOUNTER — Telehealth: Payer: Self-pay

## 2014-07-20 ENCOUNTER — Telehealth: Payer: Self-pay | Admitting: *Deleted

## 2014-07-20 NOTE — Telephone Encounter (Signed)
See next telephone message. 

## 2014-07-20 NOTE — Telephone Encounter (Signed)
Wants refill of mometasone furoate 1% soln 4 drops in each ear 2x a day for 10 days. Not on medlist

## 2014-07-20 NOTE — Telephone Encounter (Signed)
Pt called requesting mometasone furoate 1% soln 4 drops in each ear 2x a day for 10 days. Please advise pt said she is almost out. [phone].

## 2014-07-21 ENCOUNTER — Other Ambulatory Visit: Payer: Self-pay

## 2014-07-21 MED ORDER — MOMETASONE FUROATE 0.1 % EX SOLN: Freq: Every day | CUTANEOUS | Status: DC

## 2014-07-21 NOTE — Telephone Encounter (Signed)
Script sent pls let her know

## 2014-07-21 NOTE — Telephone Encounter (Signed)
Verbally changed to the drops. Pt aware they were sent

## 2014-07-27 ENCOUNTER — Encounter: Payer: Self-pay | Admitting: Family Medicine

## 2014-08-30 ENCOUNTER — Other Ambulatory Visit: Payer: Self-pay | Admitting: Family Medicine

## 2014-09-20 DIAGNOSIS — E785 Hyperlipidemia, unspecified: Secondary | ICD-10-CM | POA: Diagnosis not present

## 2014-09-20 DIAGNOSIS — R7309 Other abnormal glucose: Secondary | ICD-10-CM | POA: Diagnosis not present

## 2014-09-20 LAB — LIPID PANEL
CHOL/HDL RATIO: 5.3 ratio
CHOLESTEROL: 274 mg/dL — AB (ref 0–200)
HDL: 52 mg/dL (ref >39)
LDL Cholesterol: 198 mg/dL — ABNORMAL HIGH (ref 0–99)
TRIGLYCERIDES: 119 mg/dL (ref <150)
VLDL: 24 mg/dL (ref 0–40)

## 2014-09-20 LAB — HEMOGLOBIN A1C
HEMOGLOBIN A1C: 6.1 % — AB (ref <5.7)
MEAN PLASMA GLUCOSE: 128 mg/dL — AB (ref <117)

## 2014-09-22 ENCOUNTER — Telehealth: Payer: Self-pay | Admitting: Family Medicine

## 2014-09-22 ENCOUNTER — Encounter: Payer: Self-pay | Admitting: Family Medicine

## 2014-09-22 ENCOUNTER — Ambulatory Visit (INDEPENDENT_AMBULATORY_CARE_PROVIDER_SITE_OTHER): Payer: Medicare Other | Admitting: Family Medicine

## 2014-09-22 VITALS — BP 122/78 | HR 71 | Resp 16 | Ht 67.0 in | Wt 172.0 lb

## 2014-09-22 DIAGNOSIS — E785 Hyperlipidemia, unspecified: Secondary | ICD-10-CM | POA: Diagnosis not present

## 2014-09-22 DIAGNOSIS — R7309 Other abnormal glucose: Secondary | ICD-10-CM

## 2014-09-22 DIAGNOSIS — R0789 Other chest pain: Secondary | ICD-10-CM | POA: Diagnosis not present

## 2014-09-22 DIAGNOSIS — R7303 Prediabetes: Secondary | ICD-10-CM

## 2014-09-22 DIAGNOSIS — E039 Hypothyroidism, unspecified: Secondary | ICD-10-CM | POA: Diagnosis not present

## 2014-09-22 DIAGNOSIS — R7989 Other specified abnormal findings of blood chemistry: Secondary | ICD-10-CM

## 2014-09-22 DIAGNOSIS — R9431 Abnormal electrocardiogram [ECG] [EKG]: Secondary | ICD-10-CM

## 2014-09-22 NOTE — Telephone Encounter (Signed)
Pls let pt know that i was able to compare the EKG at today's visit with one from 2010. They look very similar, which sa reassuring i still reco,mmend cardiology evaluationsince it has been 6 ywears since last seen, her cholesterol is very high and she is yunable to tale statins It is best she has updated cardiology evaluation, that is whi I am referring her

## 2014-09-22 NOTE — Assessment & Plan Note (Signed)
Normalized and on no medication Updated lab needed at/ before next visit.

## 2014-09-22 NOTE — Assessment & Plan Note (Signed)
Improved Patient educated about the importance of limiting  Carbohydrate intake , the need to commit to daily physical activity for a minimum of 30 minutes , and to commit weight loss. The fact that changes in all these areas will reduce or eliminate all together the development of diabetes is stressed.   Updated lab needed at/ before next visit.
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
## 2014-09-22 NOTE — Assessment & Plan Note (Signed)
Intermittent chest discomfort, with h/o heaqrt murmur and irregular heart rate Increased cardio metabolic risk, severe dyslipidemia, and IGT and truncal obesity EKG in office today essentially unchanged since 2010, however based on multiple cv risk factors re eval by cardiology warranted. Importance of dietary change and regular physical activity to  Reduce CV risk is stressed

## 2014-09-22 NOTE — Assessment & Plan Note (Signed)
Deteriorated. Statin again offered, refuse Hyperlipidemia:Low fat diet discussed and encouraged.  Updated lab needed at/ before next visit.

## 2014-09-22 NOTE — Patient Instructions (Addendum)
F/u in 3 month, call if you need me before  If your EKG is abnormal, you will be referred to a cardiologist  Work on lowering fried and fatty food intake , your cholesterol is TOO high,I suggest also that you reconsider pravachol 10 mg   Fasting lipid, chem 7 , hBa1C and TSH in 3 month  Good that blood sugar is better

## 2014-09-24 NOTE — Telephone Encounter (Signed)
Patient aware.

## 2014-09-24 NOTE — Telephone Encounter (Signed)
Called and left message for patient to call office for update

## 2014-09-28 ENCOUNTER — Ambulatory Visit: Payer: Medicare Other | Admitting: Cardiovascular Disease

## 2014-10-12 ENCOUNTER — Encounter: Payer: Self-pay | Admitting: *Deleted

## 2014-10-12 ENCOUNTER — Ambulatory Visit (INDEPENDENT_AMBULATORY_CARE_PROVIDER_SITE_OTHER): Payer: Medicare Other | Admitting: Cardiology

## 2014-10-12 ENCOUNTER — Encounter: Payer: Self-pay | Admitting: Cardiology

## 2014-10-12 VITALS — BP 126/84 | HR 64 | Ht 67.0 in | Wt 173.6 lb

## 2014-10-12 DIAGNOSIS — R9431 Abnormal electrocardiogram [ECG] [EKG]: Secondary | ICD-10-CM

## 2014-10-12 DIAGNOSIS — R7303 Prediabetes: Secondary | ICD-10-CM

## 2014-10-12 DIAGNOSIS — R072 Precordial pain: Secondary | ICD-10-CM

## 2014-10-12 DIAGNOSIS — R7309 Other abnormal glucose: Secondary | ICD-10-CM | POA: Diagnosis not present

## 2014-10-12 DIAGNOSIS — E782 Mixed hyperlipidemia: Secondary | ICD-10-CM | POA: Diagnosis not present

## 2014-10-12 NOTE — Patient Instructions (Signed)
Your physician recommends that you schedule a follow-up appointment after Lexi scan.  Your physician recommends that you continue on your current medications as directed. Please refer to the Current Medication list given to you today.  Your physician has requested that you have a lexiscan myoview. For further information please visit [email or web address]. Please follow instruction sheet, as given.  Thank you for choosing [facility]!

## 2014-10-12 NOTE — Progress Notes (Signed)
Cardiology Office Note  Date: 10/12/2014   ID: Briani, Maul 1940-10-15, MRN [id number]  PCP: Tula Nakayama, MD  Primary Cardiologist: Rozann Lesches, MD   Chief Complaint  Patient presents with  . Chest Pain    History of Present Illness: Taylor Mitchell is a 74 y.o. female referred for cardiology consultation by Dr. Moshe Cipro. She presents with intermittent tightness in her chest, seems to be most noticeable when she is emotionally upset, sometimes when she is walking. She has been under a lot of stress due to illness in her husband. Interestingly, she states that she has used her as needed Xanax more regularly of late, and the symptoms have improved.  Record review finds previous evaluation by Dr. Verl Blalock in 2010 for abnormal ECG and palpitations. She has had previous cardiac catheterization in 2005 that demonstrated minimal atherosclerosis. ECG has been chronically abnormal with T-wave inversions noted across the precordium, most prominent in leads V1 through V3. She has not had any repeat ischemic testing since 2005.  Recent lab work reviewed below, LDL was elevated at 198. She has a history of medication intolerances including Zetia, Zocor, Crestor, Lipitor, and Pravachol. We discussed referral to the lipid clinic, and she declined today. Additional cardiac risk factor includes prediabetes.  Past Medical History  Diagnosis Date  . Vitamin D deficiency   . Dyspepsia   . Peripheral neuropathy   . Anxiety   . Allergic rhinitis   . Chronic back pain     Seeing chiropractor for almost 75 years, fell off a tractor at age 37  . Hearing loss   . Diabetes mellitus, type 2     Prediabetic, diet controlled  . Hyperlipidemia     Intolerant of all medication    Past Surgical History  Procedure Laterality Date  . Nasal septum surgery  1967  . Inguinal hernia repair Right 2004 approx  . Tonsillectomy    . Partial hysterectomy      Current Outpatient Prescriptions    Medication Sig Dispense Refill  . ACCU-CHEK AVIVA PLUS test strip TEST DAILY 50 each 2  . ALPRAZolam (XANAX) 0.25 MG tablet Take 1 tablet (0.25 mg total) by mouth 2 (two) times daily. 30 tablet 0  . aspirin (BAYER ASPIRIN EC LOW DOSE) 81 MG EC tablet Take 81 mg by mouth daily.      . fish oil-omega-3 fatty acids 1000 MG capsule Take 2 g by mouth 2 (two) times daily.      . fluticasone (FLONASE) 50 MCG/ACT nasal spray Place 1 spray into both nostrils daily. 16 g 4  . Lancets (ACCU-CHEK MULTICLIX) lancets Use as instructed once daily testing dx 250.00 (For accuchek aviva meter) 50 each 11  . loratadine (CLARITIN) 10 MG tablet Take 10 mg by mouth daily.    . meclizine (ANTIVERT) 25 MG tablet Take 25 mg by mouth as needed.    . Misc Natural Products (WHITE WILLOW BARK PO) Take 400 mg by mouth daily.     . mometasone (ELOCON) 0.1 % lotion Apply topically daily. Four drops in each ear twice daily for  1 week, then as needed 60 mL 0  . Multiple Vitamins-Minerals (ONE-A-DAY WEIGHT SMART ADVANCE) TABS Take by mouth daily.       No current facility-administered medications for this visit.    Allergies:  Metoprolol and Pseudoephedrine   Social History: The patient  reports that she quit smoking about 31 years ago. Her smoking use included Cigarettes. She started  smoking about 59 years ago. She does not have any smokeless tobacco history on file. She reports that she does not drink alcohol or use illicit drugs.   Family History: The patient's family history includes Kidney disease in her brother; Ovarian cancer in her mother.   ROS:  Please see the history of present illness. Otherwise, complete review of systems is positive for anxiety.  All other systems are reviewed and negative.    Physical Exam: VS:  BP 126/84 mmHg  Pulse 64  Ht 5\' 7"  (1.702 m)  Wt 173 lb 9.6 oz (78.744 kg)  BMI 27.18 kg/m2  SpO2 98%, BMI Body mass index is 27.18 kg/(m^2).  Wt Readings from Last 3 Encounters:  10/12/14  173 lb 9.6 oz (78.744 kg)  09/22/14 172 lb (78.019 kg)  05/18/14 170 lb (77.111 kg)     General: Patient appears comfortable at rest. HEENT: Conjunctiva and lids normal, oropharynx clear with moist mucosa. Neck: Supple, no elevated JVP or carotid bruits, no thyromegaly. Lungs: Clear to auscultation, nonlabored breathing at rest. Cardiac: Regular rate and rhythm, no S3 or significant systolic murmur, no pericardial rub. Abdomen: Soft, nontender, bowel sounds present, no guarding or rebound. Extremities: No pitting edema, distal pulses 2+. Skin: Warm and dry. Musculoskeletal: No kyphosis. Neuropsychiatric: Alert and oriented x3, affect grossly appropriate.   ECG: Recent tracing from 09/22/2014 reviewed finding sinus rhythm with mildly prolonged PR interval, anterolateral T wave inversions which are fairly prominent lead V1 through V3 (old compared to prior tracings), left axis deviation.  Recent Labwork: 05/14/2014: ALT 16; AST 14; BUN 14; Creatinine 0.68; Hemoglobin 14.2; Platelets 249; Potassium 4.2; Sodium 141; TSH 4.159     Component Value Date/Time   CHOL 274* 09/20/2014 0749   TRIG 119 09/20/2014 0749   HDL 52 09/20/2014 0749   CHOLHDL 5.3 09/20/2014 0749   VLDL 24 09/20/2014 0749   LDLCALC 198* 09/20/2014 0749   Hemoglobin A1c 6.1 on 09/20/2014   ASSESSMENT AND PLAN:  1. Precordial pain, typical and atypical features, seems to be some association with emotional stress however. Her ECG is significantly abnormal as detailed above, but this looks to be a chronic finding. Cardiac risk factors include prediabetes and hyperlipidemia that has been untreated due to medication intolerances. Recent LDL was 198. Plan is to follow-up with a Lexiscan Cardiolite to assess for progressive obstructive CAD. If low risk, would keep follow-up with Dr. Moshe Cipro and work on stress reduction. Otherwise continue risk factor modification.  2. Hyperlipidemia, recent LDL 198, medication intolerances as  outlined above. I offered referral to our [facility], however she declined today.  3. Prediabetes, recent hemoglobin A1c 6.1%.  Current medicines are reviewed at length with the patient today.  The patient does not have concerns regarding medicines.  No orders of the defined types were placed in this encounter.    Disposition: Call with test results.   Signed, Satira Sark, MD, [facility] 10/12/2014 3:03 PM    [facility] at [facility] [address], [place], [place] [address] Phone: [phone]; Fax: [phone]

## 2014-10-15 ENCOUNTER — Ambulatory Visit: Payer: Medicare Other | Admitting: Internal Medicine

## 2014-10-18 ENCOUNTER — Encounter (HOSPITAL_COMMUNITY): Payer: Self-pay

## 2014-10-18 ENCOUNTER — Encounter (HOSPITAL_COMMUNITY)
Admission: RE | Admit: 2014-10-18 | Discharge: 2014-10-18 | Disposition: A | Discharge status: Home / Self Care | Payer: Medicare Other | Source: Ambulatory Visit | Attending: Cardiology | Admitting: Cardiology

## 2014-10-18 ENCOUNTER — Ambulatory Visit (HOSPITAL_COMMUNITY)
Admission: RE | Admit: 2014-10-18 | Discharge: 2014-10-18 | Disposition: A | Discharge status: Home / Self Care | Payer: Medicare Other | Source: Ambulatory Visit | Attending: Cardiology | Admitting: Cardiology

## 2014-10-18 DIAGNOSIS — R072 Precordial pain: Secondary | ICD-10-CM | POA: Diagnosis not present

## 2014-10-18 DIAGNOSIS — R079 Chest pain, unspecified: Secondary | ICD-10-CM | POA: Diagnosis not present

## 2014-10-18 DIAGNOSIS — R9431 Abnormal electrocardiogram [ECG] [EKG]: Secondary | ICD-10-CM | POA: Insufficient documentation

## 2014-10-18 MED ORDER — REGADENOSON 0.4 MG/5ML IV SOLN
INTRAVENOUS | Status: AC
  Administered 2014-10-18: 0.4 mg via INTRAVENOUS
  Filled 2014-10-18: qty 5

## 2014-10-18 MED ORDER — SODIUM CHLORIDE 0.9 % IJ SOLN
INTRAMUSCULAR | Status: AC
  Administered 2014-10-18: 10 mL via INTRAVENOUS
  Filled 2014-10-18: qty 3

## 2014-10-18 MED ORDER — REGADENOSON 0.4 MG/5ML IV SOLN
0.4000 mg | Freq: Once | INTRAVENOUS | Status: AC | PRN
  Administered 2014-10-18: 0.4 mg via INTRAVENOUS

## 2014-10-18 MED ORDER — SODIUM CHLORIDE 0.9 % IJ SOLN
10.0000 mL | INTRAMUSCULAR | Status: DC | PRN
  Administered 2014-10-18: 10 mL via INTRAVENOUS
  Filled 2014-10-18: qty 10

## 2014-10-18 MED ORDER — TECHNETIUM TC 99M SESTAMIBI GENERIC - CARDIOLITE
10.0000 | Freq: Once | INTRAVENOUS | Status: AC | PRN
  Administered 2014-10-18: 10 via INTRAVENOUS

## 2014-10-18 MED ORDER — TECHNETIUM TC 99M SESTAMIBI - CARDIOLITE
30.0000 | Freq: Once | INTRAVENOUS | Status: AC | PRN
  Administered 2014-10-18: 10:00:00 30 via INTRAVENOUS

## 2014-10-18 NOTE — Progress Notes (Signed)
Stress Lab Nurses Notes - Taylor Mitchell 10/18/2014 Reason for doing test: Chest Pain Type of test: Wille Glaser Nurse performing test: Gerrit Halls, RN Nuclear Medicine Tech: Dyanne Carrel Echo Tech: Not Applicable MD performing test: S. McDowell/K.Purcell Nails NP Family MD: Moshe Cipro Test explained and consent signed: Yes.   IV started: Saline lock flushed, No redness or edema and Saline lock started in radiology Symptoms: Nausea Treatment/Intervention: None Reason test stopped: protocol completed After recovery IV was: Discontinued via X-ray tech and No redness or edema Patient to return to Nuc. Med at : 11:00 Patient discharged: Home Patient's Condition upon discharge was: stable Comments: During test BP 118/61 & HR 75 .  Recovery BP 113/62 & HR 62.  Symptoms resolved in recovery. [name] T

## 2014-11-09 ENCOUNTER — Telehealth: Payer: Self-pay | Admitting: *Deleted

## 2014-11-09 ENCOUNTER — Other Ambulatory Visit: Payer: Self-pay

## 2014-11-09 MED ORDER — ALPRAZOLAM 0.25 MG PO TABS: 0.2500 mg | ORAL_TABLET | Freq: Two times a day (BID) | ORAL | Status: DC

## 2014-11-09 NOTE — Telephone Encounter (Signed)
Pt called to get a refill on alprazolem Please advise pt only has 3 left

## 2014-11-09 NOTE — Telephone Encounter (Signed)
Noted and med refilled to [facility]

## 2014-12-17 ENCOUNTER — Other Ambulatory Visit: Payer: Self-pay | Admitting: Family Medicine

## 2014-12-17 DIAGNOSIS — E785 Hyperlipidemia, unspecified: Secondary | ICD-10-CM | POA: Diagnosis not present

## 2014-12-17 DIAGNOSIS — R7989 Other specified abnormal findings of blood chemistry: Secondary | ICD-10-CM | POA: Diagnosis not present

## 2014-12-17 DIAGNOSIS — E039 Hypothyroidism, unspecified: Secondary | ICD-10-CM | POA: Diagnosis not present

## 2014-12-17 DIAGNOSIS — R7309 Other abnormal glucose: Secondary | ICD-10-CM | POA: Diagnosis not present

## 2014-12-17 LAB — LIPID PANEL
Cholesterol: 230 mg/dL — ABNORMAL HIGH (ref 0–200)
HDL: 45 mg/dL — AB (ref >=46)
LDL CALC: 154 mg/dL — AB (ref 0–99)
Total CHOL/HDL Ratio: 5.1 Ratio
Triglycerides: 154 mg/dL — ABNORMAL HIGH (ref <150)
VLDL: 31 mg/dL (ref 0–40)

## 2014-12-17 LAB — BASIC METABOLIC PANEL
BUN: 13 mg/dL (ref 6–23)
CO2: 26 mEq/L (ref 19–32)
Calcium: 9.1 mg/dL (ref 8.4–10.5)
Chloride: 105 mEq/L (ref 96–112)
Creat: 0.61 mg/dL (ref 0.50–1.10)
Glucose, Bld: 112 mg/dL — ABNORMAL HIGH (ref 70–99)
POTASSIUM: 3.9 meq/L (ref 3.5–5.3)
SODIUM: 142 meq/L (ref 135–145)

## 2014-12-17 LAB — HEMOGLOBIN A1C
HEMOGLOBIN A1C: 6.2 % — AB (ref <5.7)
Mean Plasma Glucose: 131 mg/dL — ABNORMAL HIGH (ref <117)

## 2014-12-18 LAB — TSH: TSH: 6.021 u[IU]/mL — ABNORMAL HIGH (ref 0.350–4.500)

## 2014-12-21 ENCOUNTER — Encounter: Payer: Self-pay | Admitting: Family Medicine

## 2014-12-21 ENCOUNTER — Ambulatory Visit (INDEPENDENT_AMBULATORY_CARE_PROVIDER_SITE_OTHER): Payer: Medicare Other | Admitting: Family Medicine

## 2014-12-21 VITALS — BP 120/78 | HR 61 | Resp 16 | Ht 67.0 in | Wt 178.0 lb

## 2014-12-21 DIAGNOSIS — L989 Disorder of the skin and subcutaneous tissue, unspecified: Secondary | ICD-10-CM | POA: Diagnosis not present

## 2014-12-21 DIAGNOSIS — E785 Hyperlipidemia, unspecified: Secondary | ICD-10-CM | POA: Diagnosis not present

## 2014-12-21 DIAGNOSIS — R7309 Other abnormal glucose: Secondary | ICD-10-CM

## 2014-12-21 DIAGNOSIS — R0789 Other chest pain: Secondary | ICD-10-CM

## 2014-12-21 DIAGNOSIS — R7303 Prediabetes: Secondary | ICD-10-CM

## 2014-12-21 DIAGNOSIS — E8881 Metabolic syndrome: Secondary | ICD-10-CM

## 2014-12-21 DIAGNOSIS — D539 Nutritional anemia, unspecified: Secondary | ICD-10-CM

## 2014-12-21 DIAGNOSIS — J309 Allergic rhinitis, unspecified: Secondary | ICD-10-CM

## 2014-12-21 DIAGNOSIS — E663 Overweight: Secondary | ICD-10-CM

## 2014-12-21 DIAGNOSIS — R7989 Other specified abnormal findings of blood chemistry: Secondary | ICD-10-CM

## 2014-12-21 LAB — T3, FREE: T3 FREE: 3.2 pg/mL (ref 2.3–4.2)

## 2014-12-21 LAB — T4, FREE: Free T4: 0.97 ng/dL (ref 0.80–1.80)

## 2014-12-21 NOTE — Progress Notes (Signed)
Subjective:    Patient ID: Taylor Mitchell, female    DOB: 22-Aug-1940, 74 y.o.   MRN: [id number]  HPI    Taylor Mitchell     MRN: [id number]      DOB: [birth year]   HPI Taylor Mitchell is here for follow up and re-evaluation of chronic medical conditions, medication management and review of any available recent lab and radiology data.  Preventive health is updated, specifically  Cancer screening and Immunization.   Questions or concerns regarding consultations or procedures which the PT has had in the interim are  addressed. The PT denies any adverse reactions to current medications since the last visit.  There are no new concerns.  There are no specific complaints   ROS Denies recent fever or chills. Denies sinus pressure, nasal congestion, ear pain or sore throat. Denies chest congestion, productive cough or wheezing. Denies chest pains, palpitations and leg swelling Denies abdominal pain, nausea, vomiting,diarrhea or constipation.   Denies dysuria, frequency, hesitancy or incontinence. Denies joint pain, swelling and limitation in mobility. Denies headaches, seizures, numbness, or tingling. Denies depression, anxiety or insomnia. Denies skin break down or rash.   PE  BP 120/78 mmHg  Pulse 61  Resp 16  Ht 5\' 7"  (1.702 m)  Wt 178 lb (80.74 kg)  BMI 27.87 kg/m2  SpO2 98%  Patient alert and oriented and in no cardiopulmonary distress.  HEENT: No facial asymmetry, EOMI,   oropharynx pink and moist.  Neck supple no JVD, no mass.  Chest: Clear to auscultation bilaterally.  CVS: S1, S2 no murmurs, no S3.Regular rate.  ABD: Soft non tender.   Ext: No edema  MS: Adequate ROM spine, shoulders, hips and knees.  Skin: Intact, no ulcerations or rash noted.  Psych: Good eye contact, normal affect. Memory intact not anxious or depressed appearing.  CNS: CN 2-12 intact, power,  normal throughout.no focal deficits noted.   Assessment & Plan   Hyperlipidemia Improved, pt  applayded on this, no change in management Hyperlipidemia:Low fat diet discussed and encouraged.   Lipid Panel  Lab Results  Component Value Date   CHOL 230* 12/17/2014   HDL 45* 12/17/2014   LDLCALC 154* 12/17/2014   TRIG 154* 12/17/2014   CHOLHDL 5.1 12/17/2014        Prediabetes Patient educated about the importance of limiting  Carbohydrate intake , the need to commit to daily physical activity for a minimum of 30 minutes , and to commit weight loss. The fact that changes in all these areas will reduce or eliminate all together the development of diabetes is stressed.  deteriorated   Diabetic Labs Latest Ref Rng 12/17/2014 09/20/2014 05/14/2014 02/26/2014 10/28/2013  HbA1c <5.7 % 6.2(H) 6.1(H) 6.3(H) 6.6(H) 6.2(H)  Chol 0 - 200 mg/dL 230(H) 274(H) 237(H) 306(H) 319(H)  HDL >=46 mg/dL 45(L) 52 41 43 48  Calc LDL 0 - 99 mg/dL 154(H) 198(H) 175(H) 203(H) 241(H)  Triglycerides <150 mg/dL 154(H) 119 107 299(H) 148  Creatinine 0.50 - 1.10 mg/dL 0.61 - 0.68 - 0.65   BP/Weight 01/03/2015 12/21/2014 10/12/2014 09/22/2014 05/18/2014 03/02/2014 05/02/1659  Systolic BP 600 459 977 414 239 532 023  Diastolic BP 70 78 84 78 72 78 80  Wt. (Lbs) - 178 173.6 172 170 183.4 186  BMI - 27.87 27.18 26.93 26.62 28.72 29.12   No flowsheet data found.     Allergic rhinitis Controlled, no change in medication   Skin lesions, generalized Multiple lesions on posterior chest, dermatology to  evaluate and manage as deemed appropriate  Metabolic syndrome X The increased risk of cardiovascular disease associated with this diagnosis, and the need to consistently work on lifestyle to change this is discussed. Following  a  heart healthy diet ,commitment to 30 minutes of exercise at least 5 days per week, as well as control of blood sugar and cholesterol , and achieving a healthy weight are all the areas to be addressed .   Overweight (BMI 25.0-29.9) Deteriorated. Patient re-educated about  the importance  of commitment to a  minimum of 150 minutes of exercise per week.  The importance of healthy food choices with portion control discussed. Encouraged to start a food diary, count calories and to consider  joining a support group. Sample diet sheets offered. Goals set by the patient for the next several months.   Weight /BMI 12/21/2014 10/12/2014 09/22/2014  WEIGHT 178 lb 173 lb 9.6 oz 172 lb  HEIGHT 5\' 7"  5\' 7"  5\' 7"   BMI 27.87 kg/m2 27.18 kg/m2 26.93 kg/m2    Current exercise per week 90 minutes.   Atypical chest pain Cardiology evaluation negative, normal lexiscan in 10/2014      Review of Systems     Objective:   Physical Exam        Assessment & Plan:

## 2014-12-21 NOTE — Patient Instructions (Addendum)
Annual physica exam in 4.5 month, call if you need me ebfore  Congrats on improved lipids, take zetia every night    Follow low fat diet please, also reduce sweets and carbs blood sugar has increased  Pls reconsider checking bone density , may help to reduce fracture risk if you need treatment   You are referred for [place] of your thyroid gland   You are referred to dermatology re rash on back

## 2014-12-22 ENCOUNTER — Telehealth: Payer: Self-pay | Admitting: Family Medicine

## 2014-12-22 DIAGNOSIS — R7989 Other specified abnormal findings of blood chemistry: Secondary | ICD-10-CM

## 2014-12-22 DIAGNOSIS — E049 Nontoxic goiter, unspecified: Secondary | ICD-10-CM

## 2014-12-22 NOTE — Telephone Encounter (Signed)
Thyroid US entered

## 2014-12-30 ENCOUNTER — Ambulatory Visit (HOSPITAL_COMMUNITY)
Admission: RE | Admit: 2014-12-30 | Discharge: 2014-12-30 | Disposition: A | Discharge status: Home / Self Care | Payer: Medicare Other | Source: Ambulatory Visit | Attending: Family Medicine | Admitting: Family Medicine

## 2014-12-30 DIAGNOSIS — Z1283 Encounter for screening for malignant neoplasm of skin: Secondary | ICD-10-CM | POA: Diagnosis not present

## 2014-12-30 DIAGNOSIS — R7989 Other specified abnormal findings of blood chemistry: Secondary | ICD-10-CM | POA: Insufficient documentation

## 2014-12-30 DIAGNOSIS — E049 Nontoxic goiter, unspecified: Secondary | ICD-10-CM | POA: Diagnosis not present

## 2015-01-03 ENCOUNTER — Telehealth: Payer: Self-pay | Admitting: *Deleted

## 2015-01-03 ENCOUNTER — Ambulatory Visit (INDEPENDENT_AMBULATORY_CARE_PROVIDER_SITE_OTHER): Payer: Medicare Other

## 2015-01-03 VITALS — BP 136/70

## 2015-01-03 DIAGNOSIS — N3000 Acute cystitis without hematuria: Secondary | ICD-10-CM | POA: Diagnosis not present

## 2015-01-03 LAB — POCT URINALYSIS DIPSTICK
BILIRUBIN UA: NEGATIVE
Blood, UA: NEGATIVE
Glucose, UA: NEGATIVE
Ketones, UA: NEGATIVE
LEUKOCYTES UA: NEGATIVE
NITRITE UA: NEGATIVE
PH UA: 6.5
Protein, UA: NEGATIVE
SPEC GRAV UA: 1.015
Urobilinogen, UA: 0.2

## 2015-01-03 NOTE — Telephone Encounter (Signed)
Pt called stating she is getting a urine lab order, pt wants to know when she can pick up the order. Please advise

## 2015-01-03 NOTE — Progress Notes (Signed)
Patient in for urine checked.  Complaining of burning and pressure with urination x 4 days.  Did try otc AZO with some relief.  Urine results normal.  Patient advised to call back if symptoms returned and to push clear fluids as much as possible.

## 2015-01-03 NOTE — Telephone Encounter (Signed)
Spoke with patient and she will come in for a nurse visit.

## 2015-02-20 ENCOUNTER — Other Ambulatory Visit: Payer: Self-pay | Admitting: Family Medicine

## 2015-03-05 NOTE — Assessment & Plan Note (Signed)
Cardiology evaluation negative, normal lexiscan in 10/2014

## 2015-03-05 NOTE — Assessment & Plan Note (Signed)
Improved, pt applayded on this, no change in management Hyperlipidemia:Low fat diet discussed and encouraged.   Lipid Panel  Lab Results  Component Value Date   CHOL 230* 12/17/2014   HDL 45* 12/17/2014   LDLCALC 154* 12/17/2014   TRIG 154* 12/17/2014   CHOLHDL 5.1 12/17/2014

## 2015-03-05 NOTE — Assessment & Plan Note (Signed)
Patient educated about the importance of limiting  Carbohydrate intake , the need to commit to daily physical activity for a minimum of 30 minutes , and to commit weight loss. The fact that changes in all these areas will reduce or eliminate all together the development of diabetes is stressed.  deteriorated   Diabetic Labs Latest Ref Rng 12/17/2014 09/20/2014 05/14/2014 02/26/2014 10/28/2013  HbA1c <5.7 % 6.2(H) 6.1(H) 6.3(H) 6.6(H) 6.2(H)  Chol 0 - 200 mg/dL 230(H) 274(H) 237(H) 306(H) 319(H)  HDL >=46 mg/dL 45(L) 52 41 43 48  Calc LDL 0 - 99 mg/dL 154(H) 198(H) 175(H) 203(H) 241(H)  Triglycerides <150 mg/dL 154(H) 119 107 299(H) 148  Creatinine 0.50 - 1.10 mg/dL 0.61 - 0.68 - 0.65   BP/Weight 01/03/2015 12/21/2014 10/12/2014 09/22/2014 05/18/2014 03/02/2014 11/09/760  Systolic BP 263 335 456 256 389 373 428  Diastolic BP 70 78 84 78 72 78 80  Wt. (Lbs) - 178 173.6 172 170 183.4 186  BMI - 27.87 27.18 26.93 26.62 28.72 29.12   No flowsheet data found.

## 2015-03-05 NOTE — Assessment & Plan Note (Signed)
Multiple lesions on posterior chest, dermatology to evaluate and manage as deemed appropriate

## 2015-03-05 NOTE — Assessment & Plan Note (Signed)
The increased risk of cardiovascular disease associated with this diagnosis, and the need to consistently work on lifestyle to change this is discussed. Following  a  heart healthy diet ,commitment to 30 minutes of exercise at least 5 days per week, as well as control of blood sugar and cholesterol , and achieving a healthy weight are all the areas to be addressed .  

## 2015-03-05 NOTE — Assessment & Plan Note (Signed)
Controlled, no change in medication  

## 2015-03-05 NOTE — Assessment & Plan Note (Signed)
Deteriorated. Patient re-educated about  the importance of commitment to a  minimum of 150 minutes of exercise per week.  The importance of healthy food choices with portion control discussed. Encouraged to start a food diary, count calories and to consider  joining a support group. Sample diet sheets offered. Goals set by the patient for the next several months.   Weight /BMI 12/21/2014 10/12/2014 09/22/2014  WEIGHT 178 lb 173 lb 9.6 oz 172 lb  HEIGHT 5\' 7"  5\' 7"  5\' 7"   BMI 27.87 kg/m2 27.18 kg/m2 26.93 kg/m2    Current exercise per week 90 minutes.

## 2015-04-14 ENCOUNTER — Telehealth: Payer: Self-pay | Admitting: Family Medicine

## 2015-04-14 ENCOUNTER — Other Ambulatory Visit: Payer: Self-pay

## 2015-04-14 MED ORDER — ALPRAZOLAM 0.25 MG PO TABS
0.2500 mg | ORAL_TABLET | Freq: Two times a day (BID) | ORAL | Status: DC
Start: 2015-04-14 — End: 2015-09-11

## 2015-04-14 NOTE — Telephone Encounter (Signed)
Med refilled.

## 2015-04-14 NOTE — Telephone Encounter (Signed)
Patient is requesting a refill on ALPRAZolam (XANAX) 0.25 MG tablet please advise?

## 2015-04-25 ENCOUNTER — Other Ambulatory Visit: Payer: Self-pay

## 2015-04-25 ENCOUNTER — Telehealth: Payer: Self-pay | Admitting: *Deleted

## 2015-04-25 MED ORDER — MECLIZINE HCL 25 MG PO TABS: 25.0000 mg | ORAL_TABLET | ORAL | Status: DC | PRN

## 2015-04-25 NOTE — Telephone Encounter (Signed)
Pt called stating she needs some more of her dizzy pills pt said she never takes them and her last ones are from 2014 pt states she is feeling very dizzy and she needs them, pt is requesting for them to be refilled soon as possible to [facility]

## 2015-04-25 NOTE — Telephone Encounter (Signed)
Called and notified patient that medication has been sent to pharmacy.

## 2015-05-02 ENCOUNTER — Ambulatory Visit (INDEPENDENT_AMBULATORY_CARE_PROVIDER_SITE_OTHER): Payer: Medicare Other

## 2015-05-02 DIAGNOSIS — Z23 Encounter for immunization: Secondary | ICD-10-CM | POA: Diagnosis not present

## 2015-05-16 DIAGNOSIS — M545 Low back pain: Secondary | ICD-10-CM | POA: Diagnosis not present

## 2015-05-16 DIAGNOSIS — M546 Pain in thoracic spine: Secondary | ICD-10-CM | POA: Diagnosis not present

## 2015-05-16 DIAGNOSIS — M9903 Segmental and somatic dysfunction of lumbar region: Secondary | ICD-10-CM | POA: Diagnosis not present

## 2015-05-16 DIAGNOSIS — M9902 Segmental and somatic dysfunction of thoracic region: Secondary | ICD-10-CM | POA: Diagnosis not present

## 2015-05-17 ENCOUNTER — Other Ambulatory Visit: Payer: Self-pay | Admitting: Family Medicine

## 2015-05-17 DIAGNOSIS — M9902 Segmental and somatic dysfunction of thoracic region: Secondary | ICD-10-CM | POA: Diagnosis not present

## 2015-05-17 DIAGNOSIS — M545 Low back pain: Secondary | ICD-10-CM | POA: Diagnosis not present

## 2015-05-17 DIAGNOSIS — M546 Pain in thoracic spine: Secondary | ICD-10-CM | POA: Diagnosis not present

## 2015-05-17 DIAGNOSIS — M9903 Segmental and somatic dysfunction of lumbar region: Secondary | ICD-10-CM | POA: Diagnosis not present

## 2015-06-01 ENCOUNTER — Encounter: Payer: Medicare Other | Admitting: Family Medicine

## 2015-06-07 DIAGNOSIS — E785 Hyperlipidemia, unspecified: Secondary | ICD-10-CM | POA: Diagnosis not present

## 2015-06-07 DIAGNOSIS — R7309 Other abnormal glucose: Secondary | ICD-10-CM | POA: Diagnosis not present

## 2015-06-07 DIAGNOSIS — D539 Nutritional anemia, unspecified: Secondary | ICD-10-CM | POA: Diagnosis not present

## 2015-06-07 DIAGNOSIS — R799 Abnormal finding of blood chemistry, unspecified: Secondary | ICD-10-CM | POA: Diagnosis not present

## 2015-06-07 DIAGNOSIS — R7989 Other specified abnormal findings of blood chemistry: Secondary | ICD-10-CM | POA: Diagnosis not present

## 2015-06-07 LAB — CBC WITH DIFFERENTIAL/PLATELET
Basophils Absolute: 0 10*3/uL (ref 0.0–0.1)
Basophils Relative: 0 % (ref 0–1)
Eosinophils Absolute: 0.2 10*3/uL (ref 0.0–0.7)
Eosinophils Relative: 3 % (ref 0–5)
HCT: 41.9 % (ref 36.0–46.0)
HEMOGLOBIN: 14.1 g/dL (ref 12.0–15.0)
LYMPHS PCT: 38 % (ref 12–46)
Lymphs Abs: 2.1 10*3/uL (ref 0.7–4.0)
MCH: 31 pg (ref 26.0–34.0)
MCHC: 33.7 g/dL (ref 30.0–36.0)
MCV: 92.1 fL (ref 78.0–100.0)
MPV: 11.1 fL (ref 8.6–12.4)
Monocytes Absolute: 0.4 10*3/uL (ref 0.1–1.0)
Monocytes Relative: 8 % (ref 3–12)
NEUTROS ABS: 2.8 10*3/uL (ref 1.7–7.7)
NEUTROS PCT: 51 % (ref 43–77)
Platelets: 245 10*3/uL (ref 150–400)
RBC: 4.55 MIL/uL (ref 3.87–5.11)
RDW: 13.5 % (ref 11.5–15.5)
WBC: 5.5 10*3/uL (ref 4.0–10.5)

## 2015-06-08 LAB — HEPATIC FUNCTION PANEL
ALBUMIN: 4.2 g/dL (ref 3.6–5.1)
ALT: 14 U/L (ref 6–29)
AST: 12 U/L (ref 10–35)
Alkaline Phosphatase: 50 U/L (ref 33–130)
Bilirubin, Direct: 0.1 mg/dL (ref <=0.2)
Indirect Bilirubin: 0.6 mg/dL (ref 0.2–1.2)
Total Bilirubin: 0.7 mg/dL (ref 0.2–1.2)
Total Protein: 6.6 g/dL (ref 6.1–8.1)

## 2015-06-08 LAB — LIPID PANEL
CHOL/HDL RATIO: 6.4 ratio — AB (ref <=5.0)
Cholesterol: 281 mg/dL — ABNORMAL HIGH (ref 125–200)
HDL: 44 mg/dL — AB (ref >=46)
LDL CALC: 209 mg/dL — AB (ref <130)
Triglycerides: 142 mg/dL (ref <150)
VLDL: 28 mg/dL (ref <30)

## 2015-06-08 LAB — TSH: TSH: 4.203 u[IU]/mL (ref 0.350–4.500)

## 2015-06-08 LAB — HEMOGLOBIN A1C
HEMOGLOBIN A1C: 6.4 % — AB (ref <5.7)
Mean Plasma Glucose: 137 mg/dL — ABNORMAL HIGH (ref <117)

## 2015-06-09 ENCOUNTER — Encounter: Payer: Self-pay | Admitting: Family Medicine

## 2015-06-09 ENCOUNTER — Ambulatory Visit (INDEPENDENT_AMBULATORY_CARE_PROVIDER_SITE_OTHER): Payer: Medicare Other | Admitting: Family Medicine

## 2015-06-09 ENCOUNTER — Other Ambulatory Visit (HOSPITAL_COMMUNITY)
Admission: RE | Admit: 2015-06-09 | Discharge: 2015-06-09 | Disposition: A | Discharge status: Home / Self Care | Payer: Medicare Other | Source: Ambulatory Visit | Attending: Family Medicine | Admitting: Family Medicine

## 2015-06-09 VITALS — BP 120/80 | HR 69 | Resp 16 | Ht 66.25 in | Wt 180.1 lb

## 2015-06-09 DIAGNOSIS — Z124 Encounter for screening for malignant neoplasm of cervix: Secondary | ICD-10-CM

## 2015-06-09 DIAGNOSIS — R7303 Prediabetes: Secondary | ICD-10-CM

## 2015-06-09 DIAGNOSIS — E8881 Metabolic syndrome: Secondary | ICD-10-CM

## 2015-06-09 DIAGNOSIS — Z01419 Encounter for gynecological examination (general) (routine) without abnormal findings: Secondary | ICD-10-CM | POA: Diagnosis not present

## 2015-06-09 DIAGNOSIS — Z1211 Encounter for screening for malignant neoplasm of colon: Secondary | ICD-10-CM | POA: Diagnosis not present

## 2015-06-09 DIAGNOSIS — Z0000 Encounter for general adult medical examination without abnormal findings: Secondary | ICD-10-CM

## 2015-06-09 LAB — POC HEMOCCULT BLD/STL (OFFICE/1-CARD/DIAGNOSTIC): Fecal Occult Blood, POC: NEGATIVE

## 2015-06-09 NOTE — Patient Instructions (Addendum)
Annual wellness in 3.5 month, call if you need me sooner  Please work on increasing vegetable in your diet so cholesterol and sugar improve  Fasting lipid, hBA1C in 3.5 months  Thanks for choosing [facility], we consider it a privelige to serve you.

## 2015-06-09 NOTE — Progress Notes (Signed)
   Subjective:    Patient ID: Taylor Mitchell, female    DOB: 06-01-1941, 74 y.o.   MRN: [id number]  HPI  Patient is in for annual physical exam.  Recent labs, if available are reviewed. Immunization is reviewed , and  updated if needed.   Review of Systems See HPI      Objective:   Physical Exam BP 120/80 mmHg  Pulse 69  Resp 16  Ht 5' 6.25" (1.683 m)  Wt 180 lb 1.9 oz (81.702 kg)  BMI 28.84 kg/m2  SpO2 97%  Pleasant well nourished female, alert and oriented x 3, in no cardio-pulmonary distress. Afebrile. HEENT No facial trauma or asymetry. Sinuses non tender.  Extra occullar muscles intact, pupils equally reactive to light. External ears normal, tympanic membranes clear. Oropharynx moist, no exudate, good dentition. Neck: supple, no adenopathy,JVD or thyromegaly.No bruits.  Chest: Clear to ascultation bilaterally.No crackles or wheezes. Non tender to palpation  Breast: No asymetry,no masses or lumps. No tenderness. No nipple discharge or inversion. No axillary or supraclavicular adenopathy  Cardiovascular system; Heart sounds normal,  S1 and  S2 ,no S3.  No murmur, or thrill. Apical beat not displaced Peripheral pulses normal.  Abdomen: Soft, non tender, no organomegaly or masses. No bruits. Bowel sounds normal. No guarding, tenderness or rebound.  Rectal:  Normal sphincter tone. No mass.No rectal masses.  Guaiac negative stool.  GU: External genitalia normal female genitalia , female distribution of hair. No lesions. Urethral meatus normal in size, no  Prolapse, no lesions visibly  Present. Bladder non tender. Vagina pink and moist , with no visible lesions , discharge present . Adequate pelvic support no  cystocele or rectocele noted Cervix pink and appears healthy, no lesions or ulcerations noted, no discharge noted from os Uterus normal size, no adnexal masses, no cervical motion or adnexal tenderness.   Musculoskeletal exam: Full ROM of  spine, hips , shoulders and knees. No deformity ,swelling or crepitus noted. No muscle wasting or atrophy.   Neurologic: Cranial nerves 2 to 12 intact. Power, tone ,sensation and reflexes normal throughout. No disturbance in gait. No tremor.  Skin: Intact, no ulceration, erythema , scaling or rash noted. Pigmentation normal throughout  Psych; Normal mood and affect. Judgement and concentration normal         Assessment & Plan:  Annual physical exam Annual exam as documented. Counseling done  re healthy lifestyle involving commitment to 150 minutes exercise per week, heart healthy diet, and attaining healthy weight.The importance of adequate sleep also discussed. Regular seat belt use and home safety, is also discussed. Changes in health habits are decided on by the patient with goals and time frames  set for achieving them. Immunization and cancer screening needs are specifically addressed at this visit.

## 2015-06-11 DIAGNOSIS — Z0000 Encounter for general adult medical examination without abnormal findings: Secondary | ICD-10-CM | POA: Insufficient documentation

## 2015-06-11 NOTE — Assessment & Plan Note (Signed)

## 2015-06-14 LAB — CYTOLOGY - PAP

## 2015-06-16 ENCOUNTER — Other Ambulatory Visit: Payer: Self-pay

## 2015-06-16 MED ORDER — ACCU-CHEK MULTICLIX LANCETS MISC: Status: DC

## 2015-06-16 MED ORDER — ACCU-CHEK FASTCLIX LANCET KIT: PACK | Status: DC

## 2015-06-16 MED ORDER — ACCU-CHEK FASTCLIX LANCETS MISC: Status: DC

## 2015-06-27 ENCOUNTER — Other Ambulatory Visit: Payer: Self-pay | Admitting: Family Medicine

## 2015-06-30 ENCOUNTER — Telehealth: Payer: Self-pay | Admitting: Family Medicine

## 2015-06-30 ENCOUNTER — Other Ambulatory Visit: Payer: Self-pay | Admitting: Family Medicine

## 2015-06-30 NOTE — Telephone Encounter (Signed)
Patient is requesting a Rx sent to her pharmacy for her Lancets to check her blood sugars, please advise?

## 2015-06-30 NOTE — Telephone Encounter (Signed)
Medication refilled

## 2015-08-24 DIAGNOSIS — M9902 Segmental and somatic dysfunction of thoracic region: Secondary | ICD-10-CM | POA: Diagnosis not present

## 2015-08-24 DIAGNOSIS — M9903 Segmental and somatic dysfunction of lumbar region: Secondary | ICD-10-CM | POA: Diagnosis not present

## 2015-08-24 DIAGNOSIS — M545 Low back pain: Secondary | ICD-10-CM | POA: Diagnosis not present

## 2015-08-24 DIAGNOSIS — M546 Pain in thoracic spine: Secondary | ICD-10-CM | POA: Diagnosis not present

## 2015-09-11 ENCOUNTER — Other Ambulatory Visit: Payer: Self-pay | Admitting: Family Medicine

## 2015-09-13 DIAGNOSIS — R7303 Prediabetes: Secondary | ICD-10-CM | POA: Diagnosis not present

## 2015-09-13 DIAGNOSIS — R7309 Other abnormal glucose: Secondary | ICD-10-CM | POA: Diagnosis not present

## 2015-09-13 DIAGNOSIS — E785 Hyperlipidemia, unspecified: Secondary | ICD-10-CM | POA: Diagnosis not present

## 2015-09-13 DIAGNOSIS — E8881 Metabolic syndrome: Secondary | ICD-10-CM | POA: Diagnosis not present

## 2015-09-14 LAB — LIPID PANEL
Cholesterol: 314 mg/dL — ABNORMAL HIGH (ref 125–200)
HDL: 54 mg/dL
LDL Cholesterol: 233 mg/dL — ABNORMAL HIGH
Total CHOL/HDL Ratio: 5.8 ratio — ABNORMAL HIGH
Triglycerides: 136 mg/dL
VLDL: 27 mg/dL

## 2015-09-14 LAB — HEMOGLOBIN A1C
Hgb A1c MFr Bld: 6.4 % — ABNORMAL HIGH
Mean Plasma Glucose: 137 mg/dL — ABNORMAL HIGH

## 2015-09-19 ENCOUNTER — Encounter: Payer: Self-pay | Admitting: Family Medicine

## 2015-09-19 ENCOUNTER — Ambulatory Visit (INDEPENDENT_AMBULATORY_CARE_PROVIDER_SITE_OTHER): Payer: Medicare Other | Admitting: Family Medicine

## 2015-09-19 VITALS — BP 106/76 | HR 66 | Resp 18 | Ht 66.25 in | Wt 185.0 lb

## 2015-09-19 DIAGNOSIS — E559 Vitamin D deficiency, unspecified: Secondary | ICD-10-CM

## 2015-09-19 DIAGNOSIS — E785 Hyperlipidemia, unspecified: Secondary | ICD-10-CM | POA: Diagnosis not present

## 2015-09-19 DIAGNOSIS — Z0000 Encounter for general adult medical examination without abnormal findings: Secondary | ICD-10-CM | POA: Diagnosis not present

## 2015-09-19 DIAGNOSIS — Z23 Encounter for immunization: Secondary | ICD-10-CM | POA: Diagnosis not present

## 2015-09-19 NOTE — Progress Notes (Signed)
Subjective:    Patient ID: Taylor Mitchell, female    DOB: Jun 27, 1941, 75 y.o.   MRN: [id number]  HPI  Preventive Screening-Counseling & Management   Patient present here today for a Medicare annual wellness visit.   Current Problems (verified)   Medications Prior to Visit Allergies (verified)   PAST HISTORY  Family History (updated)  Social History Married with 2 children (1 son and 1 daughter) formerly worked in [employer] fund[employer]    Risk Factors  Current exercise habits:  Nothing on regular basis but works outside   Dietary issues discussed:  Heart healthy low fat diet;  Will work on cutting back on carbs    Cardiac risk factors:   Depression Screen  (Note: if answer to either of the following is "Yes", a more complete depression screening is indicated)   Over the past two weeks, have you felt down, depressed or hopeless? No  Over the past two weeks, have you felt little interest or pleasure in doing things? No  Have you lost interest or pleasure in daily life? No  Do you often feel hopeless? No  Do you cry easily over simple problems? No   Activities of Daily Living  In your present state of health, do you have any difficulty performing the following activities?  Driving?: No Managing money?: No Feeding yourself?:No Getting from bed to chair?:No Climbing a flight of stairs?:No Preparing food and eating?:No Bathing or showering?:No Getting dressed?:No Getting to the toilet?:No Using the toilet?:No Moving around from place to place?: No  Fall Risk Assessment In the past year have you fallen or had a near fall?: Yes Are you currently taking any medications that make you dizzy?:No   Hearing Difficulties:  Yes, wears hearing aid in left ear  Do you often ask people to speak up or repeat themselves?: Yes Do you experience ringing or noises in your ears?:No Do you have difficulty understanding soft or whispered voices?: Yes  Cognitive Testing  Alert? Yes Normal  Appearance?Yes  Oriented to person? Yes Place? Yes  Time? Yes  Displays appropriate judgment?Yes  Can read the correct time from a watch face? yes Are you having problems remembering things?No age appropriate   Advanced Directives have been discussed with the patient?Yes and brochure/forms provided    List the Names of Other Physician/Practitioners you currently use: careteams updated    Indicate any recent Medical Services you may have received from other than [facility] providers in the past year (date may be approximate).   Assessment:    Annual Wellness Exam   Plan:    Patient Instructions (the written plan) was given to the patient.  Medicare Attestation  I have personally reviewed:  The patient's medical and social history  Their use of alcohol, tobacco or illicit drugs  Their current medications and supplements  The patient's functional ability including ADLs,fall risks, home safety risks, cognitive, and hearing and visual impairment  Diet and physical activities  Evidence for depression or mood disorders  The patient's weight, height, BMI, and visual acuity have been recorded in the chart. I have made referrals, counseling, and provided education to the patient based on review of the above and I have provided the patient with a written personalized care plan for preventive services.     Review of Systems     Objective:   Physical Exam  BP 106/76 mmHg  Pulse 66  Resp 18  Ht 5' 6.25" (1.683 m)  Wt 185 lb (83.915 kg)  BMI  29.63 kg/m2  SpO2 97%       Assessment & Plan:  Medicare annual wellness visit, subsequent Annual exam as documented. Counseling done  re healthy lifestyle involving commitment to 150 minutes exercise per week, heart healthy diet, and attaining healthy weight.The importance of adequate sleep also discussed. Regular seat belt use and home safety, is also discussed. Changes in health habits are decided on by the patient with goals and time frames   set for achieving them. Immunization and cancer screening needs are specifically addressed at this visit.   Need for 23-polyvalent pneumococcal polysaccharide vaccine After obtaining informed consent, the vaccine is  administered by LPN.

## 2015-09-19 NOTE — Patient Instructions (Addendum)
F/u in 4.5 month, call if you need me befoe  PLEASE reduce fried and fatty foods , cheese, butter, eggs, you NEED to do this  Pneumonia 23 today    Fasting lipid, cmp, and vit D in 4.5 month  Mammogram will be ordered to facility of your choice if needed

## 2015-09-21 DIAGNOSIS — Z23 Encounter for immunization: Secondary | ICD-10-CM | POA: Insufficient documentation

## 2015-09-21 DIAGNOSIS — Z0000 Encounter for general adult medical examination without abnormal findings: Secondary | ICD-10-CM | POA: Insufficient documentation

## 2015-09-21 NOTE — Assessment & Plan Note (Signed)
After obtaining informed consent, the vaccine is  administered by LPN.  

## 2015-09-21 NOTE — Assessment & Plan Note (Signed)

## 2015-10-06 DIAGNOSIS — M546 Pain in thoracic spine: Secondary | ICD-10-CM | POA: Diagnosis not present

## 2015-10-06 DIAGNOSIS — M9902 Segmental and somatic dysfunction of thoracic region: Secondary | ICD-10-CM | POA: Diagnosis not present

## 2015-10-06 DIAGNOSIS — M9903 Segmental and somatic dysfunction of lumbar region: Secondary | ICD-10-CM | POA: Diagnosis not present

## 2015-10-06 DIAGNOSIS — M545 Low back pain: Secondary | ICD-10-CM | POA: Diagnosis not present

## 2015-10-18 DIAGNOSIS — M9902 Segmental and somatic dysfunction of thoracic region: Secondary | ICD-10-CM | POA: Diagnosis not present

## 2015-10-18 DIAGNOSIS — M9903 Segmental and somatic dysfunction of lumbar region: Secondary | ICD-10-CM | POA: Diagnosis not present

## 2015-10-18 DIAGNOSIS — M545 Low back pain: Secondary | ICD-10-CM | POA: Diagnosis not present

## 2015-10-18 DIAGNOSIS — M546 Pain in thoracic spine: Secondary | ICD-10-CM | POA: Diagnosis not present

## 2015-10-24 ENCOUNTER — Other Ambulatory Visit: Payer: Self-pay | Admitting: Family Medicine

## 2015-10-24 DIAGNOSIS — Z1231 Encounter for screening mammogram for malignant neoplasm of breast: Secondary | ICD-10-CM

## 2015-11-01 ENCOUNTER — Ambulatory Visit
Admission: RE | Admit: 2015-11-01 | Discharge: 2015-11-01 | Disposition: A | Discharge status: Home / Self Care | Payer: Medicare Other | Source: Ambulatory Visit | Attending: Family Medicine | Admitting: Family Medicine

## 2015-11-01 ENCOUNTER — Other Ambulatory Visit: Payer: Self-pay | Admitting: Family Medicine

## 2015-11-01 DIAGNOSIS — Z1231 Encounter for screening mammogram for malignant neoplasm of breast: Secondary | ICD-10-CM

## 2015-11-09 DIAGNOSIS — M546 Pain in thoracic spine: Secondary | ICD-10-CM | POA: Diagnosis not present

## 2015-11-09 DIAGNOSIS — M9902 Segmental and somatic dysfunction of thoracic region: Secondary | ICD-10-CM | POA: Diagnosis not present

## 2015-11-09 DIAGNOSIS — M545 Low back pain: Secondary | ICD-10-CM | POA: Diagnosis not present

## 2015-11-09 DIAGNOSIS — M9903 Segmental and somatic dysfunction of lumbar region: Secondary | ICD-10-CM | POA: Diagnosis not present

## 2015-12-29 ENCOUNTER — Encounter: Payer: Self-pay | Admitting: Family Medicine

## 2015-12-29 ENCOUNTER — Ambulatory Visit (INDEPENDENT_AMBULATORY_CARE_PROVIDER_SITE_OTHER): Payer: Medicare Other | Admitting: Family Medicine

## 2015-12-29 ENCOUNTER — Ambulatory Visit (HOSPITAL_COMMUNITY)
Admission: RE | Admit: 2015-12-29 | Discharge: 2015-12-29 | Disposition: A | Discharge status: Home / Self Care | Payer: Medicare Other | Source: Ambulatory Visit | Attending: Family Medicine | Admitting: Family Medicine

## 2015-12-29 VITALS — BP 122/78 | HR 63 | Resp 16 | Ht 66.0 in | Wt 187.0 lb

## 2015-12-29 DIAGNOSIS — M79671 Pain in right foot: Secondary | ICD-10-CM | POA: Diagnosis not present

## 2015-12-29 DIAGNOSIS — M21611 Bunion of right foot: Secondary | ICD-10-CM | POA: Diagnosis not present

## 2015-12-29 DIAGNOSIS — Z1211 Encounter for screening for malignant neoplasm of colon: Secondary | ICD-10-CM | POA: Diagnosis not present

## 2015-12-29 DIAGNOSIS — E663 Overweight: Secondary | ICD-10-CM | POA: Diagnosis not present

## 2015-12-29 DIAGNOSIS — M7731 Calcaneal spur, right foot: Secondary | ICD-10-CM | POA: Diagnosis not present

## 2015-12-29 DIAGNOSIS — M2011 Hallux valgus (acquired), right foot: Secondary | ICD-10-CM | POA: Insufficient documentation

## 2015-12-29 NOTE — Patient Instructions (Addendum)
F/u as before   You are referred to podiatrist and please get x ray of foot today  You are referred to Dr Arnoldo Morale for colonoscopy due to h/o polyps  Heel pain I believe is from heel spur, use tylenol 500 mg daily until further notice  Thank you  for choosing [facility]. We consider it a privelige to serve you.  Delivering excellent health care in a caring and  compassionate way is our goal.  Partnering with you,  so that together we can achieve this goal is our strategy.

## 2015-12-29 NOTE — Progress Notes (Signed)
   Subjective:    Patient ID: Taylor Mitchell, female    DOB: September 08, 1940, 75 y.o.   MRN: [id number]  HPI 2 month h/o worsening rih heel pain, worse in the morning when she first awakens , has to tiptoe and partially weight bear before pain eases some Hoped it would resolve, tried to see podiatry but was turned down, here for help   Review of Systems    See HPI Denies recent fever or chills. Denies sinus pressure, nasal congestion, ear pain or sore throat. Denies chest congestion, productive cough or wheezing. Denies chest pains, palpitations and leg swelling . Denies headaches, seizures, numbness, or tingling.     Objective:   Physical Exam BP 122/78 mmHg  Pulse 63  Resp 16  Ht 5\' 6"  (1.676 m)  Wt 187 lb (84.823 kg)  BMI 30.20 kg/m2  SpO2 96% Patient alert and oriented and in no cardiopulmonary distress.  HEENT: No facial asymmetry, EOMI,   oropharynx pink and moist.  Neck supple no JVD, no mass.  Chest: Clear to auscultation bilaterally.  CVS: S1, S2 no murmurs, no S3.Regular rate.  ABD: Soft non tender.   Ext: No edema  MS: Adequate ROM spine, shoulders, hips and knees.Tender on palpation over right heel, no palpable nodule , also somewhat tender over instep and in achilees tendon area  Skin: Intact, no ulcerations or rash noted.  Psych: Good eye contact, normal affect. Memory intact not anxious or depressed appearing.  CNS: CN 2-12 intact, power,  normal throughout.no focal deficits noted.        Assessment & Plan:  Pain of right heel 2 month h/o worsening pain , clinically c/w heel spurs. Xray of foot, heel cups and tylenol recommended and refer to podiatry for definitive management  Overweight (BMI 25.0-29.9) Deteriorated. Patient re-educated about  the importance of commitment to a  minimum of 150 minutes of exercise per week.  The importance of healthy food choices with portion control discussed. Encouraged to start a food diary, count calories and  to consider  joining a support group. Sample diet sheets offered. Goals set by the patient for the next several months.   Weight /BMI 12/29/2015 09/19/2015 06/09/2015  WEIGHT 187 lb 185 lb 180 lb 1.9 oz  HEIGHT 5\' 6"  5' 6.25" 5' 6.25"  BMI 30.2 kg/m2 29.63 kg/m2 28.84 kg/m2    Current exercise per week 90 minutes.

## 2016-01-01 NOTE — Assessment & Plan Note (Signed)
Deteriorated. Patient re-educated about  the importance of commitment to a  minimum of 150 minutes of exercise per week.  The importance of healthy food choices with portion control discussed. Encouraged to start a food diary, count calories and to consider  joining a support group. Sample diet sheets offered. Goals set by the patient for the next several months.   Weight /BMI 12/29/2015 09/19/2015 06/09/2015  WEIGHT 187 lb 185 lb 180 lb 1.9 oz  HEIGHT 5\' 6"  5' 6.25" 5' 6.25"  BMI 30.2 kg/m2 29.63 kg/m2 28.84 kg/m2    Current exercise per week 90 minutes.

## 2016-01-01 NOTE — Assessment & Plan Note (Signed)
2 month h/o worsening pain , clinically c/w heel spurs. Xray of foot, heel cups and tylenol recommended and refer to podiatry for definitive management

## 2016-01-12 DIAGNOSIS — M79671 Pain in right foot: Secondary | ICD-10-CM | POA: Diagnosis not present

## 2016-01-12 DIAGNOSIS — M722 Plantar fascial fibromatosis: Secondary | ICD-10-CM | POA: Diagnosis not present

## 2016-01-26 DIAGNOSIS — E559 Vitamin D deficiency, unspecified: Secondary | ICD-10-CM | POA: Diagnosis not present

## 2016-01-26 DIAGNOSIS — E785 Hyperlipidemia, unspecified: Secondary | ICD-10-CM | POA: Diagnosis not present

## 2016-01-27 LAB — COMPREHENSIVE METABOLIC PANEL
ALT: 14 U/L (ref 6–29)
AST: 13 U/L (ref 10–35)
Albumin: 4.2 g/dL (ref 3.6–5.1)
Alkaline Phosphatase: 49 U/L (ref 33–130)
BUN: 16 mg/dL (ref 7–25)
CHLORIDE: 105 mmol/L (ref 98–110)
CO2: 25 mmol/L (ref 20–31)
CREATININE: 0.63 mg/dL (ref 0.60–0.93)
Calcium: 9.2 mg/dL (ref 8.6–10.4)
GLUCOSE: 126 mg/dL — AB (ref 65–99)
POTASSIUM: 4.5 mmol/L (ref 3.5–5.3)
SODIUM: 139 mmol/L (ref 135–146)
Total Bilirubin: 0.6 mg/dL (ref 0.2–1.2)
Total Protein: 6.5 g/dL (ref 6.1–8.1)

## 2016-01-27 LAB — LIPID PANEL
CHOLESTEROL: 280 mg/dL — AB (ref 125–200)
HDL: 54 mg/dL (ref >=46)
LDL Cholesterol: 197 mg/dL — ABNORMAL HIGH (ref <130)
TRIGLYCERIDES: 144 mg/dL (ref <150)
Total CHOL/HDL Ratio: 5.2 Ratio — ABNORMAL HIGH (ref <=5.0)
VLDL: 29 mg/dL (ref <30)

## 2016-01-27 LAB — VITAMIN D 25 HYDROXY (VIT D DEFICIENCY, FRACTURES): Vit D, 25-Hydroxy: 21 ng/mL — ABNORMAL LOW (ref 30–100)

## 2016-01-31 ENCOUNTER — Ambulatory Visit (INDEPENDENT_AMBULATORY_CARE_PROVIDER_SITE_OTHER): Payer: Medicare Other | Admitting: Family Medicine

## 2016-01-31 ENCOUNTER — Encounter: Payer: Self-pay | Admitting: Family Medicine

## 2016-01-31 VITALS — BP 112/72 | HR 65 | Resp 16 | Ht 66.0 in | Wt 191.0 lb

## 2016-01-31 DIAGNOSIS — E559 Vitamin D deficiency, unspecified: Secondary | ICD-10-CM | POA: Diagnosis not present

## 2016-01-31 DIAGNOSIS — R7989 Other specified abnormal findings of blood chemistry: Secondary | ICD-10-CM

## 2016-01-31 DIAGNOSIS — E8881 Metabolic syndrome: Secondary | ICD-10-CM

## 2016-01-31 DIAGNOSIS — R7303 Prediabetes: Secondary | ICD-10-CM

## 2016-01-31 DIAGNOSIS — J3089 Other allergic rhinitis: Secondary | ICD-10-CM

## 2016-01-31 DIAGNOSIS — M79671 Pain in right foot: Secondary | ICD-10-CM

## 2016-01-31 DIAGNOSIS — E785 Hyperlipidemia, unspecified: Secondary | ICD-10-CM | POA: Diagnosis not present

## 2016-01-31 DIAGNOSIS — E663 Overweight: Secondary | ICD-10-CM

## 2016-01-31 DIAGNOSIS — M546 Pain in thoracic spine: Secondary | ICD-10-CM | POA: Diagnosis not present

## 2016-01-31 DIAGNOSIS — F411 Generalized anxiety disorder: Secondary | ICD-10-CM

## 2016-01-31 DIAGNOSIS — Z86010 Personal history of colonic polyps: Secondary | ICD-10-CM | POA: Diagnosis not present

## 2016-01-31 DIAGNOSIS — M545 Low back pain: Secondary | ICD-10-CM | POA: Diagnosis not present

## 2016-01-31 DIAGNOSIS — M9903 Segmental and somatic dysfunction of lumbar region: Secondary | ICD-10-CM | POA: Diagnosis not present

## 2016-01-31 DIAGNOSIS — M9902 Segmental and somatic dysfunction of thoracic region: Secondary | ICD-10-CM | POA: Diagnosis not present

## 2016-01-31 NOTE — Patient Instructions (Addendum)
Annual physical exam Oct 30 or after, call if you need me sooner  Congrats on improved cholesterol  All the best with colonoscopy  Fasting lipid, cmp and eGFr, HBA1C, TSH, CBC and Vit D Oct 27 or after  Thankful foot is improving, keep the process going  Commit to vitamin D 800 IU or 1000 IU every day  Thank you  for choosing [facility]. We consider it a privelige to serve you.  Delivering excellent health care in a caring and  compassionate way is our goal.  Partnering with you,  so that together we can achieve this goal is our strategy.

## 2016-01-31 NOTE — Progress Notes (Signed)
   Subjective:    Patient ID: Taylor Mitchell, female    DOB: March 23, 1941, 75 y.o.   MRN: [id number]  HPI    Review of Systems     Objective:   Physical Exam        Assessment & Plan:

## 2016-02-02 DIAGNOSIS — M722 Plantar fascial fibromatosis: Secondary | ICD-10-CM | POA: Diagnosis not present

## 2016-02-02 DIAGNOSIS — M79671 Pain in right foot: Secondary | ICD-10-CM | POA: Diagnosis not present

## 2016-02-02 NOTE — H&P (Signed)
  NTS SOAP Note  Vital Signs:  Vitals as of: [id number]: Systolic [id number]: Diastolic 71: Heart Rate 61: Temp 98.56F (Temporal): Height 12ft 7in: Weight 192Lbs 0 Ounces: BMI 30.07   BMI : 30.07 kg/m2  Subjective: This 75 year old female presents for of a personal h/o colon polyps.  Last had a colonoscopy five years.  Has had multiple TCS, intermittently requiring polypectomies.  Denies any recent GI complaints.  No constipation, diarrhea, weight loss, blood in stools, family h/o colon cancer.  Review of Symptoms:  Constitutional:negative Head:negative Eyes:negative sinus problems Cardiovascular:negative Respiratory:negative Gastrointestinnegative Genitourinary:negative neck, back pain Skin:negative Hematolgic/Lymphatic:negative Allergic/Immunologic:negative   Past Medical History:Reviewed  Past Medical History  Surgical History: oophorectomy, herniorrhaphy Medical Problems: anxiety Allergies: nkda Medications: baby asa, fish oil, xanax   Social History:Reviewed  Social History  Preferred Language: English Race:  White Ethnicity: Not Hispanic / Latino Age: 75 year Marital Status:  S Alcohol: no   Smoking Status: Former smoker reviewed on 01/31/2016 Started Date:  Stopped Date:  Functional Status reviewed on 01/31/2016 ------------------------------------------------ Bathing: Normal Cooking: Normal Dressing: Normal Driving: Normal Eating: Normal Managing Meds: Normal Oral Care: Normal Shopping: Normal Toileting: Normal Transferring: Normal Walking: Normal Cognitive Status reviewed on 01/31/2016 ------------------------------------------------ Attention: Normal Decision Making: Normal Language: Normal Memory: Normal Motor: Normal Perception: Normal Problem Solving: Normal Visual and Spatial: Normal   Family History:Reviewed  Family Health History Mother, Deceased; Ovarian cancer;  Father, Deceased; Urinary bladder cancer;      Objective Information: General:Well appearing, well nourished in no distress. Skin:no rash or prominent lesions Head:Atraumatic; no masses; no abnormalities Neck:Supple without lymphadenopathy.  Heart:RRR, no murmur or gallop.  Normal S1, S2.  No S3, S4.  Lungs:CTA bilaterally, no wheezes, rhonchi, rales.  Breathing unlabored. Abdomen:Soft, NT/ND, no HSM, no masses. deferred to procedure  Assessment:Personal h/o colon polyps  Diagnoses: V12.72  Z86.010 History of polyp of colon (Personal history of colonic polyps)  Procedures: [id number] - OFFICE OUTPATIENT NEW 30 MINUTES    Plan:  Scheduled for TCS on 02/21/2016.  Trilyte prescribed.   Patient Education:Alternative treatments to surgery were discussed with patient (and family).Risks and benefits  of procedure including bleeding and perforation were fully explained to the patient (and family) who gave informed consent. Patient/family questions were addressed.  Follow-up:Pending Surgery

## 2016-02-04 NOTE — Assessment & Plan Note (Signed)
No medication management Updated lab needed at/ before next visit.

## 2016-02-04 NOTE — Progress Notes (Signed)
Taylor Mitchell     MRN: [id number]      DOB: 01-02-1941   HPI [name] is here for follow up and re-evaluation of chronic medical conditions, medication management and review of any available recent lab and radiology data.  Preventive health is updated, specifically  Cancer screening and Immunization.   Questions or concerns regarding consultations or procedures which the PT has had in the interim are  Addressed.Foot pain is improving following injection, has another one scheduled The PT denies any adverse reactions to current medications since the last visit.  There are no new concerns.  There are no specific complaints   ROS Denies recent fever or chills. Denies sinus pressure, nasal congestion, ear pain or sore throat. Denies chest congestion, productive cough or wheezing. Denies chest pains, palpitations and leg swelling Denies abdominal pain, nausea, vomiting,diarrhea or constipation.   Denies dysuria, frequency, hesitancy or incontinence. . Denies headaches, seizures, numbness, or tingling. Denies depression, uncontrolled anxiety or insomnia. Denies skin break down or rash.   PE  BP 112/72 mmHg  Pulse 65  Resp 16  Ht 5\' 6"  (1.676 m)  Wt 191 lb (86.637 kg)  BMI 30.84 kg/m2  SpO2 97%  Patient alert and oriented and in no cardiopulmonary distress.  HEENT: No facial asymmetry, EOMI,   oropharynx pink and moist.  Neck supple no JVD, no mass.  Chest: Clear to auscultation bilaterally.  CVS: S1, S2 no murmurs, no S3.Regular rate.  ABD: Soft non tender.   Ext: No edema  MS: Adequate ROM spine, shoulders, hips and knees.  Skin: Intact, no ulcerations or rash noted.  Psych: Good eye contact, normal affect. Memory intact not anxious or depressed appearing.  CNS: CN 2-12 intact, power,  normal throughout.no focal deficits noted.   Assessment & Plan   Hyperlipidemia Improved, dietary management only Hyperlipidemia:Low fat diet discussed and  encouraged.   Lipid Panel  Lab Results  Component Value Date   CHOL 280* 01/26/2016   HDL 54 01/26/2016   LDLCALC 197* 01/26/2016   TRIG 144 01/26/2016   CHOLHDL 5.2* 01/26/2016      Updated lab needed at/ before next visit.   Abnormal TSH No medication management Updated lab needed at/ before next visit.   Overweight (BMI 25.0-29.9) . Patient re-educated about  the importance of commitment to a  minimum of 150 minutes of exercise per week.  The importance of healthy food choices with portion control discussed. Encouraged to start a food diary, count calories and to consider  joining a support group. Sample diet sheets offered. Goals set by the patient for the next several months.   Weight /BMI 01/31/2016 12/29/2015 09/19/2015  WEIGHT 191 lb 187 lb 185 lb  HEIGHT 5\' 6"  5\' 6"  5' 6.25"  BMI 30.84 kg/m2 30.2 kg/m2 29.63 kg/m2    Current exercise per week 60 minutes.Limited currently by foot pain   Allergic rhinitis No current flare , controlled on current med  GAD (generalized anxiety disorder) Controlled, no change in management   Prediabetes Deteriorated, but has recently been on high dose steroids Patient educated about the importance of limiting  Carbohydrate intake , the need to commit to daily physical activity for a minimum of 30 minutes , and to commit weight loss. The fact that changes in all these areas will reduce or eliminate all together the development of diabetes is stressed.   Diabetic Labs Latest Ref Rng 01/26/2016 09/13/2015 06/07/2015 12/17/2014 09/20/2014  HbA1c <5.7 % - 6.4(H) 6.4(H)  6.2(H) 6.1(H)  Chol 125 - 200 mg/dL 280(H) 314(H) 281(H) 230(H) 274(H)  HDL >=46 mg/dL 54 54 44(L) 45(L) 52  Calc LDL <130 mg/dL 197(H) 233(H) 209(H) 154(H) 198(H)  Triglycerides <150 mg/dL 144 136 142 154(H) 119  Creatinine 0.60 - 0.93 mg/dL 0.63 - - 0.61 -   BP/Weight 01/31/2016 12/29/2015 09/19/2015 06/09/2015 01/03/2015 Q000111Q [id number]  Systolic BP XX123456 [id number] A999333 123456  XX123456 123456 [id number]  Diastolic BP 72 78 76 80 70 78 84  Wt. (Lbs) 191 187 185 180.12 - 178 173.6  BMI 30.84 30.2 29.63 28.84 - 27.87 27.18   No flowsheet data found.     Pain of right heel Improving, being treated by podiatry with injections  Metabolic syndrome X The increased risk of cardiovascular disease associated with this diagnosis, and the need to consistently work on lifestyle to change this is discussed. Following  a  heart healthy diet ,commitment to 30 minutes of exercise at least 5 days per week, as well as control of blood sugar and cholesterol , and achieving a healthy weight are all the areas to be addressed .

## 2016-02-04 NOTE — Assessment & Plan Note (Signed)
.   Patient re-educated about  the importance of commitment to a  minimum of 150 minutes of exercise per week.  The importance of healthy food choices with portion control discussed. Encouraged to start a food diary, count calories and to consider  joining a support group. Sample diet sheets offered. Goals set by the patient for the next several months.   Weight /BMI 01/31/2016 12/29/2015 09/19/2015  WEIGHT 191 lb 187 lb 185 lb  HEIGHT 5\' 6"  5\' 6"  5' 6.25"  BMI 30.84 kg/m2 30.2 kg/m2 29.63 kg/m2    Current exercise per week 60 minutes.Limited currently by foot pain

## 2016-02-04 NOTE — Assessment & Plan Note (Signed)
Controlled , no change in management 

## 2016-02-04 NOTE — Assessment & Plan Note (Signed)
Improved, dietary management only Hyperlipidemia:Low fat diet discussed and encouraged.   Lipid Panel  Lab Results  Component Value Date   CHOL 280* 01/26/2016   HDL 54 01/26/2016   LDLCALC 197* 01/26/2016   TRIG 144 01/26/2016   CHOLHDL 5.2* 01/26/2016      Updated lab needed at/ before next visit.

## 2016-02-04 NOTE — Assessment & Plan Note (Signed)
Deteriorated, but has recently been on high dose steroids Patient educated about the importance of limiting  Carbohydrate intake , the need to commit to daily physical activity for a minimum of 30 minutes , and to commit weight loss. The fact that changes in all these areas will reduce or eliminate all together the development of diabetes is stressed.   Diabetic Labs Latest Ref Rng 01/26/2016 09/13/2015 06/07/2015 12/17/2014 09/20/2014  HbA1c <5.7 % - 6.4(H) 6.4(H) 6.2(H) 6.1(H)  Chol 125 - 200 mg/dL 280(H) 314(H) 281(H) 230(H) 274(H)  HDL >=46 mg/dL 54 54 44(L) 45(L) 52  Calc LDL <130 mg/dL 197(H) 233(H) 209(H) 154(H) 198(H)  Triglycerides <150 mg/dL 144 136 142 154(H) 119  Creatinine 0.60 - 0.93 mg/dL 0.63 - - 0.61 -   BP/Weight 01/31/2016 12/29/2015 09/19/2015 06/09/2015 01/03/2015 Q000111Q [id number]  Systolic BP XX123456 123XX123 A999333 123456 XX123456 123456 [id number]  Diastolic BP 72 78 76 80 70 78 84  Wt. (Lbs) 191 187 185 180.12 - 178 173.6  BMI 30.84 30.2 29.63 28.84 - 27.87 27.18   No flowsheet data found.

## 2016-02-04 NOTE — Assessment & Plan Note (Signed)
No current flare , controlled on current med

## 2016-02-04 NOTE — Assessment & Plan Note (Signed)
The increased risk of cardiovascular disease associated with this diagnosis, and the need to consistently work on lifestyle to change this is discussed. Following  a  heart healthy diet ,commitment to 30 minutes of exercise at least 5 days per week, as well as control of blood sugar and cholesterol , and achieving a healthy weight are all the areas to be addressed .  

## 2016-02-04 NOTE — Assessment & Plan Note (Signed)
Improving, being treated by podiatry with injections

## 2016-02-21 ENCOUNTER — Encounter (HOSPITAL_COMMUNITY)
Admission: RE | Disposition: A | Discharge status: Home / Self Care | Payer: Self-pay | Source: Ambulatory Visit | Attending: General Surgery

## 2016-02-21 ENCOUNTER — Encounter (HOSPITAL_COMMUNITY): Payer: Self-pay | Admitting: *Deleted

## 2016-02-21 ENCOUNTER — Ambulatory Visit (HOSPITAL_COMMUNITY)
Admission: RE | Admit: 2016-02-21 | Discharge: 2016-02-21 | Disposition: A | Discharge status: Home / Self Care | Payer: Medicare Other | Source: Ambulatory Visit | Attending: General Surgery | Admitting: General Surgery

## 2016-02-21 DIAGNOSIS — Z8052 Family history of malignant neoplasm of bladder: Secondary | ICD-10-CM | POA: Diagnosis not present

## 2016-02-21 DIAGNOSIS — K5730 Diverticulosis of large intestine without perforation or abscess without bleeding: Secondary | ICD-10-CM | POA: Insufficient documentation

## 2016-02-21 DIAGNOSIS — Z79899 Other long term (current) drug therapy: Secondary | ICD-10-CM | POA: Diagnosis not present

## 2016-02-21 DIAGNOSIS — Z8041 Family history of malignant neoplasm of ovary: Secondary | ICD-10-CM | POA: Diagnosis not present

## 2016-02-21 DIAGNOSIS — Z86010 Personal history of colonic polyps: Secondary | ICD-10-CM | POA: Diagnosis not present

## 2016-02-21 DIAGNOSIS — F419 Anxiety disorder, unspecified: Secondary | ICD-10-CM | POA: Insufficient documentation

## 2016-02-21 DIAGNOSIS — Z87891 Personal history of nicotine dependence: Secondary | ICD-10-CM | POA: Diagnosis not present

## 2016-02-21 HISTORY — PX: COLONOSCOPY: SHX5424

## 2016-02-21 SURGERY — COLONOSCOPY
Anesthesia: Moderate Sedation

## 2016-02-21 MED ORDER — MIDAZOLAM HCL 5 MG/5ML IJ SOLN
INTRAMUSCULAR | Status: DC | PRN
  Administered 2016-02-21: 3 mg via INTRAVENOUS

## 2016-02-21 MED ORDER — MEPERIDINE HCL 50 MG/ML IJ SOLN
INTRAMUSCULAR | Status: DC | PRN
  Administered 2016-02-21: 50 mg via INTRAVENOUS

## 2016-02-21 MED ORDER — MEPERIDINE HCL 50 MG/ML IJ SOLN
INTRAMUSCULAR | Status: AC
  Filled 2016-02-21: qty 1

## 2016-02-21 MED ORDER — MIDAZOLAM HCL 5 MG/5ML IJ SOLN
INTRAMUSCULAR | Status: AC
  Filled 2016-02-21: qty 10

## 2016-02-21 MED ORDER — STERILE WATER FOR IRRIGATION IR SOLN
Status: DC | PRN
  Administered 2016-02-21: 08:00:00

## 2016-02-21 MED ORDER — SODIUM CHLORIDE 0.9 % IV SOLN
INTRAVENOUS | Status: DC
  Administered 2016-02-21: 07:00:00 via INTRAVENOUS

## 2016-02-21 NOTE — Interval H&P Note (Signed)
History and Physical Interval Note:  02/21/2016 7:20 AM  Taylor Mitchell  has presented today for surgery, with the diagnosis of personal hx of colon polyps  The various methods of treatment have been discussed with the patient and family. After consideration of risks, benefits and other options for treatment, the patient has consented to  Procedure(s): COLONOSCOPY (N/A) as a surgical intervention .  The patient's history has been reviewed, patient examined, no change in status, stable for surgery.  I have reviewed the patient's chart and labs.  Questions were answered to the patient's satisfaction.     Aviva Signs A

## 2016-02-21 NOTE — Discharge Instructions (Signed)
Diverticulosis °Diverticulosis is the condition that develops when small pouches (diverticula) form in the wall of your colon. Your colon, or large intestine, is where water is absorbed and stool is formed. The pouches form when the inside layer of your colon pushes through weak spots in the outer layers of your colon. °CAUSES  °No one knows exactly what causes diverticulosis. °RISK FACTORS °· Being older than 50. Your risk for this condition increases with age. Diverticulosis is rare in people younger than 40 years. By age 80, almost everyone has it. °· Eating a low-fiber diet. °· Being frequently constipated. °· Being overweight. °· Not getting enough exercise. °· Smoking. °· Taking over-the-counter pain medicines, like aspirin and ibuprofen. °SYMPTOMS  °Most people with diverticulosis do not have symptoms. °DIAGNOSIS  °Because diverticulosis often has no symptoms, health care providers often discover the condition during an exam for other colon problems. In many cases, a health care provider will diagnose diverticulosis while using a flexible scope to examine the colon (colonoscopy). °TREATMENT  °If you have never developed an infection related to diverticulosis, you may not need treatment. If you have had an infection before, treatment may include: °· Eating more fruits, vegetables, and grains. °· Taking a fiber supplement. °· Taking a live bacteria supplement (probiotic). °· Taking medicine to relax your colon. °HOME CARE INSTRUCTIONS  °· Drink at least 6-8 glasses of water each day to prevent constipation. °· Try not to strain when you have a bowel movement. °· Keep all follow-up appointments. °If you have had an infection before:  °· Increase the fiber in your diet as directed by your health care provider or dietitian. °· Take a dietary fiber supplement if your health care provider approves. °· Only take medicines as directed by your health care provider. °SEEK MEDICAL CARE IF:  °· You have abdominal  pain. °· You have bloating. °· You have cramps. °· You have not gone to the bathroom in 3 days. °SEEK IMMEDIATE MEDICAL CARE IF:  °· Your pain gets worse. °· Your bloating becomes very bad. °· You have a fever or chills, and your symptoms suddenly get worse. °· You begin vomiting. °· You have bowel movements that are bloody or black. °MAKE SURE YOU: °· Understand these instructions. °· Will watch your condition. °· Will get help right away if you are not doing well or get worse. °  °This information is not intended to replace advice given to you by your health care provider. Make sure you discuss any questions you have with your health care provider. °  °Document Released: 04/26/2004 Document Revised: 08/04/2013 Document Reviewed: 06/24/2013 °Elsevier Interactive Patient Education ©2016 [employer] °Colonoscopy, Care After °Refer to this sheet in the next few weeks. These instructions provide you with information on caring for yourself after your procedure. Your health care provider may also give you more specific instructions. Your treatment has been planned according to current medical practices, but problems sometimes occur. Call your health care provider if you have any problems or questions after your procedure. °WHAT TO EXPECT AFTER THE PROCEDURE  °After your procedure, it is typical to have the following: °· A small amount of blood in your stool. °· Moderate amounts of gas and mild abdominal cramping or bloating. °HOME CARE INSTRUCTIONS °· Do not drive, operate machinery, or sign important documents for 24 hours. °· You may shower and resume your regular physical activities, but move at a slower pace for the first 24 hours. °· Take frequent rest periods for the   first 24 hours. °· Walk around or put a warm pack on your abdomen to help reduce abdominal cramping and bloating. °· Drink enough fluids to keep your urine clear or pale yellow. °· You may resume your normal diet as instructed by your health care  provider. Avoid heavy or fried foods that are hard to digest. °· Avoid drinking alcohol for 24 hours or as instructed by your health care provider. °· Only take over-the-counter or prescription medicines as directed by your health care provider. °· If a tissue sample (biopsy) was taken during your procedure: °· Do not take aspirin or blood thinners for 7 days, or as instructed by your health care provider. °· Do not drink alcohol for 7 days, or as instructed by your health care provider. °· Eat soft foods for the first 24 hours. °SEEK MEDICAL CARE IF: °You have persistent spotting of blood in your stool 2-3 days after the procedure. °SEEK IMMEDIATE MEDICAL CARE IF: °· You have more than a small spotting of blood in your stool. °· You pass large blood clots in your stool. °· Your abdomen is swollen (distended). °· You have nausea or vomiting. °· You have a fever. °· You have increasing abdominal pain that is not relieved with medicine. °  °This information is not intended to replace advice given to you by your health care provider. Make sure you discuss any questions you have with your health care provider. °  °Document Released: 03/13/2004 Document Revised: 05/20/2013 Document Reviewed: 04/06/2013 °[employer]vier Interactive Patient Education ©2016 [employer] ° °

## 2016-02-21 NOTE — Op Note (Signed)
[facility] Patient Name: Taylor Mitchell Procedure Date: 02/21/2016 7:27 AM MRN: [id number] Date of Birth: 1941-02-07 Attending MD: Aviva Signs , MD CSN: [id number] Age: 75 Admit Type: Outpatient Procedure:                Colonoscopy Indications:              Last colonoscopy 5 years ago Providers:                Aviva Signs, MD, Tammy Vaught, RN, Purcell Nails.                            Lemons, Technician Referring MD:              Medicines:                Midazolam 3 mg IV, Meperidine 50 mg IV Complications:            No immediate complications. Estimated blood loss:                            None. Estimated Blood Loss:     Estimated blood loss: none. Procedure:                Pre-Anesthesia Assessment:                           - Prior to the procedure, a History and Physical                            was performed, and patient medications and                            allergies were reviewed. The patient is competent.                            The risks and benefits of the procedure and the                            sedation options and risks were discussed with the                            patient. All questions were answered and informed                            consent was obtained. Patient identification and                            proposed procedure were verified by the nurse in                            the endoscopy suite. Mental Status Examination:                            normal. Airway Examination: normal oropharyngeal  airway and neck mobility. Respiratory Examination:                            clear to auscultation. CV Examination: normal.                            Prophylactic Antibiotics: The patient does not                            require prophylactic antibiotics. Prior                            Anticoagulants: The patient has taken no previous                            anticoagulant or antiplatelet agents. ASA  Grade                            Assessment: II - A patient with mild systemic                            disease. After reviewing the risks and benefits,                            the patient was deemed in satisfactory condition to                            undergo the procedure. The anesthesia plan was to                            use moderate sedation / analgesia (conscious                            sedation). Immediately prior to administration of                            medications, the patient was re-assessed for                            adequacy to receive sedatives. The heart rate,                            respiratory rate, oxygen saturations, blood                            pressure, adequacy of pulmonary ventilation, and                            response to care were monitored throughout the                            procedure. The physical status of the patient was  re-assessed after the procedure.                           After obtaining informed consent, the colonoscope                            was passed under direct vision. Throughout the                            procedure, the patient's blood pressure, pulse, and                            oxygen saturations were monitored continuously. The                            EC-[id number]) scope was introduced through                            the anus and advanced to the the cecum, identified                            by the appendiceal orifice, ileocecal valve and                            palpation. The colonoscopy was performed without                            difficulty. The entire colon was well visualized.                            No anatomical landmarks were photographed. The                            quality of the bowel preparation was adequate. Scope In: 7:30:52 AM Scope Out: 7:40:51 AM Scope Withdrawal Time: 0 hours 5 minutes 36 seconds  Total Procedure  Duration: 0 hours 9 minutes 59 seconds  Findings:      Medium-mouthed diverticula were found in the descending colon.      The exam was otherwise without abnormality on direct and retroflexion       views. Impression:               - Moderate diverticulosis in the descending colon.                           - The examination was otherwise normal on direct                            and retroflexion views.                           - No specimens collected. Moderate Sedation:      Moderate (conscious) sedation was administered by the endoscopy nurse       and supervised by the endoscopist. The following parameters were       monitored: oxygen saturation, heart rate, blood pressure, and  response       to care. Total physician intraservice time was 22 minutes. Recommendation:           - Patient has a contact number available for                            emergencies. The signs and symptoms of potential                            delayed complications were discussed with the                            patient. Return to normal activities tomorrow.                            Written discharge instructions were provided to the                            patient.                           - Written discharge instructions were provided to                            the patient.                           - Resume previous diet.                           - Continue present medications.                           - No repeat colonoscopy due to age. Procedure Code(s):        --- Professional ---                           [phone], Colonoscopy, flexible; diagnostic, including                            collection of specimen(s) by brushing or washing,                            when performed (separate procedure)                           [id number], Moderate sedation services provided by the                            same physician or other qualified health care                            professional  performing the diagnostic or                            therapeutic service that the sedation supports,  requiring the presence of an independent trained                            observer to assist in the monitoring of the                            patient's level of consciousness and physiological                            status; initial 15 minutes of intraservice time,                            patient age 43 years or older Diagnosis Code(s):        --- Professional ---                           K57.30, Diverticulosis of large intestine without                            perforation or abscess without bleeding CPT copyright 2016 American [employer] Association. All rights reserved. The codes documented in this report are preliminary and upon coder review may  be revised to meet current compliance requirements. Aviva Signs, MD Aviva Signs, MD 02/21/2016 7:48:08 AM This report has been signed electronically. Number of Addenda: 0

## 2016-02-23 ENCOUNTER — Encounter (HOSPITAL_COMMUNITY): Payer: Self-pay | Admitting: General Surgery

## 2016-03-06 DIAGNOSIS — M545 Low back pain: Secondary | ICD-10-CM | POA: Diagnosis not present

## 2016-03-06 DIAGNOSIS — M9905 Segmental and somatic dysfunction of pelvic region: Secondary | ICD-10-CM | POA: Diagnosis not present

## 2016-03-06 DIAGNOSIS — M9903 Segmental and somatic dysfunction of lumbar region: Secondary | ICD-10-CM | POA: Diagnosis not present

## 2016-03-06 DIAGNOSIS — M9902 Segmental and somatic dysfunction of thoracic region: Secondary | ICD-10-CM | POA: Diagnosis not present

## 2016-03-06 DIAGNOSIS — M546 Pain in thoracic spine: Secondary | ICD-10-CM | POA: Diagnosis not present

## 2016-05-02 DIAGNOSIS — L218 Other seborrheic dermatitis: Secondary | ICD-10-CM | POA: Diagnosis not present

## 2016-05-02 DIAGNOSIS — L708 Other acne: Secondary | ICD-10-CM | POA: Diagnosis not present

## 2016-05-02 DIAGNOSIS — L820 Inflamed seborrheic keratosis: Secondary | ICD-10-CM | POA: Diagnosis not present

## 2016-05-15 DIAGNOSIS — H5203 Hypermetropia, bilateral: Secondary | ICD-10-CM | POA: Diagnosis not present

## 2016-05-15 DIAGNOSIS — H524 Presbyopia: Secondary | ICD-10-CM | POA: Diagnosis not present

## 2016-05-15 DIAGNOSIS — H25013 Cortical age-related cataract, bilateral: Secondary | ICD-10-CM | POA: Diagnosis not present

## 2016-05-15 DIAGNOSIS — H2513 Age-related nuclear cataract, bilateral: Secondary | ICD-10-CM | POA: Diagnosis not present

## 2016-05-15 DIAGNOSIS — H52203 Unspecified astigmatism, bilateral: Secondary | ICD-10-CM | POA: Diagnosis not present

## 2016-06-14 DIAGNOSIS — R7303 Prediabetes: Secondary | ICD-10-CM | POA: Diagnosis not present

## 2016-06-14 DIAGNOSIS — R799 Abnormal finding of blood chemistry, unspecified: Secondary | ICD-10-CM | POA: Diagnosis not present

## 2016-06-14 DIAGNOSIS — E559 Vitamin D deficiency, unspecified: Secondary | ICD-10-CM | POA: Diagnosis not present

## 2016-06-14 DIAGNOSIS — R7309 Other abnormal glucose: Secondary | ICD-10-CM | POA: Diagnosis not present

## 2016-06-14 DIAGNOSIS — E785 Hyperlipidemia, unspecified: Secondary | ICD-10-CM | POA: Diagnosis not present

## 2016-06-14 LAB — CBC
HEMATOCRIT: 42.8 % (ref 35.0–45.0)
Hemoglobin: 14 g/dL (ref 11.7–15.5)
MCH: 30.7 pg (ref 27.0–33.0)
MCHC: 32.7 g/dL (ref 32.0–36.0)
MCV: 93.9 fL (ref 80.0–100.0)
MPV: 10.6 fL (ref 7.5–12.5)
PLATELETS: 233 10*3/uL (ref 140–400)
RBC: 4.56 MIL/uL (ref 3.80–5.10)
RDW: 13.4 % (ref 11.0–15.0)
WBC: 5.6 10*3/uL (ref 3.8–10.8)

## 2016-06-14 LAB — LIPID PANEL
CHOL/HDL RATIO: 6.3 ratio — AB (ref <=5.0)
CHOLESTEROL: 292 mg/dL — AB (ref 125–200)
HDL: 46 mg/dL (ref >=46)
LDL Cholesterol: 219 mg/dL — ABNORMAL HIGH (ref <130)
TRIGLYCERIDES: 133 mg/dL (ref <150)
VLDL: 27 mg/dL (ref <30)

## 2016-06-14 LAB — COMPLETE METABOLIC PANEL WITH GFR
ALBUMIN: 4.1 g/dL (ref 3.6–5.1)
ALT: 17 U/L (ref 6–29)
AST: 14 U/L (ref 10–35)
Alkaline Phosphatase: 54 U/L (ref 33–130)
BILIRUBIN TOTAL: 0.7 mg/dL (ref 0.2–1.2)
BUN: 17 mg/dL (ref 7–25)
CALCIUM: 9.5 mg/dL (ref 8.6–10.4)
CHLORIDE: 106 mmol/L (ref 98–110)
CO2: 25 mmol/L (ref 20–31)
CREATININE: 0.69 mg/dL (ref 0.60–0.93)
GFR, Est African American: >89 mL/min (ref >=60)
GFR, Est Non African American: 87 mL/min (ref >=60)
Glucose, Bld: 142 mg/dL — ABNORMAL HIGH (ref 65–99)
Potassium: 4 mmol/L (ref 3.5–5.3)
Sodium: 141 mmol/L (ref 135–146)
TOTAL PROTEIN: 6.7 g/dL (ref 6.1–8.1)

## 2016-06-14 LAB — HEMOGLOBIN A1C
HEMOGLOBIN A1C: 6.2 % — AB (ref <5.7)
Mean Plasma Glucose: 131 mg/dL

## 2016-06-14 LAB — TSH: TSH: 3.3 mIU/L

## 2016-06-15 LAB — VITAMIN D 25 HYDROXY (VIT D DEFICIENCY, FRACTURES): Vit D, 25-Hydroxy: 25 ng/mL — ABNORMAL LOW (ref 30–100)

## 2016-06-19 ENCOUNTER — Encounter: Payer: Self-pay | Admitting: Family Medicine

## 2016-06-19 ENCOUNTER — Ambulatory Visit (INDEPENDENT_AMBULATORY_CARE_PROVIDER_SITE_OTHER): Payer: Medicare Other | Admitting: Family Medicine

## 2016-06-19 VITALS — BP 114/74 | HR 60 | Ht 66.0 in | Wt 188.0 lb

## 2016-06-19 DIAGNOSIS — Z23 Encounter for immunization: Secondary | ICD-10-CM

## 2016-06-19 DIAGNOSIS — Z0000 Encounter for general adult medical examination without abnormal findings: Secondary | ICD-10-CM

## 2016-06-19 NOTE — Patient Instructions (Addendum)
Wellness end February, call if you need me before  mD follow up last week in April, fasting lipid, chem 7 , hBA1C mid April  Please try to modify eating/ diet so that your cholesterol improves  Blood sugar is better , so GREAT!  Hopefully right foot will continue to improve so that you can start walking a bit more, in the interim, due exercises like stretches, touching toes, muscle strengthening, these all help  Flu vaccine today  Thank you  for choosing  [facility]. We consider it a privelige to serve you.  Delivering excellent health care in a caring and  compassionate way is our goal.  Partnering with you,  so that together we can achieve this goal is our strategy.

## 2016-06-24 ENCOUNTER — Encounter: Payer: Self-pay | Admitting: Family Medicine

## 2016-06-24 NOTE — Assessment & Plan Note (Signed)

## 2016-06-24 NOTE — Assessment & Plan Note (Signed)
After obtaining informed consent, the vaccine is  administered by LPN.  

## 2016-06-24 NOTE — Progress Notes (Signed)
    Taylor Mitchell     MRN: [id number]      DOB: 1940/12/31  HPI: Patient is in for annual physical exam. No other health concerns are expressed or addressed at the visit. Recent labs, if available are reviewed. Immunization is reviewed , and  updated if needed.   PE:  BP 114/74   Pulse 60   Ht 5\' 6"  (1.676 m)   Wt 188 lb (85.3 kg)   SpO2 96%   BMI 30.34 kg/m   Pleasant  female, alert and oriented x 3, in no cardio-pulmonary distress. Afebrile. HEENT No facial trauma or asymetry. Sinuses non tender.  Extra occullar muscles intact, pupils equally reactive to light. External ears normal, tympanic membranes clear. Oropharynx moist, no exudate. Neck: supple, no adenopathy,JVD or thyromegaly.No bruits.  Chest: Clear to ascultation bilaterally.No crackles or wheezes. Non tender to palpation  Breast: No asymetry,no masses or lumps. No tenderness. No nipple discharge or inversion. No axillary or supraclavicular adenopathy  Cardiovascular system; Heart sounds normal,  S1 and  S2 ,no S3.  No murmur, or thrill. Apical beat not displaced Peripheral pulses normal.  Abdomen: Soft, non tender, no organomegaly or masses. No bruits. Bowel sounds normal. No guarding, tenderness or rebound.  Rectal:  Normal sphincter tone. No rectal mass. Guaiac negative stool.  GU: External genitalia normal female genitalia , normal female distribution of hair. No lesions. Urethral meatus normal in size, no  Prolapse, no lesions visibly  Present. Bladder non tender. Vagina pink and moist , with no visible lesions , discharge present . Adequate pelvic support no  cystocele or rectocele noted Cervix pink and appears healthy, no lesions or ulcerations noted, no discharge noted from os Uterus normal size, no adnexal masses, no cervical motion or adnexal tenderness.   Musculoskeletal exam: Full ROM of spine, hips , shoulders and knees. No deformity ,swelling or crepitus noted. No muscle  wasting or atrophy.   Neurologic: Cranial nerves 2 to 12 intact. Power, tone ,sensation and reflexes normal throughout. No disturbance in gait. No tremor.  Skin: Intact, no ulceration, erythema , scaling or rash noted. Pigmentation normal throughout  Psych; Normal mood and affect. Judgement and concentration normal   Assessment & Plan:  Annual physical exam Annual exam as documented. Counseling done  re healthy lifestyle involving commitment to 150 minutes exercise per week, heart healthy diet, and attaining healthy weight.The importance of adequate sleep also discussed. Regular seat belt use and home safety, is also discussed. Changes in health habits are decided on by the patient with goals and time frames  set for achieving them. Immunization and cancer screening needs are specifically addressed at this visit.   Need for prophylactic vaccination and inoculation against influenza After obtaining informed consent, the vaccine is  administered by LPN.

## 2016-06-28 DIAGNOSIS — M9903 Segmental and somatic dysfunction of lumbar region: Secondary | ICD-10-CM | POA: Diagnosis not present

## 2016-06-28 DIAGNOSIS — M545 Low back pain: Secondary | ICD-10-CM | POA: Diagnosis not present

## 2016-06-28 DIAGNOSIS — M546 Pain in thoracic spine: Secondary | ICD-10-CM | POA: Diagnosis not present

## 2016-06-28 DIAGNOSIS — M9905 Segmental and somatic dysfunction of pelvic region: Secondary | ICD-10-CM | POA: Diagnosis not present

## 2016-06-28 DIAGNOSIS — M9902 Segmental and somatic dysfunction of thoracic region: Secondary | ICD-10-CM | POA: Diagnosis not present

## 2016-08-08 DIAGNOSIS — M9902 Segmental and somatic dysfunction of thoracic region: Secondary | ICD-10-CM | POA: Diagnosis not present

## 2016-08-08 DIAGNOSIS — M9903 Segmental and somatic dysfunction of lumbar region: Secondary | ICD-10-CM | POA: Diagnosis not present

## 2016-08-08 DIAGNOSIS — M545 Low back pain: Secondary | ICD-10-CM | POA: Diagnosis not present

## 2016-08-08 DIAGNOSIS — M546 Pain in thoracic spine: Secondary | ICD-10-CM | POA: Diagnosis not present

## 2016-08-08 DIAGNOSIS — M9905 Segmental and somatic dysfunction of pelvic region: Secondary | ICD-10-CM | POA: Diagnosis not present

## 2016-09-04 DIAGNOSIS — M9905 Segmental and somatic dysfunction of pelvic region: Secondary | ICD-10-CM | POA: Diagnosis not present

## 2016-09-04 DIAGNOSIS — M9903 Segmental and somatic dysfunction of lumbar region: Secondary | ICD-10-CM | POA: Diagnosis not present

## 2016-09-04 DIAGNOSIS — M9902 Segmental and somatic dysfunction of thoracic region: Secondary | ICD-10-CM | POA: Diagnosis not present

## 2016-09-04 DIAGNOSIS — M545 Low back pain: Secondary | ICD-10-CM | POA: Diagnosis not present

## 2016-09-04 DIAGNOSIS — M546 Pain in thoracic spine: Secondary | ICD-10-CM | POA: Diagnosis not present

## 2016-10-09 ENCOUNTER — Ambulatory Visit: Payer: Self-pay

## 2016-10-26 DIAGNOSIS — M9905 Segmental and somatic dysfunction of pelvic region: Secondary | ICD-10-CM | POA: Diagnosis not present

## 2016-10-26 DIAGNOSIS — M9903 Segmental and somatic dysfunction of lumbar region: Secondary | ICD-10-CM | POA: Diagnosis not present

## 2016-10-26 DIAGNOSIS — M9902 Segmental and somatic dysfunction of thoracic region: Secondary | ICD-10-CM | POA: Diagnosis not present

## 2016-10-26 DIAGNOSIS — M545 Low back pain: Secondary | ICD-10-CM | POA: Diagnosis not present

## 2016-10-26 DIAGNOSIS — M546 Pain in thoracic spine: Secondary | ICD-10-CM | POA: Diagnosis not present

## 2016-11-15 ENCOUNTER — Ambulatory Visit (INDEPENDENT_AMBULATORY_CARE_PROVIDER_SITE_OTHER): Payer: Medicare Other

## 2016-11-15 VITALS — BP 120/70 | HR 59 | Temp 98.0°F | Ht 66.0 in | Wt 188.1 lb

## 2016-11-15 DIAGNOSIS — Z0000 Encounter for general adult medical examination without abnormal findings: Secondary | ICD-10-CM | POA: Diagnosis not present

## 2016-11-15 NOTE — Patient Instructions (Addendum)
Taylor Mitchell , Thank you for taking time to come for your Medicare Wellness Visit. I appreciate your ongoing commitment to your health goals. Please review the following plan we discussed and let me know if I can assist you in the future.   Screening recommendations/referrals: Colonoscopy: Up to date, next due 02/2026 Mammogram: Overdue, was due 10/2016 Bone Density: Up to date Recommended yearly ophthalmology/optometry visit for glaucoma screening and checkup Recommended yearly dental visit for hygiene and checkup  Vaccinations: Influenza vaccine: Up to date, next due 03/2017  Pneumococcal vaccine: Up to date Tdap vaccine: Up to date, next due 10/2018 Shingles vaccine: Due now, declines    Advanced directives: Advance directive discussed with you today. Even though you declined this today, please call our office at any time if you change your mind and we can provide you with the paperwork to fill out.  Conditions/risks identified: Obese, recommend starting a routine exercise program at least 3 days a week for 30-45 minutes at a time as tolerated.   Next appointment: Follow up in 2 months with Dr. Moshe Cipro. Follow up in 1 year for your annual wellness visit.  Preventive Care 1 Years and Older, Female Preventive care refers to lifestyle choices and visits with your health care provider that can promote health and wellness. What does preventive care include?  A yearly physical exam. This is also called an annual well check.  Dental exams once or twice a year.  Routine eye exams. Ask your health care provider how often you should have your eyes checked.  Personal lifestyle choices, including:  Daily care of your teeth and gums.  Regular physical activity.  Eating a healthy diet.  Avoiding tobacco and drug use.  Limiting alcohol use.  Practicing safe sex.  Taking low-dose aspirin every day.  Taking vitamin and mineral supplements as recommended by your health care provider. What  happens during an annual well check? The services and screenings done by your health care provider during your annual well check will depend on your age, overall health, lifestyle risk factors, and family history of disease. Counseling  Your health care provider may ask you questions about your:  Alcohol use.  Tobacco use.  Drug use.  Emotional well-being.  Home and relationship well-being.  Sexual activity.  Eating habits.  History of falls.  Memory and ability to understand (cognition).  Work and work Statistician.  Reproductive health. Screening  You may have the following tests or measurements:  Height, weight, and BMI.  Blood pressure.  Lipid and cholesterol levels. These may be checked every 5 years, or more frequently if you are over 60 years old.  Skin check.  Lung cancer screening. You may have this screening every year starting at age 64 if you have a 30-pack-year history of smoking and currently smoke or have quit within the past 15 years.  Fecal occult blood test (FOBT) of the stool. You may have this test every year starting at age 36.  Flexible sigmoidoscopy or colonoscopy. You may have a sigmoidoscopy every 5 years or a colonoscopy every 10 years starting at age 73.  Hepatitis C blood test.  Hepatitis B blood test.  Sexually transmitted disease (STD) testing.  Diabetes screening. This is done by checking your blood sugar (glucose) after you have not eaten for a while (fasting). You may have this done every 1-3 years.  Bone density scan. This is done to screen for osteoporosis. You may have this done starting at age 51.  Mammogram.  This may be done every 1-2 years. Talk to your health care provider about how often you should have regular mammograms. Talk with your health care provider about your test results, treatment options, and if necessary, the need for more tests. Vaccines  Your health care provider may recommend certain vaccines, such  as:  Influenza vaccine. This is recommended every year.  Tetanus, diphtheria, and acellular pertussis (Tdap, Td) vaccine. You may need a Td booster every 10 years.  Zoster vaccine. You may need this after age 40.  Pneumococcal 13-valent conjugate (PCV13) vaccine. One dose is recommended after age 60.  Pneumococcal polysaccharide (PPSV23) vaccine. One dose is recommended after age 28. Talk to your health care provider about which screenings and vaccines you need and how often you need them. This information is not intended to replace advice given to you by your health care provider. Make sure you discuss any questions you have with your health care provider. Document Released: 08/26/2015 Document Revised: 04/18/2016 Document Reviewed: 05/31/2015 Elsevier Interactive Patient Education  2017 [employer] Prevention in the Home Falls can cause injuries. They can happen to people of all ages. There are many things you can do to make your home safe and to help prevent falls. What can I do on the outside of my home?  Regularly fix the edges of walkways and driveways and fix any cracks.  Remove anything that might make you trip as you walk through a door, such as a raised step or threshold.  Trim any bushes or trees on the path to your home.  Use bright outdoor lighting.  Clear any walking paths of anything that might make someone trip, such as rocks or tools.  Regularly check to see if handrails are loose or broken. Make sure that both sides of any steps have handrails.  Any raised decks and porches should have guardrails on the edges.  Have any leaves, snow, or ice cleared regularly.  Use sand or salt on walking paths during winter.  Clean up any spills in your garage right away. This includes oil or grease spills. What can I do in the bathroom?  Use night lights.  Install grab bars by the toilet and in the tub and shower. Do not use towel bars as grab bars.  Use  non-skid mats or decals in the tub or shower.  If you need to sit down in the shower, use a plastic, non-slip stool.  Keep the floor dry. Clean up any water that spills on the floor as soon as it happens.  Remove soap buildup in the tub or shower regularly.  Attach bath mats securely with double-sided non-slip rug tape.  Do not have throw rugs and other things on the floor that can make you trip. What can I do in the bedroom?  Use night lights.  Make sure that you have a light by your bed that is easy to reach.  Do not use any sheets or blankets that are too big for your bed. They should not hang down onto the floor.  Have a firm chair that has side arms. You can use this for support while you get dressed.  Do not have throw rugs and other things on the floor that can make you trip. What can I do in the kitchen?  Clean up any spills right away.  Avoid walking on wet floors.  Keep items that you use a lot in easy-to-reach places.  If you need to reach something  above you, use a strong step stool that has a grab bar.  Keep electrical cords out of the way.  Do not use floor polish or wax that makes floors slippery. If you must use wax, use non-skid floor wax.  Do not have throw rugs and other things on the floor that can make you trip. What can I do with my stairs?  Do not leave any items on the stairs.  Make sure that there are handrails on both sides of the stairs and use them. Fix handrails that are broken or loose. Make sure that handrails are as long as the stairways.  Check any carpeting to make sure that it is firmly attached to the stairs. Fix any carpet that is loose or worn.  Avoid having throw rugs at the top or bottom of the stairs. If you do have throw rugs, attach them to the floor with carpet tape.  Make sure that you have a light switch at the top of the stairs and the bottom of the stairs. If you do not have them, ask someone to add them for you. What  else can I do to help prevent falls?  Wear shoes that:  Do not have high heels.  Have rubber bottoms.  Are comfortable and fit you well.  Are closed at the toe. Do not wear sandals.  If you use a stepladder:  Make sure that it is fully opened. Do not climb a closed stepladder.  Make sure that both sides of the stepladder are locked into place.  Ask someone to hold it for you, if possible.  Clearly mark and make sure that you can see:  Any grab bars or handrails.  First and last steps.  Where the edge of each step is.  Use tools that help you move around (mobility aids) if they are needed. These include:  Canes.  Walkers.  Scooters.  Crutches.  Turn on the lights when you go into a dark area. Replace any light bulbs as soon as they burn out.  Set up your furniture so you have a clear path. Avoid moving your furniture around.  If any of your floors are uneven, fix them.  If there are any pets around you, be aware of where they are.  Review your medicines with your doctor. Some medicines can make you feel dizzy. This can increase your chance of falling. Ask your doctor what other things that you can do to help prevent falls. This information is not intended to replace advice given to you by your health care provider. Make sure you discuss any questions you have with your health care provider. Document Released: 05/26/2009 Document Revised: 01/05/2016 Document Reviewed: 09/03/2014 Elsevier Interactive Patient Education  2017 [employer].

## 2016-11-15 NOTE — Progress Notes (Signed)
Subjective:   Taylor Mitchell is a 76 y.o. female who presents for Medicare Annual (Subsequent) preventive examination.  Review of Systems:  Cardiac Risk Factors include: advanced age (>7men, >47 women);dyslipidemia;sedentary lifestyle;smoking/ tobacco exposure;obesity (BMI >30kg/m2)     Objective:     Vitals: BP 120/70   Pulse (!) 59   Temp 98 F (36.7 C) (Oral)   Ht 5\' 6"  (1.676 m)   Wt 188 lb 1.3 oz (85.3 kg)   SpO2 96%   BMI 30.36 kg/m   Body mass index is 30.36 kg/m.   Tobacco History  Smoking Status  . Former Smoker  . Packs/day: 1.00  . Years: 20.00  . Types: Cigarettes  . Start date: 08/14/1955  . Quit date: 08/14/1983  Smokeless Tobacco  . Current User  . Types: Snuff     Ready to quit: Not Answered Counseling given: Not Answered   Past Medical History:  Diagnosis Date  . Allergic rhinitis   . Anxiety   . Chronic back pain    Seeing chiropractor for almost 28 years, fell off a tractor at age 52  . Diabetes mellitus, type 2 (HCC)    Prediabetic, diet controlled  . Dyspepsia   . Hearing loss   . Hyperlipidemia    Intolerant of all medication  . Peripheral neuropathy ([place])   . Vitamin D deficiency    Past Surgical History:  Procedure Laterality Date  . COLONOSCOPY N/A 02/21/2016   Procedure: COLONOSCOPY;  Surgeon: Aviva Signs, MD;  Location[facility];  Service: Gastroenterology;  Laterality: N/A;  . INGUINAL HERNIA REPAIR Right 2004 approx  . NASAL SEPTUM SURGERY  1967  . PARTIAL HYSTERECTOMY    . TONSILLECTOMY     Family History  Problem Relation Age of Onset  . Ovarian cancer Mother   . Kidney disease Brother   . Kidney failure Brother   . Cirrhosis Sister   . Bladder Cancer Father   . Heart attack Sister   . Heart attack Sister   . Heart attack Brother    History  Sexual Activity  . Sexual activity: No    Outpatient Encounter Prescriptions as of 11/15/2016  Medication Sig  . ALPRAZolam (XANAX) 0.25 MG tablet Take 1 tablet by  mouth twice a day as needed  . aspirin (BAYER ASPIRIN EC LOW DOSE) 81 MG EC tablet Take 81 mg by mouth daily.    . fish oil-omega-3 fatty acids 1000 MG capsule Take 2 g by mouth daily.   . fluticasone (FLONASE) 50 MCG/ACT nasal spray Place 1 spray into both nostrils daily as needed.   . loratadine (CLARITIN) 10 MG tablet Take 10 mg by mouth daily as needed.   . Misc Natural Products (WHITE WILLOW BARK PO) Take 1 tablet by mouth daily as needed.  . Multiple Vitamins-Minerals (ONE-A-DAY WEIGHT SMART ADVANCE) TABS Take by mouth daily.    . [DISCONTINUED] Glucos-MSM-C-Mn-Ginger-Willow (MSM GLUCOSAMINE COMPLEX PO) Take by mouth. One tab daily   No facility-administered encounter medications on file as of 11/15/2016.     Activities of Daily Living In your present state of health, do you have any difficulty performing the following activities: 11/15/2016  Hearing? Y  Vision? Y  Difficulty concentrating or making decisions? Y  Walking or climbing stairs? N  Dressing or bathing? N  Doing errands, shopping? N  Preparing Food and eating ? N  Using the Toilet? N  In the past six months, have you accidently leaked urine? N  Do  you have problems with loss of bowel control? N  Managing your Medications? N  Managing your Finances? N  Housekeeping or managing your Housekeeping? N  Some recent data might be hidden    Patient Care Team: Fayrene Helper, MD as PCP - General Allyn Kenner, MD as Consulting Physician (Dermatology) Satira Sark, MD as Consulting Physician (Cardiology)    Assessment:    Exercise Activities and Dietary recommendations Current Exercise Habits: The patient does not participate in regular exercise at present, Exercise limited by: None identified  Goals    . Exercise 3x per week (30 min per time)          Recommend starting a routine exercise program at least 3 days a week for 30-45 minutes at a time as tolerated.        Fall Risk Fall Risk  11/15/2016 01/31/2016  09/19/2015 06/09/2015 09/22/2014  Falls in the past year? Yes No Yes No No  Number falls in past yr: 1 - 1 - -  Injury with Fall? No - No - -  Risk for fall due to : - - - - -  Follow up Falls evaluation completed;Falls prevention discussed - Falls evaluation completed - -   Depression Screen PHQ 2/9 Scores 11/15/2016 09/19/2015 06/15/2013 06/15/2013  PHQ - 2 Score 0 0 0 0     Cognitive Function: Normal   6CIT Screen 11/15/2016  What Year? 0 points  What month? 0 points  What time? 0 points  Count back from 20 0 points  Months in reverse 0 points  Repeat phrase 0 points  Total Score 0    Immunization History  Administered Date(s) Administered  . Influenza Split 04/17/2012, 05/14/2013  . Influenza,inj,Quad PF,36+ Mos 05/18/2014, 05/02/2015, 06/19/2016  . Pneumococcal Conjugate-13 03/02/2014  . Pneumococcal Polysaccharide-23 09/19/2015  . Td 10/28/2008   Screening Tests Health Maintenance  Topic Date Due  . HEMOGLOBIN A1C  12/12/2016  . INFLUENZA VACCINE  03/13/2017  . MAMMOGRAM  10/31/2017  . TETANUS/TDAP  10/29/2018  . COLONOSCOPY  02/20/2021  . DEXA SCAN  Completed  . PNA vac Low Risk Adult  Completed      Plan:  I have personally reviewed and addressed the Medicare Annual Wellness questionnaire and have noted the following in the patient's chart:  A. Medical and social history B. Use of alcohol, tobacco or illicit drugs  C. Current medications and supplements D. Functional ability and status E.  Nutritional status F.  Physical activity G. Advance directives H. List of other physicians I.  Hospitalizations, surgeries, and ER visits in previous 12 months J.  [place] to include cognitive, depression, and falls L. Referrals and appointments - none  In addition, I have reviewed and discussed with patient certain preventive protocols, quality metrics, and best practice recommendations. A written personalized care plan for preventive services as well as general  preventive health recommendations were provided to patient.  Signed,   Stormy Fabian, LPN Lead Nurse Health Advisor

## 2016-11-30 ENCOUNTER — Other Ambulatory Visit: Payer: Self-pay | Admitting: Family Medicine

## 2016-11-30 DIAGNOSIS — Z1231 Encounter for screening mammogram for malignant neoplasm of breast: Secondary | ICD-10-CM

## 2016-12-21 ENCOUNTER — Ambulatory Visit
Admission: RE | Admit: 2016-12-21 | Discharge: 2016-12-21 | Disposition: A | Discharge status: Home / Self Care | Payer: Medicare Other | Source: Ambulatory Visit | Attending: Family Medicine | Admitting: Family Medicine

## 2016-12-21 DIAGNOSIS — Z1231 Encounter for screening mammogram for malignant neoplasm of breast: Secondary | ICD-10-CM | POA: Insufficient documentation

## 2017-01-16 ENCOUNTER — Ambulatory Visit (INDEPENDENT_AMBULATORY_CARE_PROVIDER_SITE_OTHER): Payer: Medicare Other | Admitting: Family Medicine

## 2017-01-16 ENCOUNTER — Encounter: Payer: Self-pay | Admitting: Family Medicine

## 2017-01-16 VITALS — BP 110/70 | HR 86 | Resp 16 | Ht 66.0 in | Wt 179.0 lb

## 2017-01-16 DIAGNOSIS — R7303 Prediabetes: Secondary | ICD-10-CM | POA: Diagnosis not present

## 2017-01-16 DIAGNOSIS — J302 Other seasonal allergic rhinitis: Secondary | ICD-10-CM | POA: Diagnosis not present

## 2017-01-16 DIAGNOSIS — E785 Hyperlipidemia, unspecified: Secondary | ICD-10-CM | POA: Diagnosis not present

## 2017-01-16 DIAGNOSIS — E663 Overweight: Secondary | ICD-10-CM | POA: Diagnosis not present

## 2017-01-16 DIAGNOSIS — F411 Generalized anxiety disorder: Secondary | ICD-10-CM | POA: Diagnosis not present

## 2017-01-16 DIAGNOSIS — E8881 Metabolic syndrome: Secondary | ICD-10-CM | POA: Diagnosis not present

## 2017-01-16 MED ORDER — ALPRAZOLAM 0.25 MG PO TABS: ORAL_TABLET | ORAL | 0 refills | Status: DC

## 2017-01-16 NOTE — Patient Instructions (Addendum)
Annual physical exam in October, call if you need me sooner  Fasting lipid, and hBA1C,, as soon as possible  It is important that you exercise regularly at least 30 minutes 5 times a week. If you develop chest pain, have severe difficulty breathing, or feel very tired, stop exercising immediately and seek medical attention   Please work on good  health habits so that your health will improve. 1. Commitment to daily physical activity for 30 to 60  minutes, if you are able to do this.  2. Commitment to wise food choices. Aim for half of your  food intake to be vegetable and fruit, one quarter starchy foods, and one quarter protein. Try to eat on a regular schedule  3 meals per day, snacking between meals should be limited to vegetables or fruits or small portions of nuts. 64 ounces of water per day is generally recommended, unless you have specific health conditions, like heart failure or kidney failure where you will need to limit fluid intake.  3. Commitment to sufficient and a  good quality of physical and mental rest daily, generally between 6 to 8 hours per day.  WITH PERSISTANCE AND PERSEVERANCE, THE IMPOSSIBLE , BECOMES THE NORM!   Thank you  for choosing [facility]. We consider it a privelige to serve you.  Delivering excellent health care in a caring and  compassionate way is our goal.  Partnering with you,  so that together we can achieve this goal is our strategy.   Use xanax SPARINGLY for anxiety   Congrats on weight loss

## 2017-01-17 LAB — LIPID PANEL
CHOLESTEROL: 252 mg/dL — AB (ref <200)
HDL: 52 mg/dL (ref >50)
LDL Cholesterol: 181 mg/dL — ABNORMAL HIGH (ref <100)
Total CHOL/HDL Ratio: 4.8 Ratio (ref <5.0)
Triglycerides: 96 mg/dL (ref <150)
VLDL: 19 mg/dL (ref <30)

## 2017-01-18 LAB — HEMOGLOBIN A1C
HEMOGLOBIN A1C: 6.4 % — AB (ref <5.7)
MEAN PLASMA GLUCOSE: 137 mg/dL

## 2017-01-19 ENCOUNTER — Encounter: Payer: Self-pay | Admitting: Family Medicine

## 2017-01-19 NOTE — Assessment & Plan Note (Signed)
Improved Hyperlipidemia:Low fat diet discussed and encouraged.   Lipid Panel  Lab Results  Component Value Date   CHOL 252 (H) 01/16/2017   HDL 52 01/16/2017   LDLCALC 181 (H) 01/16/2017   TRIG 96 01/16/2017   CHOLHDL 4.8 01/16/2017

## 2017-01-19 NOTE — Assessment & Plan Note (Signed)
Controlled, no change in medication  

## 2017-01-19 NOTE — Assessment & Plan Note (Signed)
improved Patient re-educated about  the importance of commitment to a  minimum of 150 minutes of exercise per week.  The importance of healthy food choices with portion control discussed. Encouraged to start a food diary, count calories and to consider  joining a support group. Sample diet sheets offered. Goals set by the patient for the next several months.   Weight /BMI 01/16/2017 11/15/2016 06/19/2016  WEIGHT 179 lb 188 lb 1.3 oz 188 lb  HEIGHT 5\' 6"  5\' 6"  5\' 6"   BMI 28.89 kg/m2 30.36 kg/m2 30.34 kg/m2

## 2017-01-19 NOTE — Assessment & Plan Note (Signed)
Deteriorated Patient educated about the importance of limiting  Carbohydrate intake , the need to commit to daily physical activity for a minimum of 30 minutes , and to commit weight loss. The fact that changes in all these areas will reduce or eliminate all together the development of diabetes is stressed.   Diabetic Labs Latest Ref Rng & Units 01/16/2017 06/14/2016 01/26/2016 09/13/2015 06/07/2015  HbA1c <5.7 % 6.4(H) 6.2(H) - 6.4(H) 6.4(H)  Chol <200 mg/dL 252(H) 292(H) 280(H) 314(H) 281(H)  HDL >50 mg/dL 52 46 54 54 44(L)  Calc LDL <100 mg/dL 181(H) 219(H) 197(H) 233(H) 209(H)  Triglycerides <150 mg/dL 96 133 144 136 142  Creatinine 0.60 - 0.93 mg/dL - 0.69 0.63 - -   BP/Weight 01/16/2017 11/15/2016 06/19/2016 02/21/2016 01/31/2016 4/54/0981 08/21/1476  Systolic BP 295 621 308 657 846 962 952  Diastolic BP 70 70 74 60 72 78 76  Wt. (Lbs) 179 188.08 188 191 191 187 185  BMI 28.89 30.36 30.34 30.84 30.84 30.2 29.63   No flowsheet data found.  Updated lab needed at/ before next visit.

## 2017-01-19 NOTE — Assessment & Plan Note (Signed)
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

## 2017-01-19 NOTE — Progress Notes (Signed)
Taylor Mitchell     MRN: [id number]      DOB: 15-May-1941   HPI Taylor Mitchell is here for follow up and re-evaluation of chronic medical conditions, medication management and review of any available recent lab and radiology data.  Preventive health is updated, specifically  Cancer screening and Immunization.   Questions or concerns regarding consultations or procedures which the PT has had in the interim are  addressed. The PT denies any adverse reactions to current medications since the last visit.  There are no new concerns.  There are no specific complaints   ROS Denies recent fever or chills. Denies sinus pressure, nasal congestion, ear pain or sore throat. Denies chest congestion, productive cough or wheezing. Denies chest pains, palpitations and leg swelling Denies abdominal pain, nausea, vomiting,diarrhea or constipation.   Denies dysuria, frequency, hesitancy or incontinence. Denies joint pain, swelling and limitation in mobility. Denies headaches, seizures, numbness, or tingling. Denies depression, doses c/o anxiety denies  insomnia. Denies skin break down or rash.   PE  BP 110/70   Pulse 86   Resp 16   Ht 5\' 6"  (1.676 m)   Wt 179 lb (81.2 kg)   BMI 28.89 kg/m   Patient alert and oriented and in no cardiopulmonary distress.  HEENT: No facial asymmetry, EOMI,   oropharynx pink and moist.  Neck supple no JVD, no mass.  Chest: Clear to auscultation bilaterally.  CVS: S1, S2 no murmurs, no S3.Regular rate.  ABD: Soft non tender.   Ext: No edema  MS: Adequate ROM spine, shoulders, hips and knees.  Skin: Intact, no ulcerations or rash noted.  Psych: Good eye contact, normal affect. Memory intact not anxious or depressed appearing.  CNS: CN 2-12 intact, power,  normal throughout.no focal deficits noted.   Assessment & Plan  Hyperlipidemia Improved Hyperlipidemia:Low fat diet discussed and encouraged.   Lipid Panel  Lab Results  Component Value Date   CHOL 252 (H) 01/16/2017   HDL 52 01/16/2017   LDLCALC 181 (H) 01/16/2017   TRIG 96 01/16/2017   CHOLHDL 4.8 94/17/4081       Metabolic syndrome X The increased risk of cardiovascular disease associated with this diagnosis, and the need to consistently work on lifestyle to change this is discussed. Following  a  heart healthy diet ,commitment to 30 minutes of exercise at least 5 days per week, as well as control of blood sugar and cholesterol , and achieving a healthy weight are all the areas to be addressed .   Prediabetes Deteriorated Patient educated about the importance of limiting  Carbohydrate intake , the need to commit to daily physical activity for a minimum of 30 minutes , and to commit weight loss. The fact that changes in all these areas will reduce or eliminate all together the development of diabetes is stressed.   Diabetic Labs Latest Ref Rng & Units 01/16/2017 06/14/2016 01/26/2016 09/13/2015 06/07/2015  HbA1c <5.7 % 6.4(H) 6.2(H) - 6.4(H) 6.4(H)  Chol <200 mg/dL 252(H) 292(H) 280(H) 314(H) 281(H)  HDL >50 mg/dL 52 46 54 54 44(L)  Calc LDL <100 mg/dL 181(H) 219(H) 197(H) 233(H) 209(H)  Triglycerides <150 mg/dL 96 133 144 136 142  Creatinine 0.60 - 0.93 mg/dL - 0.69 0.63 - -   BP/Weight 01/16/2017 11/15/2016 06/19/2016 02/21/2016 01/31/2016 4/48/1856 10/11/4968  Systolic BP 263 785 885 027 741 287 867  Diastolic BP 70 70 74 60 72 78 76  Wt. (Lbs) 179 188.08 188 191 191 187 185  BMI 28.89 30.36 30.34 30.84 30.84 30.2 29.63   No flowsheet data found.  Updated lab needed at/ before next visit.   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  The importance of healthy food choices with portion  control discussed. Encouraged to start a food diary, count calories and to consider  joining a support group. Sample diet sheets offered. Goals set by the patient for the next several months.   Weight /BMI 01/16/2017 11/15/2016 06/19/2016  WEIGHT 179 lb 188 lb 1.3 oz 188 lb  HEIGHT 5\' 6"  5\' 6"  5\' 6"   BMI 28.89 kg/m2 30.36 kg/m2 30.34 kg/m2      \

## 2017-01-19 NOTE — Assessment & Plan Note (Signed)
The increased risk of cardiovascular disease associated with this diagnosis, and the need to consistently work on lifestyle to change this is discussed. Following  a  heart healthy diet ,commitment to 30 minutes of exercise at least 5 days per week, as well as control of blood sugar and cholesterol , and achieving a healthy weight are all the areas to be addressed .  

## 2017-01-21 ENCOUNTER — Telehealth: Payer: Self-pay | Admitting: Family Medicine

## 2017-02-26 ENCOUNTER — Telehealth: Payer: Self-pay | Admitting: Family Medicine

## 2017-02-26 NOTE — Telephone Encounter (Signed)
Right ankle pain for the past month or so. Hurts down into her foot towards the pinky toe side. Sharp pains that seem to be getting worse. Wants to know if you want to see her (can't come today) nor have xray today but she can tomorrow. Please advise

## 2017-02-26 NOTE — Telephone Encounter (Signed)
New Message  Pt voiced she is having ankle and foot trouble and wondering if she needs to be seen to have an xray or if an order can be placed in.  Please f/u

## 2017-02-26 NOTE — Telephone Encounter (Signed)
pls explain I am out till next week,no available slot in am, if Dr Meda Coffee has available slotsthis week  pls arreange a visit with her, if not then I recommend urgent care

## 2017-02-27 NOTE — Telephone Encounter (Signed)
Patient states she can wait until next week and appt was scheduled

## 2017-02-28 DIAGNOSIS — M9905 Segmental and somatic dysfunction of pelvic region: Secondary | ICD-10-CM | POA: Diagnosis not present

## 2017-02-28 DIAGNOSIS — M545 Low back pain: Secondary | ICD-10-CM | POA: Diagnosis not present

## 2017-02-28 DIAGNOSIS — M546 Pain in thoracic spine: Secondary | ICD-10-CM | POA: Diagnosis not present

## 2017-02-28 DIAGNOSIS — M9902 Segmental and somatic dysfunction of thoracic region: Secondary | ICD-10-CM | POA: Diagnosis not present

## 2017-02-28 DIAGNOSIS — M9903 Segmental and somatic dysfunction of lumbar region: Secondary | ICD-10-CM | POA: Diagnosis not present

## 2017-03-04 ENCOUNTER — Encounter: Payer: Self-pay | Admitting: Family Medicine

## 2017-03-04 ENCOUNTER — Ambulatory Visit (INDEPENDENT_AMBULATORY_CARE_PROVIDER_SITE_OTHER): Payer: Medicare Other | Admitting: Family Medicine

## 2017-03-04 VITALS — BP 114/80 | HR 64 | Temp 97.9°F | Resp 18 | Ht 66.0 in | Wt 181.1 lb

## 2017-03-04 DIAGNOSIS — E782 Mixed hyperlipidemia: Secondary | ICD-10-CM

## 2017-03-04 DIAGNOSIS — G8929 Other chronic pain: Secondary | ICD-10-CM | POA: Diagnosis not present

## 2017-03-04 DIAGNOSIS — M25571 Pain in right ankle and joints of right foot: Secondary | ICD-10-CM

## 2017-03-04 NOTE — Patient Instructions (Addendum)
F/u in October 2, at 1:20 pm as befopre, call, if you need  Me sooner  You are referred to Dr Luna Glasgow, re right ankle pain, soonest available.  Thank you  for choosing [facility]. We consider it a privelige to serve you.  Delivering excellent health care in a caring and  compassionate way is our goal.  Partnering with you,  so that together we can achieve this goal is our strategy.

## 2017-03-04 NOTE — Assessment & Plan Note (Signed)
8 month h/o right ankle pain s/p trauma, fell and injured it in mid December, intermittent pain and swelling , worse last week up to an 8, needs ortho eval, tender on lateral aspect

## 2017-03-05 NOTE — Assessment & Plan Note (Signed)
Hyperlipidemia:Low fat diet discussed and encouraged.   Lipid Panel  Lab Results  Component Value Date   CHOL 252 (H) 01/16/2017   HDL 52 01/16/2017   LDLCALC 181 (H) 01/16/2017   TRIG 96 01/16/2017   CHOLHDL 4.8 01/16/2017  Updated lab needed at/ before next visit.

## 2017-03-05 NOTE — Progress Notes (Signed)
   Taylor Mitchell     MRN: [id number]      DOB: 09/17/1940   HPI [name] is here with an 8 month h/o intermittent right ankle pain and welling. She missed a step in mid December, and has had problems since then, she recalls twisting the ankle, the tenderness is on the outer aspect of the ankle . For the past 1 to 2 weeks she has had increased and persistent symptoms with no new trauma and is now wanting Professional help  ROS Denies recent fever or chills. Denies sinus pressure, nasal congestion, ear pain or sore throat. Denies chest congestion, productive cough or wheezing. Denies chest pains, palpitations and leg swelling . Denies depression, anxiety or insomnia. Denies skin break down or rash.   PE  BP 114/80 (BP Location: Left Arm, Patient Position: Sitting, Cuff Size: Normal)   Pulse 64   Temp 97.9 F (36.6 C) (Temporal)   Resp 18   Ht 5\' 6"  (1.676 m)   Wt 181 lb 1.9 oz (82.2 kg)   SpO2 97%   BMI 29.23 kg/m   Patient alert and oriented and in no cardiopulmonary distress.  HEENT: No facial asymmetry, EOMI,   oropharynx pink and moist.  Neck supple no JVD, no mass.  Chest: Clear to auscultation bilaterally.  CVS: S1, S2 no murmurs, no S3.Regular rate.  ABD: Soft non tender.   Ext: No edema  MS: Adequate ROM spine, shoulders, hips and knees.Decreased ROM right ankle with swelling , redness and tenderness of lateral aspect  Skin: Intact, no ulcerations or rash noted.  Psych: Good eye contact, normal affect. Memory intact not anxious or depressed appearing.  CNS: CN 2-12 intact, power,  normal throughout.no focal deficits noted.   Assessment & Plan  Ankle pain, right 8 month h/o right ankle pain s/p trauma, fell and injured it in mid December, intermittent pain and swelling , worse last week up to an 8, needs ortho eval, tender on lateral aspect  Hyperlipidemia Hyperlipidemia:Low fat diet discussed and encouraged.   Lipid Panel  Lab Results  Component  Value Date   CHOL 252 (H) 01/16/2017   HDL 52 01/16/2017   LDLCALC 181 (H) 01/16/2017   TRIG 96 01/16/2017   CHOLHDL 4.8 01/16/2017  Updated lab needed at/ before next visit.

## 2017-03-07 ENCOUNTER — Encounter: Payer: Self-pay | Admitting: Orthopaedic Surgery

## 2017-03-07 ENCOUNTER — Ambulatory Visit (INDEPENDENT_AMBULATORY_CARE_PROVIDER_SITE_OTHER): Payer: Medicare Other | Admitting: Orthopaedic Surgery

## 2017-03-07 ENCOUNTER — Ambulatory Visit (INDEPENDENT_AMBULATORY_CARE_PROVIDER_SITE_OTHER): Payer: Medicare Other

## 2017-03-07 VITALS — BP 123/70 | HR 65 | Temp 97.1°F | Ht 66.0 in | Wt 181.0 lb

## 2017-03-07 DIAGNOSIS — G8929 Other chronic pain: Secondary | ICD-10-CM

## 2017-03-07 DIAGNOSIS — M25571 Pain in right ankle and joints of right foot: Secondary | ICD-10-CM

## 2017-03-07 DIAGNOSIS — M79671 Pain in right foot: Secondary | ICD-10-CM | POA: Diagnosis not present

## 2017-03-07 NOTE — Progress Notes (Signed)
Subjective:    Patient ID: Taylor Mitchell, female    DOB: [birth year], 76 y.o.   MRN: [id number]  HPI She slipped on a step right before Christmas 2017 and hurt her right ankle and foot.  She used ice and stayed off it a while.  Over the last seven months she has been favoring it more and more.  She has more pain of the distal lateral foot and has had some swelling.  Because the distal foot hurts, she walks differently and now has ankle pain as well.  She saw Dr. Moshe Cipro recently and is referred here.  She has not changed shoe wear.  She has used ice and heat and Tylenol with slight help.  She has no redness. She has feeling of numbness at times.  She has some swelling.   Review of Systems  HENT: Positive for hearing loss. Negative for congestion.   Respiratory: Negative for cough and shortness of breath.   Cardiovascular: Negative for chest pain and leg swelling.  Endocrine: Negative for cold intolerance.  Musculoskeletal: Positive for arthralgias, gait problem and joint swelling.  Allergic/Immunologic: Positive for environmental allergies.  Psychiatric/Behavioral: The patient is nervous/anxious.    Past Medical History:  Diagnosis Date  . Allergic rhinitis   . Anxiety   . Chronic back pain    Seeing chiropractor for almost 58 years, fell off a tractor at age 74  . Diabetes mellitus, type 2 (HCC)    Prediabetic, diet controlled  . Dyspepsia   . Hearing loss   . Hyperlipidemia    Intolerant of all medication  . Peripheral neuropathy   . Vitamin D deficiency     Past Surgical History:  Procedure Laterality Date  . COLONOSCOPY N/A 02/21/2016   Procedure: COLONOSCOPY;  Surgeon: Aviva Signs, MD;  Location: [facility];  Service: Gastroenterology;  Laterality: N/A;  . INGUINAL HERNIA REPAIR Right 2004 approx  . NASAL SEPTUM SURGERY  1967  . OOPHORECTOMY Bilateral   . PARTIAL HYSTERECTOMY    . TONSILLECTOMY      Current Outpatient Prescriptions on File Prior to Visit    Medication Sig Dispense Refill  . ALPRAZolam (XANAX) 0.25 MG tablet One half tablet three times weekly , as needed, fore anxiety 30 tablet 0  . aspirin (BAYER ASPIRIN EC LOW DOSE) 81 MG EC tablet Take 81 mg by mouth daily.      . fish oil-omega-3 fatty acids 1000 MG capsule Take 2 g by mouth daily.     . fluticasone (FLONASE) 50 MCG/ACT nasal spray Place 1 spray into both nostrils daily as needed.     . loratadine (CLARITIN) 10 MG tablet Take 10 mg by mouth daily as needed.     . Misc Natural Products (WHITE WILLOW BARK PO) Take 1 tablet by mouth daily as needed.    . Multiple Vitamins-Minerals (ONE-A-DAY WEIGHT SMART ADVANCE) TABS Take by mouth daily.       No current facility-administered medications on file prior to visit.     Social History   Social History  . Marital status: Married    Spouse name: N/A  . Number of children: N/A  . Years of education: N/A   Occupational History  . Not on file.   Social History Main Topics  . Smoking status: Former Smoker    Packs/day: 1.00    Years: 20.00    Types: Cigarettes    Start date: 08/14/1955    Quit date: 08/14/1983  . Smokeless tobacco:  Current User    Types: Snuff  . Alcohol use No  . Drug use: No  . Sexual activity: No   Other Topics Concern  . Not on file   Social History Narrative  . No narrative on file    Family History  Problem Relation Age of Onset  . Ovarian cancer Mother   . Kidney disease Brother   . Kidney failure Brother   . Cirrhosis Sister   . Bladder Cancer Father   . Heart attack Sister   . Heart attack Sister   . Heart attack Brother   . Breast cancer Neg Hx     BP 123/70   Pulse 65   Temp (!) 97.1 F (36.2 C)   Ht 5\' 6"  (1.676 m)   Wt 181 lb (82.1 kg)   BMI 29.21 kg/m      Objective:   Physical Exam  Constitutional: She is oriented to person, place, and time. She appears well-developed and well-nourished.  HENT:  Head: Normocephalic and atraumatic.  Eyes: Pupils are equal, round,  and reactive to light. Conjunctivae and EOM are normal.  Neck: Normal range of motion. Neck supple.  Cardiovascular: Normal rate, regular rhythm and intact distal pulses.   Pulmonary/Chest: Effort normal.  Abdominal: Soft.  Musculoskeletal: She exhibits tenderness (Tender left foot, more over the fifth metatarsal head area and third metatarsal head with no swelling.  NV intact. ROM ankle is full as on the right.  Limp left.  No redness.).  Neurological: She is alert and oriented to person, place, and time. She displays normal reflexes. No cranial nerve deficit. She exhibits normal muscle tone. Coordination normal.  Skin: Skin is warm and dry.  Psychiatric: She has a normal mood and affect. Her behavior is normal. Judgment and thought content normal.  Vitals reviewed.  X-rays were done of the left foot and ankle, reported separately.       Assessment & Plan:   Encounter Diagnoses  Name Primary?  . Chronic pain of right ankle Yes  . Chronic pain in right foot    I would like to get MRI of the left foot as there is some demineralization of the fifth metatarsal head and cyst of the third metatarsal.  Return after MRI.  Call if any problem.  Precautions discussed.   Electronically Signed Sanjuana Kava, MD 7/26/201811:17 AM

## 2017-03-18 ENCOUNTER — Ambulatory Visit (HOSPITAL_COMMUNITY)
Admission: RE | Admit: 2017-03-18 | Discharge: 2017-03-18 | Disposition: A | Discharge status: Home / Self Care | Payer: Medicare Other | Source: Ambulatory Visit | Attending: Orthopaedic Surgery | Admitting: Orthopaedic Surgery

## 2017-03-18 DIAGNOSIS — M25571 Pain in right ankle and joints of right foot: Secondary | ICD-10-CM | POA: Insufficient documentation

## 2017-03-18 DIAGNOSIS — M2010 Hallux valgus (acquired), unspecified foot: Secondary | ICD-10-CM | POA: Diagnosis not present

## 2017-03-18 DIAGNOSIS — M19071 Primary osteoarthritis, right ankle and foot: Secondary | ICD-10-CM | POA: Insufficient documentation

## 2017-03-18 DIAGNOSIS — M79671 Pain in right foot: Secondary | ICD-10-CM | POA: Insufficient documentation

## 2017-03-18 DIAGNOSIS — G8929 Other chronic pain: Secondary | ICD-10-CM | POA: Diagnosis not present

## 2017-03-20 ENCOUNTER — Ambulatory Visit (INDEPENDENT_AMBULATORY_CARE_PROVIDER_SITE_OTHER): Payer: Medicare Other | Admitting: Orthopaedic Surgery

## 2017-03-20 ENCOUNTER — Encounter: Payer: Self-pay | Admitting: Orthopaedic Surgery

## 2017-03-20 VITALS — BP 138/75 | HR 56 | Temp 97.4°F | Ht 66.0 in | Wt 181.0 lb

## 2017-03-20 DIAGNOSIS — M25571 Pain in right ankle and joints of right foot: Secondary | ICD-10-CM | POA: Diagnosis not present

## 2017-03-20 DIAGNOSIS — G609 Hereditary and idiopathic neuropathy, unspecified: Secondary | ICD-10-CM | POA: Diagnosis not present

## 2017-03-20 DIAGNOSIS — M79671 Pain in right foot: Secondary | ICD-10-CM | POA: Diagnosis not present

## 2017-03-20 DIAGNOSIS — G8929 Other chronic pain: Secondary | ICD-10-CM | POA: Diagnosis not present

## 2017-03-20 NOTE — Progress Notes (Signed)
Patient VE:LFYBOF Taylor Mitchell, female DOB:02/08/1941, 76 y.o. BPZ:[id number]  Chief Complaint  Patient presents with  . Results    MRI Right Foot    HPI  Taylor Mitchell is a 76 y.o. female who has had right foot pain.  She has more of a burning pain.  She had a MRI which showed: IMPRESSION: No acute or focal abnormality.  Mild midfoot osteoarthritis.  Hallux valgus.  I feel more of her pain is related to peripheral neuropathy.  She will discuss this further with Dr. Moshe Cipro.   HPI  Body mass index is 29.21 kg/m.  ROS  Review of Systems  HENT: Positive for hearing loss. Negative for congestion.   Respiratory: Negative for cough and shortness of breath.   Cardiovascular: Negative for chest pain and leg swelling.  Endocrine: Negative for cold intolerance.  Musculoskeletal: Positive for arthralgias, gait problem and joint swelling.  Allergic/Immunologic: Positive for environmental allergies.  Psychiatric/Behavioral: The patient is nervous/anxious.     Past Medical History:  Diagnosis Date  . Allergic rhinitis   . Anxiety   . Chronic back pain    Seeing chiropractor for almost 30 years, fell off a tractor at age 72  . Diabetes mellitus, type 2 (HCC)    Prediabetic, diet controlled  . Dyspepsia   . Hearing loss   . Hyperlipidemia    Intolerant of all medication  . Peripheral neuropathy   . Vitamin D deficiency     Past Surgical History:  Procedure Laterality Date  . COLONOSCOPY N/A 02/21/2016   Procedure: COLONOSCOPY;  Surgeon: Aviva Signs, MD;  Location: [facility];  Service: Gastroenterology;  Laterality: N/A;  . INGUINAL HERNIA REPAIR Right 2004 approx  . NASAL SEPTUM SURGERY  1967  . OOPHORECTOMY Bilateral   . PARTIAL HYSTERECTOMY    . TONSILLECTOMY      Family History  Problem Relation Age of Onset  . Ovarian cancer Mother   . Kidney disease Brother   . Kidney failure Brother   . Cirrhosis Sister   . Bladder Cancer Father   . Heart attack Sister    . Heart attack Sister   . Heart attack Brother   . Breast cancer Neg Hx     Social History Social History  Substance Use Topics  . Smoking status: Former Smoker    Packs/day: 1.00    Years: 20.00    Types: Cigarettes    Start date: 08/14/1955    Quit date: 08/14/1983  . Smokeless tobacco: Current User    Types: Snuff  . Alcohol use No    Allergies  Allergen Reactions  . Metoprolol   . Pseudoephedrine     REACTION: palpatations  . Corticosteroids Rash    Current Outpatient Prescriptions  Medication Sig Dispense Refill  . ALPRAZolam (XANAX) 0.25 MG tablet One half tablet three times weekly , as needed, fore anxiety 30 tablet 0  . aspirin (BAYER ASPIRIN EC LOW DOSE) 81 MG EC tablet Take 81 mg by mouth daily.      . fish oil-omega-3 fatty acids 1000 MG capsule Take 2 g by mouth daily.     . fluticasone (FLONASE) 50 MCG/ACT nasal spray Place 1 spray into both nostrils daily as needed.     . loratadine (CLARITIN) 10 MG tablet Take 10 mg by mouth daily as needed.     . Misc Natural Products (WHITE WILLOW BARK PO) Take 1 tablet by mouth daily as needed.    . Multiple Vitamins-Minerals (ONE-A-DAY WEIGHT  SMART ADVANCE) TABS Take by mouth daily.       No current facility-administered medications for this visit.      Physical Exam  Blood pressure 138/75, pulse (!) 56, temperature (!) 97.4 F (36.3 C), height 5\' 6"  (1.676 m), weight 181 lb (82.1 kg).  Constitutional: overall normal hygiene, normal nutrition, well developed, normal grooming, normal body habitus. Assistive device:none  Musculoskeletal: gait and station Limp none, muscle tone and strength are normal, no tremors or atrophy is present.  .  Neurological: coordination overall normal.  Deep tendon reflex/nerve stretch intact.  Sensation normal.  Cranial nerves II-XII intact.   Skin:   Normal overall no scars, lesions, ulcers or rashes. No psoriasis.  Psychiatric: Alert and oriented x 3.  Recent memory intact, remote  memory unclear.  Normal mood and affect. Well groomed.  Good eye contact.  Cardiovascular: overall no swelling, no varicosities, no edema bilaterally, normal temperatures of the legs and arms, no clubbing, cyanosis and good capillary refill.  Lymphatic: palpation is normal.  Right foot has some tenderness of the base of the right fifth metatarsal area.  She has no redness.  Sensation is good.  Pulses are present.  Color is normal.  The patient has been educated about the nature of the problem(s) and counseled on treatment options.  The patient appeared to understand what I have discussed and is in agreement with it.  Encounter Diagnoses  Name Primary?  . Chronic pain of right ankle Yes  . Chronic pain in right foot   . Hereditary and idiopathic peripheral neuropathy     PLAN Call if any problems.  Precautions discussed.  Continue current medications.   Return to clinic prn   Electronically Signed Sanjuana Kava, MD 8/8/201810:30 AM

## 2017-03-21 ENCOUNTER — Other Ambulatory Visit: Payer: Self-pay | Admitting: Family Medicine

## 2017-03-21 ENCOUNTER — Telehealth: Payer: Self-pay | Admitting: *Deleted

## 2017-03-21 MED ORDER — GABAPENTIN 100 MG PO CAPS: 100.0000 mg | ORAL_CAPSULE | Freq: Every day | ORAL | 3 refills | Status: DC

## 2017-03-21 NOTE — Telephone Encounter (Signed)
Patient called to get her gabapentin refilled. Please advise

## 2017-03-21 NOTE — Telephone Encounter (Signed)
Lowest dose sent for bedtime use pls let her know

## 2017-03-21 NOTE — Telephone Encounter (Signed)
Requesting gabapentin. Seen Dr Luna Glasgow yesterday and was told symptoms were due to neuropathy and to contact pcp

## 2017-03-21 NOTE — Telephone Encounter (Signed)
Patient aware.

## 2017-04-04 DIAGNOSIS — M546 Pain in thoracic spine: Secondary | ICD-10-CM | POA: Diagnosis not present

## 2017-04-04 DIAGNOSIS — M9902 Segmental and somatic dysfunction of thoracic region: Secondary | ICD-10-CM | POA: Diagnosis not present

## 2017-04-04 DIAGNOSIS — M545 Low back pain: Secondary | ICD-10-CM | POA: Diagnosis not present

## 2017-04-04 DIAGNOSIS — M9905 Segmental and somatic dysfunction of pelvic region: Secondary | ICD-10-CM | POA: Diagnosis not present

## 2017-04-04 DIAGNOSIS — M9903 Segmental and somatic dysfunction of lumbar region: Secondary | ICD-10-CM | POA: Diagnosis not present

## 2017-05-14 ENCOUNTER — Encounter: Payer: Self-pay | Admitting: Family Medicine

## 2017-05-14 ENCOUNTER — Ambulatory Visit (INDEPENDENT_AMBULATORY_CARE_PROVIDER_SITE_OTHER): Payer: Medicare Other | Admitting: Family Medicine

## 2017-05-14 VITALS — BP 120/76 | HR 84 | Temp 97.6°F | Resp 16 | Ht 66.0 in | Wt 184.2 lb

## 2017-05-14 DIAGNOSIS — L989 Disorder of the skin and subcutaneous tissue, unspecified: Secondary | ICD-10-CM

## 2017-05-14 DIAGNOSIS — Z0000 Encounter for general adult medical examination without abnormal findings: Secondary | ICD-10-CM | POA: Diagnosis not present

## 2017-05-14 DIAGNOSIS — Z1211 Encounter for screening for malignant neoplasm of colon: Secondary | ICD-10-CM | POA: Diagnosis not present

## 2017-05-14 DIAGNOSIS — Z23 Encounter for immunization: Secondary | ICD-10-CM | POA: Diagnosis not present

## 2017-05-14 DIAGNOSIS — R309 Painful micturition, unspecified: Secondary | ICD-10-CM | POA: Diagnosis not present

## 2017-05-14 DIAGNOSIS — E782 Mixed hyperlipidemia: Secondary | ICD-10-CM

## 2017-05-14 DIAGNOSIS — R7303 Prediabetes: Secondary | ICD-10-CM

## 2017-05-14 LAB — HEMOCCULT GUIAC POC 1CARD (OFFICE): Fecal Occult Blood, POC: NEGATIVE

## 2017-05-14 LAB — POCT URINALYSIS DIPSTICK
BILIRUBIN UA: NEGATIVE
Glucose, UA: NEGATIVE
KETONES UA: NEGATIVE
LEUKOCYTES UA: NEGATIVE
Nitrite, UA: NEGATIVE
PROTEIN UA: NEGATIVE
SPEC GRAV UA: 1.025 (ref 1.010–1.025)
Urobilinogen, UA: 0.2 E.U./dL
pH, UA: 6 (ref 5.0–8.0)

## 2017-05-14 NOTE — Patient Instructions (Addendum)
Wellness in mid January with nurse  Flu vaccine today  MD f/u in 6 months  You are referred to dermatology for skin exam    Fasting lipid, cmp and hBa1C, CBC, tSh, Vit D first week in January

## 2017-05-14 NOTE — Assessment & Plan Note (Addendum)
Annual exam as documented. Counseling done  re healthy lifestyle involving commitment to 150 minutes exercise per week, heart healthy diet, and attaining healthy weight.The importance of adequate sleep also discussed.  Immunization and cancer screening needs are specifically addressed at this visit.  

## 2017-05-14 NOTE — Assessment & Plan Note (Signed)
multipkle pigemented variegated skin lesions refer to dermatolgy for eval

## 2017-05-25 DIAGNOSIS — Z0000 Encounter for general adult medical examination without abnormal findings: Secondary | ICD-10-CM | POA: Insufficient documentation

## 2017-05-25 DIAGNOSIS — Z1211 Encounter for screening for malignant neoplasm of colon: Secondary | ICD-10-CM | POA: Insufficient documentation

## 2017-05-25 NOTE — Assessment & Plan Note (Signed)
Heme negative stool 

## 2017-05-25 NOTE — Progress Notes (Signed)
    Taylor Mitchell     MRN: [id number]      DOB: [birth year]  HPI: Patient is in for annual physical exam. C/o burning with urination,x 1 day, had recently douched before symptoms started  C/o multiple pigmented skin lesions especially on her back where she cannot see them , but they itch, wants to know if they are precancerous or cancerous, has had lesions frozen in the  Past, will refer to dermatology Recent labs,  are reviewed. Immunization is reviewed , and  updated    PE: BP 120/76 (BP Location: Left Arm, Patient Position: Sitting, Cuff Size: Normal)   Pulse 84   Temp 97.6 F (36.4 C) (Other (Comment))   Resp 16   Ht 5\' 6"  (1.676 m)   Wt 184 lb 4 oz (83.6 kg)   SpO2 99%   BMI 29.74 kg/m   Pleasant  female, alert and oriented x 3, in no cardio-pulmonary distress. Afebrile. HEENT No facial trauma or asymetry. Sinuses non tender.  Extra occullar muscles intact, pupils equally reactive to light. External ears normal, tympanic membranes clear. Oropharynx moist, no exudate. Neck: supple, no adenopathy,JVD or thyromegaly.No bruits.  Chest: Clear to ascultation bilaterally.No crackles or wheezes. Non tender to palpation  Breast: No asymetry,no masses or lumps. No tenderness. No nipple discharge or inversion. No axillary or supraclavicular adenopathy  Cardiovascular system; Heart sounds normal,  S1 and  S2 ,no S3.  No murmur, or thrill. Apical beat not displaced Peripheral pulses normal.  Abdomen: Soft, non tender, no organomegaly or masses. No bruits. Bowel sounds normal. No guarding, tenderness or rebound.  Rectal:  Normal sphincter tone. No rectal mass. Guaiac negative stool.  GU: External genitalia normal female genitalia , normal female distribution of hair. No lesions. Urethral meatus normal in size, no  Prolapse, no lesions visibly  Present. Bladder non tender. Vagina pink and moist , with no visible lesions ,physiologic  discharge present . Adequate  pelvic support no  cystocele or rectocele noted Cervix pink and appears healthy, no lesions or ulcerations noted, no discharge noted from os Uterus normal size, no adnexal masses, no cervical motion or adnexal tenderness.   Musculoskeletal exam: Full ROM of spine, hips , shoulders and knees. No deformity ,swelling or crepitus noted. No muscle wasting or atrophy.   Neurologic: Cranial nerves 2 to 12 intact. Power, tone ,sensation and reflexes normal throughout. No disturbance in gait. No tremor.  Skin: Intact, no ulceration, erythema , scaling or rash noted. Pigmentation normal throughout  Psych; Normal mood and affect. Judgement and concentration normal   Assessment & Plan:  Annual physical exam Annual exam as documented. Counseling done  re healthy lifestyle involving commitment to 150 minutes exercise per week, heart healthy diet, and attaining healthy weight.The importance of adequate sleep also discussed.  Immunization and cancer screening needs are specifically addressed at this visit.   Skin lesions, generalized multipkle pigemented variegated skin lesions refer to dermatolgy for eval  Colon cancer screening Heme negative stool

## 2017-05-27 DIAGNOSIS — Z1283 Encounter for screening for malignant neoplasm of skin: Secondary | ICD-10-CM | POA: Diagnosis not present

## 2017-05-27 DIAGNOSIS — Z1211 Encounter for screening for malignant neoplasm of colon: Secondary | ICD-10-CM | POA: Diagnosis not present

## 2017-05-27 DIAGNOSIS — Z23 Encounter for immunization: Secondary | ICD-10-CM | POA: Diagnosis not present

## 2017-05-27 DIAGNOSIS — Z0000 Encounter for general adult medical examination without abnormal findings: Secondary | ICD-10-CM | POA: Diagnosis not present

## 2017-05-28 DIAGNOSIS — H524 Presbyopia: Secondary | ICD-10-CM | POA: Diagnosis not present

## 2017-05-28 DIAGNOSIS — H5203 Hypermetropia, bilateral: Secondary | ICD-10-CM | POA: Diagnosis not present

## 2017-05-28 DIAGNOSIS — H2513 Age-related nuclear cataract, bilateral: Secondary | ICD-10-CM | POA: Diagnosis not present

## 2017-05-28 DIAGNOSIS — H52203 Unspecified astigmatism, bilateral: Secondary | ICD-10-CM | POA: Diagnosis not present

## 2017-05-28 DIAGNOSIS — H25013 Cortical age-related cataract, bilateral: Secondary | ICD-10-CM | POA: Diagnosis not present

## 2017-05-31 NOTE — Patient Instructions (Signed)
Your procedure is scheduled on: 06/04/2017  Report to [facility] at  800   AM.  Call this number if you have problems the morning of surgery: [phone]   Do not eat food or drink liquids :After Midnight.      Take these medicines the morning of surgery with A SIP OF WATER: xanax, claritin.   Do not wear jewelry, make-up or nail polish.  Do not wear lotions, powders, or perfumes. You may wear deodorant.  Do not shave 48 hours prior to surgery.  Do not bring valuables to the hospital.  Contacts, dentures or bridgework may not be worn into surgery.  Leave suitcase in the car. After surgery it may be brought to your room.  For patients admitted to the hospital, checkout time is 11:00 AM the day of discharge.   Patients discharged the day of surgery will not be allowed to drive home.  :     Please read over the following fact sheets that you were given: Coughing and Deep Breathing, Surgical Site Infection Prevention, Anesthesia Post-op Instructions and Care and Recovery After Surgery    Cataract A cataract is a clouding of the lens of the eye. When a lens becomes cloudy, vision is reduced based on the degree and nature of the clouding. Many cataracts reduce vision to some degree. Some cataracts make people more near-sighted as they develop. Other cataracts increase glare. Cataracts that are ignored and become worse can sometimes look white. The white color can be seen through the pupil. CAUSES   Aging. However, cataracts may occur at any age, even in newborns.   Certain drugs.   Trauma to the eye.   Certain diseases such as diabetes.   Specific eye diseases such as chronic inflammation inside the eye or a sudden attack of a rare form of glaucoma.   Inherited or acquired medical problems.  SYMPTOMS   Gradual, progressive drop in vision in the affected eye.   Severe, rapid visual loss. This most often happens when trauma is the cause.  DIAGNOSIS  To detect a cataract, an eye  doctor examines the lens. Cataracts are best diagnosed with an exam of the eyes with the pupils enlarged (dilated) by drops.  TREATMENT  For an early cataract, vision may improve by using different eyeglasses or stronger lighting. If that does not help your vision, surgery is the only effective treatment. A cataract needs to be surgically removed when vision loss interferes with your everyday activities, such as driving, reading, or watching TV. A cataract may also have to be removed if it prevents examination or treatment of another eye problem. Surgery removes the cloudy lens and usually replaces it with a substitute lens (intraocular lens, IOL).  At a time when both you and your doctor agree, the cataract will be surgically removed. If you have cataracts in both eyes, only one is usually removed at a time. This allows the operated eye to heal and be out of danger from any possible problems after surgery (such as infection or poor wound healing). In rare cases, a cataract may be doing damage to your eye. In these cases, your caregiver may advise surgical removal right away. The vast majority of people who have cataract surgery have better vision afterward. HOME CARE INSTRUCTIONS  If you are not planning surgery, you may be asked to do the following:  Use different eyeglasses.   Use stronger or brighter lighting.   Ask your eye doctor about reducing your medicine  dose or changing medicines if it is thought that a medicine caused your cataract. Changing medicines does not make the cataract go away on its own.   Become familiar with your surroundings. Poor vision can lead to injury. Avoid bumping into things on the affected side. You are at a higher risk for tripping or falling.   Exercise extreme care when driving or operating machinery.   Wear sunglasses if you are sensitive to bright light or experiencing problems with glare.  SEEK IMMEDIATE MEDICAL CARE IF:   You have a worsening or sudden  vision loss.   You notice redness, swelling, or increasing pain in the eye.   You have a fever.  Document Released: 07/30/2005 Document Revised: 07/19/2011 Document Reviewed: 03/23/2011 [facility] Patient Information 2012 [place].PATIENT INSTRUCTIONS POST-ANESTHESIA  IMMEDIATELY FOLLOWING SURGERY:  Do not drive or operate machinery for the first twenty four hours after surgery.  Do not make any important decisions for twenty four hours after surgery or while taking narcotic pain medications or sedatives.  If you develop intractable nausea and vomiting or a severe headache please notify your doctor immediately.  FOLLOW-UP:  Please make an appointment with your surgeon as instructed. You do not need to follow up with anesthesia unless specifically instructed to do so.  WOUND CARE INSTRUCTIONS (if applicable):  Keep a dry clean dressing on the anesthesia/puncture wound site if there is drainage.  Once the wound has quit draining you may leave it open to air.  Generally you should leave the bandage intact for twenty four hours unless there is drainage.  If the epidural site drains for more than 36-48 hours please call the anesthesia department.  QUESTIONS?:  Please feel free to call your physician or the hospital operator if you have any questions, and they will be happy to assist you.

## 2017-06-03 ENCOUNTER — Encounter (HOSPITAL_COMMUNITY)
Admission: RE | Admit: 2017-06-03 | Discharge: 2017-06-03 | Disposition: A | Discharge status: Home / Self Care | Payer: Medicare Other | Source: Ambulatory Visit | Attending: Ophthalmology | Admitting: Ophthalmology

## 2017-06-03 ENCOUNTER — Encounter (HOSPITAL_COMMUNITY): Payer: Self-pay

## 2017-06-03 ENCOUNTER — Other Ambulatory Visit: Payer: Self-pay

## 2017-06-03 DIAGNOSIS — E8881 Metabolic syndrome: Secondary | ICD-10-CM | POA: Diagnosis not present

## 2017-06-03 DIAGNOSIS — Z7982 Long term (current) use of aspirin: Secondary | ICD-10-CM | POA: Diagnosis not present

## 2017-06-03 DIAGNOSIS — Z888 Allergy status to other drugs, medicaments and biological substances status: Secondary | ICD-10-CM | POA: Diagnosis not present

## 2017-06-03 DIAGNOSIS — Z79899 Other long term (current) drug therapy: Secondary | ICD-10-CM | POA: Diagnosis not present

## 2017-06-03 DIAGNOSIS — H25012 Cortical age-related cataract, left eye: Secondary | ICD-10-CM | POA: Diagnosis not present

## 2017-06-03 DIAGNOSIS — H2512 Age-related nuclear cataract, left eye: Secondary | ICD-10-CM | POA: Diagnosis not present

## 2017-06-03 DIAGNOSIS — F419 Anxiety disorder, unspecified: Secondary | ICD-10-CM | POA: Diagnosis not present

## 2017-06-03 DIAGNOSIS — Z8673 Personal history of transient ischemic attack (TIA), and cerebral infarction without residual deficits: Secondary | ICD-10-CM | POA: Diagnosis not present

## 2017-06-03 HISTORY — DX: Cerebral infarction, unspecified: I63.9

## 2017-06-03 LAB — BASIC METABOLIC PANEL
ANION GAP: 10 (ref 5–15)
BUN: 13 mg/dL (ref 6–20)
CALCIUM: 9.4 mg/dL (ref 8.9–10.3)
CO2: 25 mmol/L (ref 22–32)
Chloride: 104 mmol/L (ref 101–111)
Creatinine, Ser: 0.53 mg/dL (ref 0.44–1.00)
Glucose, Bld: 101 mg/dL — ABNORMAL HIGH (ref 65–99)
Potassium: 3.7 mmol/L (ref 3.5–5.1)
SODIUM: 139 mmol/L (ref 135–145)

## 2017-06-03 LAB — CBC
HCT: 42 % (ref 36.0–46.0)
Hemoglobin: 13.8 g/dL (ref 12.0–15.0)
MCH: 30.8 pg (ref 26.0–34.0)
MCHC: 32.9 g/dL (ref 30.0–36.0)
MCV: 93.8 fL (ref 78.0–100.0)
PLATELETS: 243 10*3/uL (ref 150–400)
RBC: 4.48 MIL/uL (ref 3.87–5.11)
RDW: 13.3 % (ref 11.5–15.5)
WBC: 8.2 10*3/uL (ref 4.0–10.5)

## 2017-06-04 ENCOUNTER — Encounter (HOSPITAL_COMMUNITY)
Admission: RE | Disposition: A | Discharge status: Home / Self Care | Payer: Self-pay | Source: Ambulatory Visit | Attending: Ophthalmology

## 2017-06-04 ENCOUNTER — Ambulatory Visit (HOSPITAL_COMMUNITY)
Admission: RE | Admit: 2017-06-04 | Discharge: 2017-06-04 | Disposition: A | Discharge status: Home / Self Care | Payer: Medicare Other | Source: Ambulatory Visit | Attending: Ophthalmology | Admitting: Ophthalmology

## 2017-06-04 ENCOUNTER — Ambulatory Visit (HOSPITAL_COMMUNITY): Payer: Medicare Other | Admitting: Anesthesiology

## 2017-06-04 ENCOUNTER — Encounter (HOSPITAL_COMMUNITY): Payer: Self-pay | Admitting: *Deleted

## 2017-06-04 DIAGNOSIS — Z7982 Long term (current) use of aspirin: Secondary | ICD-10-CM | POA: Diagnosis not present

## 2017-06-04 DIAGNOSIS — Z79899 Other long term (current) drug therapy: Secondary | ICD-10-CM | POA: Insufficient documentation

## 2017-06-04 DIAGNOSIS — H2511 Age-related nuclear cataract, right eye: Secondary | ICD-10-CM | POA: Diagnosis not present

## 2017-06-04 DIAGNOSIS — E8881 Metabolic syndrome: Secondary | ICD-10-CM | POA: Insufficient documentation

## 2017-06-04 DIAGNOSIS — H25012 Cortical age-related cataract, left eye: Secondary | ICD-10-CM | POA: Diagnosis not present

## 2017-06-04 DIAGNOSIS — H2512 Age-related nuclear cataract, left eye: Secondary | ICD-10-CM | POA: Insufficient documentation

## 2017-06-04 DIAGNOSIS — H25011 Cortical age-related cataract, right eye: Secondary | ICD-10-CM | POA: Diagnosis not present

## 2017-06-04 DIAGNOSIS — F419 Anxiety disorder, unspecified: Secondary | ICD-10-CM | POA: Insufficient documentation

## 2017-06-04 DIAGNOSIS — Z8673 Personal history of transient ischemic attack (TIA), and cerebral infarction without residual deficits: Secondary | ICD-10-CM | POA: Insufficient documentation

## 2017-06-04 DIAGNOSIS — Z888 Allergy status to other drugs, medicaments and biological substances status: Secondary | ICD-10-CM | POA: Insufficient documentation

## 2017-06-04 HISTORY — PX: CATARACT EXTRACTION W/PHACO: SHX586

## 2017-06-04 SURGERY — PHACOEMULSIFICATION, CATARACT, WITH IOL INSERTION
Anesthesia: Monitor Anesthesia Care | Site: Eye | Laterality: Left

## 2017-06-04 MED ORDER — BSS IO SOLN
INTRAOCULAR | Status: DC | PRN
  Administered 2017-06-04: 15 mL

## 2017-06-04 MED ORDER — TETRACAINE 0.5 % OP SOLN OPTIME - NO CHARGE
OPHTHALMIC | Status: DC | PRN
  Administered 2017-06-04: 2 [drp] via OPHTHALMIC

## 2017-06-04 MED ORDER — KETOROLAC TROMETHAMINE 0.5 % OP SOLN
1.0000 [drp] | OPHTHALMIC | Status: AC
  Administered 2017-06-04 (×3): 1 [drp] via OPHTHALMIC

## 2017-06-04 MED ORDER — ONDANSETRON 4 MG PO TBDP
4.0000 mg | ORAL_TABLET | Freq: Once | ORAL | Status: AC
  Administered 2017-06-04: 4 mg via ORAL
  Filled 2017-06-04: qty 1

## 2017-06-04 MED ORDER — EPINEPHRINE PF 1 MG/ML IJ SOLN
INTRAOCULAR | Status: DC | PRN
  Administered 2017-06-04: 500 mL

## 2017-06-04 MED ORDER — PROVISC 10 MG/ML IO SOLN
INTRAOCULAR | Status: DC | PRN
  Administered 2017-06-04: 0.85 mL via INTRAOCULAR

## 2017-06-04 MED ORDER — TETRACAINE HCL 0.5 % OP SOLN
1.0000 [drp] | OPHTHALMIC | Status: AC
  Administered 2017-06-04 (×3): 1 [drp] via OPHTHALMIC

## 2017-06-04 MED ORDER — MIDAZOLAM HCL 2 MG/2ML IJ SOLN
1.0000 mg | Freq: Once | INTRAMUSCULAR | Status: AC | PRN
  Administered 2017-06-04: 2 mg via INTRAVENOUS
  Filled 2017-06-04: qty 2

## 2017-06-04 MED ORDER — LACTATED RINGERS IV SOLN
INTRAVENOUS | Status: DC
  Administered 2017-06-04: 08:00:00 via INTRAVENOUS

## 2017-06-04 MED ORDER — PHENYLEPHRINE HCL 2.5 % OP SOLN
1.0000 [drp] | OPHTHALMIC | Status: AC
  Administered 2017-06-04 (×3): 1 [drp] via OPHTHALMIC

## 2017-06-04 MED ORDER — CYCLOPENTOLATE-PHENYLEPHRINE 0.2-1 % OP SOLN
1.0000 [drp] | OPHTHALMIC | Status: AC
  Administered 2017-06-04 (×3): 1 [drp] via OPHTHALMIC

## 2017-06-04 SURGICAL SUPPLY — 11 items
CLOTH BEACON ORANGE TIMEOUT ST (SAFETY) ×2 IMPLANT
EYE SHIELD UNIVERSAL CLEAR (GAUZE/BANDAGES/DRESSINGS) ×2 IMPLANT
GLOVE BIOGEL PI IND STRL 6.5 (GLOVE) ×1 IMPLANT
GLOVE BIOGEL PI IND STRL 7.0 (GLOVE) IMPLANT
GLOVE BIOGEL PI INDICATOR 6.5 (GLOVE) ×2
GLOVE BIOGEL PI INDICATOR 7.0 (GLOVE) ×2
LENS ALC ACRYL/TECN (Ophthalmic Related) ×3 IMPLANT
PAD ARMBOARD 7.5X6 YLW CONV (MISCELLANEOUS) ×2 IMPLANT
TAPE SURG TRANSPORE 1 IN (GAUZE/BANDAGES/DRESSINGS) ×1 IMPLANT
TAPE SURGICAL TRANSPORE 1 IN (GAUZE/BANDAGES/DRESSINGS) ×2
WATER STERILE IRR 250ML POUR (IV SOLUTION) ×2 IMPLANT

## 2017-06-04 NOTE — Discharge Instructions (Signed)
°  °          [facility] Instructions °[address]- [place] [address]-[place] °    ° °1. Avoid closing eyes tightly. One often closes the eye tightly when laughing, talking, sneezing, coughing or if they feel irritated. At these times, you should be careful not to close your eyes tightly. ° °2. Instill eye drops as instructed. To instill drops in your eye, open it, look up and have someone gently pull the lower lid down and instill a couple of drops inside the lower lid. ° °3. Do not touch upper lid. ° °4. Take Advil or Tylenol for pain. ° °5. You may use either eye for near work, such as reading or sewing and you may watch television. ° °6. You may have your hair done at the beauty parlor at any time. ° °7. Wear dark glasses with or without your own glasses if you are in bright light. ° °8. Call our office at [phone] or [phone] if you have sharp pain in your eye or unusual symptoms. ° °9.  FOLLOW UP WITH DR. SHAPIRO TODAY IN HIS [facility] OFFICE AT 2:45pm. ° °  °I have received a copy of the above instructions and will follow them.  ° ° ° °IF YOU ARE IN IMMEDIATE DANGER CALL 911! ° °It is important for you to keep your follow-up appointment with your physician after discharge, OR, for you /your caregiver to make a follow-up appointment with your physician / medical provider after discharge. ° °Show these instructions to the next healthcare provider you see. ° °

## 2017-06-04 NOTE — H&P (Signed)
The patient was re examined and there is no change in the patients condition since the original H and P. 

## 2017-06-04 NOTE — Anesthesia Postprocedure Evaluation (Signed)
Anesthesia Post Note  Patient: Taylor Mitchell  Procedure(s) Performed: CATARACT EXTRACTION PHACO AND INTRAOCULAR LENS PLACEMENT (IOC) (Left Eye)  Patient location during evaluation: Short Stay Anesthesia Type: MAC Level of consciousness: awake and alert and oriented Pain management: pain level controlled Vital Signs Assessment: post-procedure vital signs reviewed and stable Respiratory status: spontaneous breathing Cardiovascular status: blood pressure returned to baseline Postop Assessment: no apparent nausea or vomiting and adequate PO intake Anesthetic complications: no     Last Vitals:  Vitals:   06/04/17 0815 06/04/17 0820  BP:    Pulse:    Resp: (!) 21 (!) 39  Temp:    SpO2: 97% 99%    Last Pain:  Vitals:   06/04/17 0806  TempSrc: Oral                 Aleecia Tapia

## 2017-06-04 NOTE — Op Note (Signed)
Patient brought to the operating room and prepped and draped in the usual manner.  Lid speculum inserted in left eye.  Stab incision made at the twelve o'clock position.  Provisc instilled in the anterior chamber.   A 2.4 mm. Stab incision was made temporally.  An anterior capsulotomy was done with a bent 25 gauge needle.  The nucleus was hydrodissected.  The Phaco tip was inserted in the anterior chamber and the nucleus was emulsified.  CDE was 3.54.  The cortical material was then removed with the I and A tip.  Posterior capsule was the polished.  The anterior chamber was deepened with Provisc.  A 24.5 Diopter Alcon AU00T0 IOL was then inserted in the capsular bag.  Provisc was then removed with the I and A tip.  The wound was then hydrated.  Patient sent to the Recovery Room in good condition with follow up in my office.  Preoperative Diagnosis: Cortical and Nuclear Cataract OS Postoperative Diagnosis:  Same Procedure name: Kelman Phacoemulsification OS with IOL

## 2017-06-04 NOTE — Transfer of Care (Signed)
Immediate Anesthesia Transfer of Care Note  Patient: Taylor Mitchell  Procedure(s) Performed: CATARACT EXTRACTION PHACO AND INTRAOCULAR LENS PLACEMENT (IOC) (Left Eye)  Patient Location: Short Stay  Anesthesia Type:MAC  Level of Consciousness: awake and alert   Airway & Oxygen Therapy: Patient Spontanous Breathing  Post-op Assessment: Report given to RN  Post vital signs: Reviewed and stable  Last Vitals:  Vitals:   06/04/17 0815 06/04/17 0820  BP:    Pulse:    Resp: (!) 21 (!) 39  Temp:    SpO2: 97% 99%    Last Pain:  Vitals:   06/04/17 0806  TempSrc: Oral      Patients Stated Pain Goal: 4 (21/58/72 7618)  Complications: No apparent anesthesia complications

## 2017-06-04 NOTE — Anesthesia Preprocedure Evaluation (Signed)
Anesthesia Evaluation  Patient identified by MRN, date of birth, ID band  Airway Mallampati: III  TM Distance: >3 FB Neck ROM: Full    Dental  (+) Poor Dentition   Pulmonary           Cardiovascular  Rhythm:Regular Rate:Normal  2016 essentially nl stress test   Neuro/Psych  Neuromuscular disease CVA (patient states "old" small stroke, no symptoms), No Residual Symptoms    GI/Hepatic   Endo/Other  Well Controlled, Type 2  Renal/GU      Musculoskeletal   Abdominal (+)  Abdomen: soft.    Peds  Hematology   Anesthesia Other Findings   Reproductive/Obstetrics                             Anesthesia Physical Anesthesia Plan  ASA: IV  Anesthesia Plan: MAC   Post-op Pain Management:    Induction:   PONV Risk Score and Plan:   Airway Management Planned: Nasal Cannula  Additional Equipment:   Intra-op Plan:   Post-operative Plan:   Informed Consent: I have reviewed the patients History and Physical, chart, labs and discussed the procedure including the risks, benefits and alternatives for the proposed anesthesia with the patient or authorized representative who has indicated his/her understanding and acceptance.   Dental advisory given  Plan Discussed with: CRNA  Anesthesia Plan Comments:         Anesthesia Quick Evaluation

## 2017-06-05 ENCOUNTER — Encounter (HOSPITAL_COMMUNITY): Payer: Self-pay | Admitting: Ophthalmology

## 2017-06-05 LAB — HEMOGLOBIN A1C
HEMOGLOBIN A1C: 6.3 % — AB (ref 4.8–5.6)
MEAN PLASMA GLUCOSE: 134 mg/dL

## 2017-06-19 ENCOUNTER — Ambulatory Visit: Payer: Self-pay | Admitting: Family Medicine

## 2017-06-20 ENCOUNTER — Encounter (HOSPITAL_COMMUNITY): Payer: Self-pay

## 2017-06-20 ENCOUNTER — Encounter (HOSPITAL_COMMUNITY)
Admission: RE | Admit: 2017-06-20 | Discharge: 2017-06-20 | Disposition: A | Discharge status: Home / Self Care | Payer: Medicare Other | Source: Ambulatory Visit | Attending: Ophthalmology | Admitting: Ophthalmology

## 2017-06-25 ENCOUNTER — Ambulatory Visit (HOSPITAL_COMMUNITY): Payer: Medicare Other | Admitting: Anesthesiology

## 2017-06-25 ENCOUNTER — Encounter (HOSPITAL_COMMUNITY)
Admission: RE | Disposition: A | Discharge status: Home / Self Care | Payer: Self-pay | Source: Ambulatory Visit | Attending: Ophthalmology

## 2017-06-25 ENCOUNTER — Ambulatory Visit (HOSPITAL_COMMUNITY)
Admission: RE | Admit: 2017-06-25 | Discharge: 2017-06-25 | Disposition: A | Discharge status: Home / Self Care | Payer: Medicare Other | Source: Ambulatory Visit | Attending: Ophthalmology | Admitting: Ophthalmology

## 2017-06-25 ENCOUNTER — Encounter (HOSPITAL_COMMUNITY): Payer: Self-pay | Admitting: *Deleted

## 2017-06-25 DIAGNOSIS — G709 Myoneural disorder, unspecified: Secondary | ICD-10-CM | POA: Diagnosis not present

## 2017-06-25 DIAGNOSIS — E8881 Metabolic syndrome: Secondary | ICD-10-CM | POA: Diagnosis not present

## 2017-06-25 DIAGNOSIS — Z7952 Long term (current) use of systemic steroids: Secondary | ICD-10-CM | POA: Insufficient documentation

## 2017-06-25 DIAGNOSIS — Z7982 Long term (current) use of aspirin: Secondary | ICD-10-CM | POA: Diagnosis not present

## 2017-06-25 DIAGNOSIS — E119 Type 2 diabetes mellitus without complications: Secondary | ICD-10-CM | POA: Insufficient documentation

## 2017-06-25 DIAGNOSIS — Z8673 Personal history of transient ischemic attack (TIA), and cerebral infarction without residual deficits: Secondary | ICD-10-CM | POA: Insufficient documentation

## 2017-06-25 DIAGNOSIS — H2511 Age-related nuclear cataract, right eye: Secondary | ICD-10-CM | POA: Diagnosis not present

## 2017-06-25 DIAGNOSIS — Z87891 Personal history of nicotine dependence: Secondary | ICD-10-CM | POA: Insufficient documentation

## 2017-06-25 DIAGNOSIS — Z79899 Other long term (current) drug therapy: Secondary | ICD-10-CM | POA: Diagnosis not present

## 2017-06-25 DIAGNOSIS — F419 Anxiety disorder, unspecified: Secondary | ICD-10-CM | POA: Insufficient documentation

## 2017-06-25 DIAGNOSIS — Z7951 Long term (current) use of inhaled steroids: Secondary | ICD-10-CM | POA: Insufficient documentation

## 2017-06-25 DIAGNOSIS — H25011 Cortical age-related cataract, right eye: Secondary | ICD-10-CM | POA: Diagnosis not present

## 2017-06-25 HISTORY — PX: CATARACT EXTRACTION W/PHACO: SHX586

## 2017-06-25 LAB — GLUCOSE, CAPILLARY: Glucose-Capillary: 112 mg/dL — ABNORMAL HIGH (ref 65–99)

## 2017-06-25 SURGERY — PHACOEMULSIFICATION, CATARACT, WITH IOL INSERTION
Anesthesia: Monitor Anesthesia Care | Site: Eye | Laterality: Right

## 2017-06-25 MED ORDER — MIDAZOLAM HCL 2 MG/2ML IJ SOLN
INTRAMUSCULAR | Status: AC
  Filled 2017-06-25: qty 2

## 2017-06-25 MED ORDER — EPINEPHRINE PF 1 MG/ML IJ SOLN
INTRAOCULAR | Status: DC | PRN
  Administered 2017-06-25: 500 mL

## 2017-06-25 MED ORDER — PHENYLEPHRINE HCL 2.5 % OP SOLN
1.0000 [drp] | OPHTHALMIC | Status: AC
  Administered 2017-06-25 (×3): 1 [drp] via OPHTHALMIC

## 2017-06-25 MED ORDER — TETRACAINE 0.5 % OP SOLN OPTIME - NO CHARGE
OPHTHALMIC | Status: DC | PRN
  Administered 2017-06-25: 2 [drp] via OPHTHALMIC

## 2017-06-25 MED ORDER — MIDAZOLAM HCL 2 MG/2ML IJ SOLN
1.0000 mg | INTRAMUSCULAR | Status: AC
  Administered 2017-06-25: 2 mg via INTRAVENOUS

## 2017-06-25 MED ORDER — KETOROLAC TROMETHAMINE 0.5 % OP SOLN
1.0000 [drp] | OPHTHALMIC | Status: AC
  Administered 2017-06-25 (×3): 1 [drp] via OPHTHALMIC

## 2017-06-25 MED ORDER — FENTANYL CITRATE (PF) 100 MCG/2ML IJ SOLN
INTRAMUSCULAR | Status: AC
  Filled 2017-06-25: qty 2

## 2017-06-25 MED ORDER — LACTATED RINGERS IV SOLN
INTRAVENOUS | Status: DC
  Administered 2017-06-25: 08:00:00 via INTRAVENOUS

## 2017-06-25 MED ORDER — BSS IO SOLN
INTRAOCULAR | Status: DC | PRN
  Administered 2017-06-25: 15 mL

## 2017-06-25 MED ORDER — TETRACAINE HCL 0.5 % OP SOLN
1.0000 [drp] | OPHTHALMIC | Status: AC
  Administered 2017-06-25 (×3): 1 [drp] via OPHTHALMIC

## 2017-06-25 MED ORDER — FENTANYL CITRATE (PF) 100 MCG/2ML IJ SOLN
25.0000 ug | Freq: Once | INTRAMUSCULAR | Status: AC
  Administered 2017-06-25: 25 ug via INTRAVENOUS

## 2017-06-25 MED ORDER — PROVISC 10 MG/ML IO SOLN
INTRAOCULAR | Status: DC | PRN
  Administered 2017-06-25: 0.85 mL via INTRAOCULAR

## 2017-06-25 MED ORDER — CYCLOPENTOLATE-PHENYLEPHRINE 0.2-1 % OP SOLN
1.0000 [drp] | OPHTHALMIC | Status: AC
  Administered 2017-06-25 (×3): 1 [drp] via OPHTHALMIC

## 2017-06-25 SURGICAL SUPPLY — 9 items
CLOTH BEACON ORANGE TIMEOUT ST (SAFETY) ×3 IMPLANT
EYE SHIELD UNIVERSAL CLEAR (GAUZE/BANDAGES/DRESSINGS) ×2 IMPLANT
GLOVE BIOGEL PI IND STRL 7.0 (GLOVE) IMPLANT
GLOVE BIOGEL PI INDICATOR 7.0 (GLOVE) ×2
LENS ALC ACRYL/TECN (Ophthalmic Related) ×3 IMPLANT
PAD ARMBOARD 7.5X6 YLW CONV (MISCELLANEOUS) ×2 IMPLANT
TAPE SURG TRANSPORE 1 IN (GAUZE/BANDAGES/DRESSINGS) IMPLANT
TAPE SURGICAL TRANSPORE 1 IN (GAUZE/BANDAGES/DRESSINGS) ×2
WATER STERILE IRR 250ML POUR (IV SOLUTION) ×2 IMPLANT

## 2017-06-25 NOTE — Discharge Instructions (Addendum)
°  °          [facility] Instructions °[address]- [place] [address]-[place] °    ° °1. Avoid closing eyes tightly. One often closes the eye tightly when laughing, talking, sneezing, coughing or if they feel irritated. At these times, you should be careful not to close your eyes tightly. ° °2. Instill eye drops as instructed. To instill drops in your eye, open it, look up and have someone gently pull the lower lid down and instill a couple of drops inside the lower lid. ° °3. Do not touch upper lid. ° °4. Take Advil or Tylenol for pain. ° °5. You may use either eye for near work, such as reading or sewing and you may watch television. ° °6. You may have your hair done at the beauty parlor at any time. ° °7. Wear dark glasses with or without your own glasses if you are in bright light. ° °8. Call our office at [phone] or [phone] if you have sharp pain in your eye or unusual symptoms. ° °9.  FOLLOW UP WITH DR. SHAPIRO TODAY IN HIS [facility] OFFICE AT 2:45pm. ° °  °I have received a copy of the above instructions and will follow them.  ° ° ° °IF YOU ARE IN IMMEDIATE DANGER CALL 911! ° °It is important for you to keep your follow-up appointment with your physician after discharge, OR, for you /your caregiver to make a follow-up appointment with your physician / medical provider after discharge. ° °Show these instructions to the next healthcare provider you see. ° °

## 2017-06-25 NOTE — Anesthesia Postprocedure Evaluation (Signed)
Anesthesia Post Note  Patient: Taylor Mitchell  Procedure(s) Performed: CATARACT EXTRACTION PHACO AND INTRAOCULAR LENS PLACEMENT (IOC) (Right Eye)  Patient location during evaluation: Short Stay Anesthesia Type: MAC Level of consciousness: awake and alert Pain management: pain level controlled Vital Signs Assessment: post-procedure vital signs reviewed and stable Respiratory status: spontaneous breathing Cardiovascular status: stable Postop Assessment: no apparent nausea or vomiting Anesthetic complications: no     Last Vitals: There were no vitals filed for this visit.  Last Pain:  Vitals:   06/25/17 0800  PainSc: 0-No pain                 Shekina Cordell

## 2017-06-25 NOTE — Op Note (Signed)
Patient brought to the operating room and prepped and draped in the usual manner.  Lid speculum inserted in right eye.  Stab incision made at the twelve o'clock position.  Provisc instilled in the anterior chamber.   A 2.4 mm. Stab incision was made temporally.  An anterior capsulotomy was done with a bent 25 gauge needle.  The nucleus was hydrodissected.  The Phaco tip was inserted in the anterior chamber and the nucleus was emulsified.  CDE was 7.86.  The cortical material was then removed with the I and A tip.  Posterior capsule was the polished.  The anterior chamber was deepened with Provisc.  A 25.0 Diopter Alcon AU00T0 IOL was then inserted in the capsular bag.  Provisc was then removed with the I and A tip.  The wound was then hydrated.  Patient sent to the Recovery Room in good condition with follow up in my office.  Preoperative Diagnosis: Cortical and Nuclear Cataract OD Postoperative Diagnosis:  Same Procedure name: Kelman Phacoemulsification OD with IOL

## 2017-06-25 NOTE — H&P (Signed)
The patient was re examined and there is no change in the patients condition since the original H and P. 

## 2017-06-25 NOTE — Anesthesia Preprocedure Evaluation (Signed)
Anesthesia Evaluation  Patient identified by MRN, date of birth, ID band Patient awake    Reviewed: Allergy & Precautions, NPO status , Patient's Chart, lab work & pertinent test results  Airway Mallampati: III  TM Distance: >3 FB Neck ROM: Full    Dental  (+) Poor Dentition   Pulmonary former smoker,    breath sounds clear to auscultation       Cardiovascular  Rhythm:Regular Rate:Normal  2016 essentially nl stress test   Neuro/Psych PSYCHIATRIC DISORDERS Anxiety  Neuromuscular disease CVA (patient states "old" small stroke, no symptoms), No Residual Symptoms    GI/Hepatic   Endo/Other  diabetes, Well Controlled, Type 2  Renal/GU      Musculoskeletal   Abdominal (+)  Abdomen: soft.    Peds  Hematology   Anesthesia Other Findings   Reproductive/Obstetrics                             Anesthesia Physical Anesthesia Plan  ASA: IV  Anesthesia Plan: MAC   Post-op Pain Management:    Induction:   PONV Risk Score and Plan:   Airway Management Planned: Nasal Cannula  Additional Equipment:   Intra-op Plan:   Post-operative Plan:   Informed Consent: I have reviewed the patients History and Physical, chart, labs and discussed the procedure including the risks, benefits and alternatives for the proposed anesthesia with the patient or authorized representative who has indicated his/her understanding and acceptance.   Dental advisory given  Plan Discussed with: CRNA  Anesthesia Plan Comments:         Anesthesia Quick Evaluation

## 2017-06-25 NOTE — Anesthesia Procedure Notes (Signed)
Procedure Name: MAC Date/Time: 06/25/2017 8:43 AM Performed by: Vista Deck, CRNA Pre-anesthesia Checklist: Patient identified, Emergency Drugs available, Suction available, Timeout performed and Patient being monitored Patient Re-evaluated:Patient Re-evaluated prior to induction Oxygen Delivery Method: Nasal Cannula

## 2017-06-25 NOTE — Transfer of Care (Signed)
Immediate Anesthesia Transfer of Care Note  Patient: Taylor Mitchell  Procedure(s) Performed: CATARACT EXTRACTION PHACO AND INTRAOCULAR LENS PLACEMENT (IOC) (Right Eye)  Patient Location: Short Stay  Anesthesia Type:MAC  Level of Consciousness: awake and alert   Airway & Oxygen Therapy: Patient Spontanous Breathing  Post-op Assessment: Report given to RN and Post -op Vital signs reviewed and stable  Post vital signs: Reviewed and stable  Last Vitals: There were no vitals filed for this visit.  Last Pain:  Vitals:   06/25/17 0800  PainSc: 0-No pain      Patients Stated Pain Goal: 4 (67/54/49 2010)  Complications: No apparent anesthesia complications

## 2017-06-26 ENCOUNTER — Encounter (HOSPITAL_COMMUNITY): Payer: Self-pay | Admitting: Ophthalmology

## 2017-07-29 DIAGNOSIS — M9902 Segmental and somatic dysfunction of thoracic region: Secondary | ICD-10-CM | POA: Diagnosis not present

## 2017-07-29 DIAGNOSIS — M9903 Segmental and somatic dysfunction of lumbar region: Secondary | ICD-10-CM | POA: Diagnosis not present

## 2017-07-29 DIAGNOSIS — M545 Low back pain: Secondary | ICD-10-CM | POA: Diagnosis not present

## 2017-07-29 DIAGNOSIS — M9905 Segmental and somatic dysfunction of pelvic region: Secondary | ICD-10-CM | POA: Diagnosis not present

## 2017-07-29 DIAGNOSIS — M546 Pain in thoracic spine: Secondary | ICD-10-CM | POA: Diagnosis not present

## 2017-07-30 DIAGNOSIS — Z961 Presence of intraocular lens: Secondary | ICD-10-CM | POA: Diagnosis not present

## 2017-08-02 DIAGNOSIS — M9905 Segmental and somatic dysfunction of pelvic region: Secondary | ICD-10-CM | POA: Diagnosis not present

## 2017-08-02 DIAGNOSIS — M9902 Segmental and somatic dysfunction of thoracic region: Secondary | ICD-10-CM | POA: Diagnosis not present

## 2017-08-02 DIAGNOSIS — M545 Low back pain: Secondary | ICD-10-CM | POA: Diagnosis not present

## 2017-08-02 DIAGNOSIS — M546 Pain in thoracic spine: Secondary | ICD-10-CM | POA: Diagnosis not present

## 2017-08-02 DIAGNOSIS — M9903 Segmental and somatic dysfunction of lumbar region: Secondary | ICD-10-CM | POA: Diagnosis not present

## 2017-08-14 ENCOUNTER — Other Ambulatory Visit: Payer: Self-pay | Admitting: Family Medicine

## 2017-08-22 ENCOUNTER — Telehealth: Payer: Self-pay | Admitting: Family Medicine

## 2017-09-17 NOTE — Telephone Encounter (Signed)
close

## 2017-09-19 DIAGNOSIS — M9903 Segmental and somatic dysfunction of lumbar region: Secondary | ICD-10-CM | POA: Diagnosis not present

## 2017-09-19 DIAGNOSIS — M9902 Segmental and somatic dysfunction of thoracic region: Secondary | ICD-10-CM | POA: Diagnosis not present

## 2017-09-19 DIAGNOSIS — M9905 Segmental and somatic dysfunction of pelvic region: Secondary | ICD-10-CM | POA: Diagnosis not present

## 2017-09-19 DIAGNOSIS — M546 Pain in thoracic spine: Secondary | ICD-10-CM | POA: Diagnosis not present

## 2017-09-19 DIAGNOSIS — M545 Low back pain: Secondary | ICD-10-CM | POA: Diagnosis not present

## 2017-09-26 DIAGNOSIS — M545 Low back pain: Secondary | ICD-10-CM | POA: Diagnosis not present

## 2017-09-26 DIAGNOSIS — M9905 Segmental and somatic dysfunction of pelvic region: Secondary | ICD-10-CM | POA: Diagnosis not present

## 2017-09-26 DIAGNOSIS — M9902 Segmental and somatic dysfunction of thoracic region: Secondary | ICD-10-CM | POA: Diagnosis not present

## 2017-09-26 DIAGNOSIS — M546 Pain in thoracic spine: Secondary | ICD-10-CM | POA: Diagnosis not present

## 2017-09-26 DIAGNOSIS — M9903 Segmental and somatic dysfunction of lumbar region: Secondary | ICD-10-CM | POA: Diagnosis not present

## 2017-11-01 DIAGNOSIS — Z961 Presence of intraocular lens: Secondary | ICD-10-CM | POA: Diagnosis not present

## 2017-11-12 ENCOUNTER — Telehealth: Payer: Self-pay | Admitting: Family Medicine

## 2017-11-12 NOTE — Telephone Encounter (Signed)
Patient left message on nurse line asking if it is okay for her to use a topical gel- diclofenac?

## 2017-11-12 NOTE — Telephone Encounter (Signed)
Patient informed of message below, verbalized understanding.  

## 2017-11-12 NOTE — Telephone Encounter (Signed)
Yes this oK

## 2017-11-26 ENCOUNTER — Telehealth: Payer: Self-pay

## 2017-11-26 DIAGNOSIS — E8881 Metabolic syndrome: Secondary | ICD-10-CM

## 2017-11-26 DIAGNOSIS — E559 Vitamin D deficiency, unspecified: Secondary | ICD-10-CM

## 2017-11-26 DIAGNOSIS — E7800 Pure hypercholesterolemia, unspecified: Secondary | ICD-10-CM

## 2017-11-26 DIAGNOSIS — R7303 Prediabetes: Secondary | ICD-10-CM

## 2017-11-26 NOTE — Telephone Encounter (Signed)
Labs ordered for visit

## 2017-11-28 DIAGNOSIS — E7800 Pure hypercholesterolemia, unspecified: Secondary | ICD-10-CM | POA: Diagnosis not present

## 2017-11-28 DIAGNOSIS — E559 Vitamin D deficiency, unspecified: Secondary | ICD-10-CM | POA: Diagnosis not present

## 2017-11-28 DIAGNOSIS — R7303 Prediabetes: Secondary | ICD-10-CM | POA: Diagnosis not present

## 2017-11-29 ENCOUNTER — Other Ambulatory Visit: Payer: Self-pay | Admitting: Family Medicine

## 2017-11-29 DIAGNOSIS — Z1231 Encounter for screening mammogram for malignant neoplasm of breast: Secondary | ICD-10-CM

## 2017-11-29 LAB — CBC
HEMATOCRIT: 38.4 % (ref 35.0–45.0)
Hemoglobin: 13.2 g/dL (ref 11.7–15.5)
MCH: 31.3 pg (ref 27.0–33.0)
MCHC: 34.4 g/dL (ref 32.0–36.0)
MCV: 91 fL (ref 80.0–100.0)
MPV: 10.7 fL (ref 7.5–12.5)
Platelets: 258 10*3/uL (ref 140–400)
RBC: 4.22 10*6/uL (ref 3.80–5.10)
RDW: 12 % (ref 11.0–15.0)
WBC: 6.3 10*3/uL (ref 3.8–10.8)

## 2017-11-29 LAB — COMPLETE METABOLIC PANEL WITH GFR
AG Ratio: 1.7 (calc) (ref 1.0–2.5)
ALBUMIN MSPROF: 4.1 g/dL (ref 3.6–5.1)
ALKALINE PHOSPHATASE (APISO): 62 U/L (ref 33–130)
ALT: 16 U/L (ref 6–29)
AST: 15 U/L (ref 10–35)
BUN: 16 mg/dL (ref 7–25)
CO2: 28 mmol/L (ref 20–32)
CREATININE: 0.64 mg/dL (ref 0.60–0.93)
Calcium: 9.4 mg/dL (ref 8.6–10.4)
Chloride: 105 mmol/L (ref 98–110)
GFR, EST AFRICAN AMERICAN: 101 mL/min/{1.73_m2} (ref >=60)
GFR, EST NON AFRICAN AMERICAN: 87 mL/min/{1.73_m2} (ref >=60)
GLUCOSE: 161 mg/dL — AB (ref 65–99)
Globulin: 2.4 g/dL (calc) (ref 1.9–3.7)
Potassium: 3.9 mmol/L (ref 3.5–5.3)
SODIUM: 141 mmol/L (ref 135–146)
TOTAL PROTEIN: 6.5 g/dL (ref 6.1–8.1)
Total Bilirubin: 0.6 mg/dL (ref 0.2–1.2)

## 2017-11-29 LAB — LIPID PANEL
CHOL/HDL RATIO: 6.5 (calc) — AB (ref <5.0)
Cholesterol: 298 mg/dL — ABNORMAL HIGH (ref <200)
HDL: 46 mg/dL — ABNORMAL LOW (ref >50)
LDL CHOLESTEROL (CALC): 221 mg/dL — AB
NON-HDL CHOLESTEROL (CALC): 252 mg/dL — AB (ref <130)
Triglycerides: 151 mg/dL — ABNORMAL HIGH (ref <150)

## 2017-11-29 LAB — TSH: TSH: 2.97 mIU/L (ref 0.40–4.50)

## 2017-11-29 LAB — VITAMIN D 25 HYDROXY (VIT D DEFICIENCY, FRACTURES): Vit D, 25-Hydroxy: 19 ng/mL — ABNORMAL LOW (ref 30–100)

## 2017-11-29 LAB — HEMOGLOBIN A1C
HEMOGLOBIN A1C: 7.1 %{Hb} — AB (ref <5.7)
MEAN PLASMA GLUCOSE: 157 (calc)
eAG (mmol/L): 8.7 (calc)

## 2017-12-10 ENCOUNTER — Encounter: Payer: Self-pay | Admitting: Family Medicine

## 2017-12-10 ENCOUNTER — Ambulatory Visit (INDEPENDENT_AMBULATORY_CARE_PROVIDER_SITE_OTHER): Payer: Medicare Other | Admitting: Family Medicine

## 2017-12-10 VITALS — BP 122/78 | HR 83 | Resp 16 | Ht 66.0 in | Wt 188.0 lb

## 2017-12-10 DIAGNOSIS — Z1231 Encounter for screening mammogram for malignant neoplasm of breast: Secondary | ICD-10-CM

## 2017-12-10 DIAGNOSIS — R9431 Abnormal electrocardiogram [ECG] [EKG]: Secondary | ICD-10-CM

## 2017-12-10 DIAGNOSIS — N3001 Acute cystitis with hematuria: Secondary | ICD-10-CM | POA: Diagnosis not present

## 2017-12-10 DIAGNOSIS — E669 Obesity, unspecified: Secondary | ICD-10-CM | POA: Diagnosis not present

## 2017-12-10 DIAGNOSIS — E66811 Obesity, class 1: Secondary | ICD-10-CM

## 2017-12-10 DIAGNOSIS — E782 Mixed hyperlipidemia: Secondary | ICD-10-CM

## 2017-12-10 DIAGNOSIS — N3000 Acute cystitis without hematuria: Secondary | ICD-10-CM

## 2017-12-10 DIAGNOSIS — E1169 Type 2 diabetes mellitus with other specified complication: Secondary | ICD-10-CM | POA: Diagnosis not present

## 2017-12-10 DIAGNOSIS — E785 Hyperlipidemia, unspecified: Secondary | ICD-10-CM

## 2017-12-10 LAB — POCT URINALYSIS DIPSTICK
GLUCOSE UA: NEGATIVE
Ketones, UA: 15
Nitrite, UA: POSITIVE
PH UA: 5.5 (ref 5.0–8.0)
PROTEIN UA: NEGATIVE
Spec Grav, UA: >=1.03 — AB (ref 1.010–1.025)
Urobilinogen, UA: 0.2 E.U./dL

## 2017-12-10 MED ORDER — CIPROFLOXACIN HCL 500 MG PO TABS: 500.0000 mg | ORAL_TABLET | Freq: Two times a day (BID) | ORAL | 0 refills | Status: DC

## 2017-12-10 NOTE — Progress Notes (Signed)
Taylor Mitchell     MRN: [id number]      DOB: 1940-09-24   HPI Taylor Mitchell is here for follow up and re-evaluation of chronic medical conditions, medication management and review of a recent labs. States she knows that h er cholesterol is high because she has been eating whatever she wants The PT denies any adverse reactions to current medications since the last visit.  Not exercising much, c/o bilateral knee pain ROS Denies recent fever or chills. Denies sinus pressure, nasal congestion, ear pain or sore throat. Denies chest congestion, productive cough or wheezing. Denies chest pains, palpitations and leg swelling Denies abdominal pain, nausea, vomiting,diarrhea or constipation.   C/o urinary frequency andmalodorous urine. C/o bilateril knee pain and   limitation in mobility. Denies headaches, seizures, numbness, or tingling. Denies depression, anxiety or insomnia. Denies skin break down or rash.   PE  BP 122/78   Pulse 83   Resp 16   Ht 5\' 6"  (1.676 m)   Wt 188 lb (85.3 kg)   SpO2 96%   BMI 30.34 kg/m   Patient alert and oriented and in no cardiopulmonary distress.  HEENT: No facial asymmetry, EOMI,   oropharynx pink and moist.  Neck supple no JVD, no mass.  Chest: Clear to auscultation bilaterally.  CVS: S1, S2 no murmurs, no S3.Regular rate.  ABD: Soft non tender.   Ext: No edema  MS: Adequate ROM spine, shoulders, hips and reduced in knees.  Skin: Intact, no ulcerations or rash noted.  Psych: Good eye contact, normal affect. Memory intact not anxious or depressed appearing.  CNS: CN 2-12 intact, power,  normal throughout.no focal deficits noted.   Assessment & Plan  Type 2 diabetes mellitus with other specified complication ([place]) Taylor Mitchell is reminded of the importance of commitment to daily physical activity for 30 minutes or more, as able and the need to limit carbohydrate intake to 30 to 60 grams per meal to help with blood sugar control.   She is  referred to diabetic educator and will focus on food choice, exercise and weight loss   Taylor Mitchell is reminded of the importance of daily foot exam, annual eye examination, and good blood sugar, blood pressure and cholesterol control.  Diabetic Labs Latest Ref Rng & Units 11/28/2017 06/03/2017 01/16/2017 06/14/2016 01/26/2016  HbA1c <5.7 % of total Hgb 7.1(H) 6.3(H) 6.4(H) 6.2(H) -  Chol <200 mg/dL 298(H) - 252(H) 292(H) 280(H)  HDL >50 mg/dL 46(L) - 52 46 54  Calc LDL mg/dL (calc) 221(H) - 181(H) 219(H) 197(H)  Triglycerides <150 mg/dL 151(H) - 96 133 144  Creatinine 0.60 - 0.93 mg/dL 0.64 0.53 - 0.69 0.63   BP/Weight 12/10/2017 06/25/2017 06/04/2017 06/03/2017 05/14/2017 03/20/2017 2/42/3536  Systolic BP 144 315 400 867 619 509 326  Diastolic BP 78 52 71 69 76 75 70  Wt. (Lbs) 188 182 - 182 184.25 181 181  BMI 30.34 29.38 - 29.38 29.74 29.21 29.21   Foot/eye exam completion dates 12/10/2017  Foot Form Completion Done        Hyperlipidemia Hyperlipidemia:Low fat diet discussed and encouraged.   Lipid Panel  Lab Results  Component Value Date   CHOL 298 (H) 11/28/2017   HDL 46 (L) 11/28/2017   LDLCALC 221 (H) 11/28/2017   TRIG 151 (H) 11/28/2017   CHOLHDL 6.5 (H) 11/28/2017   deteriorated , at high risk for heart disease .  Updated lab needed at/ before next visit.    Obesity (BMI 30.0-34.9)  Deteriorated. Patient re-educated about  the importance of commitment to a  minimum of 150 minutes of exercise per week.  The importance of healthy food choices with portion control discussed. Encouraged to start a food diary, count calories and to consider  joining a support group. Sample diet sheets offered. Goals set by the patient for the next several months.   Weight /BMI 12/10/2017 06/25/2017 06/03/2017  WEIGHT 188 lb 182 lb 182 lb  HEIGHT 5\' 6"  5\' 6"  5\' 6"   BMI 30.34 kg/m2 29.38 kg/m2 29.38 kg/m2      Acute cystitis without hematuria Symptomatic with abn UA, will erx 3 day  course of cipro and follow up Reminded of the need to driink adequate water and void often

## 2017-12-10 NOTE — Assessment & Plan Note (Signed)
Symptomatic with abn UA, will erx 3 day course of cipro and follow up Reminded of the need to driink adequate water and void often

## 2017-12-10 NOTE — Assessment & Plan Note (Signed)
Hyperlipidemia:Low fat diet discussed and encouraged.   Lipid Panel  Lab Results  Component Value Date   CHOL 298 (H) 11/28/2017   HDL 46 (L) 11/28/2017   LDLCALC 221 (H) 11/28/2017   TRIG 151 (H) 11/28/2017   CHOLHDL 6.5 (H) 11/28/2017   deteriorated , at high risk for heart disease .  Updated lab needed at/ before next visit.

## 2017-12-10 NOTE — Patient Instructions (Addendum)
Wellness with nurse is past due  Please schedule   F/U with MD end August  Fasting lipid, cmp and EGFR and hBA1C 1 week prior to August visit  You are referred to diabetic educator, you are now a diabetic . You do not have to start medicine. You will not be testing blood sugars  Need to change eating  Urine to be tested for infection and microalb today   Cholesterol is EXTREMELY high, total and bad cholesterol, egg yolks, cheese, oils and whole milk need to be stopped  You can do this , all the best!

## 2017-12-10 NOTE — Assessment & Plan Note (Signed)
Taylor Mitchell is reminded of the importance of commitment to daily physical activity for 30 minutes or more, as able and the need to limit carbohydrate intake to 30 to 60 grams per meal to help with blood sugar control.   She is referred to diabetic educator and will focus on food choice, exercise and weight loss   Taylor Mitchell is reminded of the importance of daily foot exam, annual eye examination, and good blood sugar, blood pressure and cholesterol control.  Diabetic Labs Latest Ref Rng & Units 11/28/2017 06/03/2017 01/16/2017 06/14/2016 01/26/2016  HbA1c <5.7 % of total Hgb 7.1(H) 6.3(H) 6.4(H) 6.2(H) -  Chol <200 mg/dL 298(H) - 252(H) 292(H) 280(H)  HDL >50 mg/dL 46(L) - 52 46 54  Calc LDL mg/dL (calc) 221(H) - 181(H) 219(H) 197(H)  Triglycerides <150 mg/dL 151(H) - 96 133 144  Creatinine 0.60 - 0.93 mg/dL 0.64 0.53 - 0.69 0.63   BP/Weight 12/10/2017 06/25/2017 06/04/2017 06/03/2017 05/14/2017 03/20/2017 7/94/8016  Systolic BP 553 748 270 786 754 492 010  Diastolic BP 78 52 71 69 76 75 70  Wt. (Lbs) 188 182 - 182 184.25 181 181  BMI 30.34 29.38 - 29.38 29.74 29.21 29.21   Foot/eye exam completion dates 12/10/2017  Foot Form Completion Done

## 2017-12-10 NOTE — Assessment & Plan Note (Signed)
Deteriorated. Patient re-educated about  the importance of commitment to a  minimum of 150 minutes of exercise per week.  The importance of healthy food choices with portion control discussed. Encouraged to start a food diary, count calories and to consider  joining a support group. Sample diet sheets offered. Goals set by the patient for the next several months.   Weight /BMI 12/10/2017 06/25/2017 06/03/2017  WEIGHT 188 lb 182 lb 182 lb  HEIGHT 5\' 6"  5\' 6"  5\' 6"   BMI 30.34 kg/m2 29.38 kg/m2 29.38 kg/m2

## 2017-12-11 ENCOUNTER — Telehealth: Payer: Self-pay

## 2017-12-11 NOTE — Telephone Encounter (Signed)
Patient called in concerned to take the cipro because of the side efects listed. I assured her that it was only for 3 days and dr simpson wouldn't prescribe it if she didn't think it was ok but I advised her to discuss her specific concern of neuropathy with the pharmacist and get his opinion and call back with concerns

## 2017-12-13 ENCOUNTER — Telehealth: Payer: Self-pay

## 2017-12-13 LAB — URINE CULTURE
MICRO NUMBER:: 90526904
SPECIMEN QUALITY:: ADEQUATE

## 2017-12-13 NOTE — Telephone Encounter (Signed)
Patient lvm on nurses line stating "I quit taking them pills my legs and feet were doing all that tingling and my calves." She doesn't know if there is anything you need or can do but she isn't going to take the pills.   I called patient to discuss which "pills" she was referring to. She stated it was Cipro. I let her know Velna Hatchet was out of the office but I would send Velna Hatchet the message and she would get it on Monday. Please call [phone]

## 2017-12-16 ENCOUNTER — Other Ambulatory Visit: Payer: Self-pay | Admitting: Family Medicine

## 2017-12-16 NOTE — Telephone Encounter (Signed)
Nitrofurantoin is sent in its place for 1 week, please let her know

## 2017-12-16 NOTE — Telephone Encounter (Signed)
Patient aware.

## 2017-12-16 NOTE — Telephone Encounter (Signed)
Patient does not want to take cipro because of the side effects of peripheral neuropathy and she already has problems with that and she doesn't want to take it and wants it changed to something else.

## 2017-12-17 ENCOUNTER — Other Ambulatory Visit: Payer: Self-pay

## 2017-12-17 MED ORDER — NITROFURANTOIN MONOHYD MACRO 100 MG PO CAPS: 100.0000 mg | ORAL_CAPSULE | Freq: Two times a day (BID) | ORAL | 0 refills | Status: DC

## 2017-12-24 ENCOUNTER — Ambulatory Visit
Admission: RE | Admit: 2017-12-24 | Discharge: 2017-12-24 | Disposition: A | Discharge status: Home / Self Care | Payer: Medicare Other | Source: Ambulatory Visit | Attending: Family Medicine | Admitting: Family Medicine

## 2017-12-24 DIAGNOSIS — Z1231 Encounter for screening mammogram for malignant neoplasm of breast: Secondary | ICD-10-CM | POA: Diagnosis not present

## 2017-12-25 ENCOUNTER — Other Ambulatory Visit: Payer: Self-pay | Admitting: Family Medicine

## 2017-12-25 DIAGNOSIS — N6320 Unspecified lump in the left breast, unspecified quadrant: Secondary | ICD-10-CM

## 2017-12-25 DIAGNOSIS — R928 Other abnormal and inconclusive findings on diagnostic imaging of breast: Secondary | ICD-10-CM

## 2018-01-07 ENCOUNTER — Telehealth: Payer: Self-pay | Admitting: Family Medicine

## 2018-01-07 NOTE — Addendum Note (Signed)
Addended by: Tula Nakayama E on: 01/07/2018 12:57 PM   Modules accepted: Orders

## 2018-01-07 NOTE — Telephone Encounter (Signed)
, °

## 2018-01-07 NOTE — Telephone Encounter (Signed)
Referral is entered please see if Taylor Mitchell can get appointment date and give this to the patient , I tried to calll her and apologize for late entry, unable to speak with her

## 2018-01-07 NOTE — Telephone Encounter (Signed)
Pt needs a referral to the heart dr --Dr Domenic Polite.

## 2018-01-08 ENCOUNTER — Ambulatory Visit
Admission: RE | Admit: 2018-01-08 | Discharge: 2018-01-08 | Disposition: A | Discharge status: Home / Self Care | Payer: Medicare Other | Source: Ambulatory Visit | Attending: Family Medicine | Admitting: Family Medicine

## 2018-01-08 DIAGNOSIS — R928 Other abnormal and inconclusive findings on diagnostic imaging of breast: Secondary | ICD-10-CM | POA: Insufficient documentation

## 2018-01-08 DIAGNOSIS — N6320 Unspecified lump in the left breast, unspecified quadrant: Secondary | ICD-10-CM

## 2018-01-08 DIAGNOSIS — N6002 Solitary cyst of left breast: Secondary | ICD-10-CM | POA: Diagnosis not present

## 2018-01-08 DIAGNOSIS — R922 Inconclusive mammogram: Secondary | ICD-10-CM | POA: Diagnosis not present

## 2018-01-28 ENCOUNTER — Ambulatory Visit (INDEPENDENT_AMBULATORY_CARE_PROVIDER_SITE_OTHER): Payer: Medicare Other

## 2018-01-28 VITALS — BP 116/60 | HR 78 | Temp 98.4°F | Resp 18 | Ht 66.0 in | Wt 173.1 lb

## 2018-01-28 DIAGNOSIS — Z0000 Encounter for general adult medical examination without abnormal findings: Secondary | ICD-10-CM

## 2018-01-28 NOTE — Progress Notes (Signed)
Subjective:   Taylor Mitchell is a 77 y.o. female who presents for Medicare Annual (Subsequent) preventive examination.  Review of Systems:         Objective:     Vitals: There were no vitals taken for this visit.  There is no height or weight on file to calculate BMI.  Advanced Directives 06/25/2017 06/04/2017 06/03/2017 11/15/2016 02/21/2016  Does Patient Have a Medical Advance Directive? No No No No No  Would patient like information on creating a medical advance directive? No - Patient declined - - No - Patient declined No - patient declined information    Tobacco Social History   Tobacco Use  Smoking Status Former Smoker  . Packs/day: 1.00  . Years: 20.00  . Pack years: 20.00  . Types: Cigarettes  . Start date: 08/14/1955  . Last attempt to quit: 08/14/1983  . Years since quitting: 34.4  Smokeless Tobacco Current User  . Types: Snuff     Ready to quit: Not Answered Counseling given: Not Answered   Clinical Intake:                       Past Medical History:  Diagnosis Date  . Allergic rhinitis   . Anxiety   . Chronic back pain    Seeing chiropractor for almost 28 years, fell off a tractor at age 49  . Diabetes mellitus, type 2 ([facility])    Prediabetic, diet controlled  . Dyspepsia   . Hearing loss   . Hyperlipidemia    Intolerant of all medication  . Peripheral neuropathy   . Stroke ([place]) 2015  . Vitamin D deficiency    Past Surgical History:  Procedure Laterality Date  . CATARACT EXTRACTION W/PHACO Left 06/04/2017   Procedure: CATARACT EXTRACTION PHACO AND INTRAOCULAR LENS PLACEMENT (IOC);  Surgeon: Rutherford Guys, MD;  Location: [facility];  Service: Ophthalmology;  Laterality: Left;  CDE: 3.54  . CATARACT EXTRACTION W/PHACO Right 06/25/2017   Procedure: CATARACT EXTRACTION PHACO AND INTRAOCULAR LENS PLACEMENT (IOC);  Surgeon: Rutherford Guys, MD;  Location: [facility];  Service: Ophthalmology;  Laterality: Right;  CDE: 7.86  . COLONOSCOPY N/A 02/21/2016     Procedure: COLONOSCOPY;  Surgeon: Aviva Signs, MD;  Location: [facility];  Service: Gastroenterology;  Laterality: N/A;  . INGUINAL HERNIA REPAIR Right 2004 approx  . NASAL SEPTUM SURGERY  1967  . OOPHORECTOMY Bilateral   . PARTIAL HYSTERECTOMY    . TONSILLECTOMY     Family History  Problem Relation Age of Onset  . Ovarian cancer Mother   . Kidney disease Brother   . Kidney failure Brother   . Cirrhosis Sister   . Bladder Cancer Father   . Heart attack Sister   . Heart attack Sister   . Heart attack Brother   . Breast cancer Neg Hx    Social History   Socioeconomic History  . Marital status: Married    Spouse name: Not on file  . Number of children: Not on file  . Years of education: Not on file  . Highest education level: Not on file  Occupational History  . Not on file  Social Needs  . Financial resource strain: Not on file  . Food insecurity:    Worry: Not on file    Inability: Not on file  . Transportation needs:    Medical: Not on file    Non-medical: Not on file  Tobacco Use  . Smoking status: Former Smoker  Packs/day: 1.00    Years: 20.00    Pack years: 20.00    Types: Cigarettes    Start date: 08/14/1955    Last attempt to quit: 08/14/1983    Years since quitting: 34.4  . Smokeless tobacco: Current User    Types: Snuff  Substance and Sexual Activity  . Alcohol use: No    Alcohol/week: 0.0 oz  . Drug use: No  . Sexual activity: Never  Lifestyle  . Physical activity:    Days per week: Not on file    Minutes per session: Not on file  . Stress: Not on file  Relationships  . Social connections:    Talks on phone: Not on file    Gets together: Not on file    Attends religious service: Not on file    Active member of club or organization: Not on file    Attends meetings of clubs or organizations: Not on file    Relationship status: Not on file  Other Topics Concern  . Not on file  Social History Narrative  . Not on file    Outpatient  Encounter Medications as of 01/28/2018  Medication Sig  . ALPRAZolam (XANAX) 0.25 MG tablet TAKE 1/2 TABLET BY MOUTH 3 TIMES WEEKLY,AS NEEDED FOR ANXIETY  . aspirin (BAYER ASPIRIN EC LOW DOSE) 81 MG EC tablet Take 81 mg by mouth every other day.   . Bromfenac Sodium (PROLENSA) 0.07 % SOLN Apply 1 drop to eye daily.  . fluticasone (FLONASE) 50 MCG/ACT nasal spray Place 2 sprays into both nostrils daily as needed for allergies.   Marland Kitchen loratadine (CLARITIN) 10 MG tablet Take 10 mg by mouth daily as needed for allergies.   . Misc Natural Products (WHITE WILLOW BARK PO) Take 400 mg by mouth daily as needed (for pain).   . Multiple Vitamins-Minerals (CENTRUM SILVER 50+WOMEN) TABS Take 1 tablet by mouth daily. When she remembers  . nitrofurantoin, macrocrystal-monohydrate, (MACROBID) 100 MG capsule Take 1 capsule (100 mg total) by mouth 2 (two) times daily.  Marland Kitchen ofloxacin (OCUFLOX) 0.3 % ophthalmic solution Place 1 drop into the left eye 2 (two) times daily.  . Omega-3 Fatty Acids (FISH OIL) 1200 MG CAPS Take 2,400 mg by mouth daily.   No facility-administered encounter medications on file as of 01/28/2018.     Activities of Daily Living In your present state of health, do you have any difficulty performing the following activities: 06/03/2017 03/04/2017  Hearing? N N  Vision? N N  Difficulty concentrating or making decisions? N N  Walking or climbing stairs? N N  Dressing or bathing? N N  Doing errands, shopping? N N  Some recent data might be hidden    Patient Care Team: Fayrene Helper, MD as PCP - General Allyn Kenner, MD as Consulting Physician (Dermatology) Satira Sark, MD as Consulting Physician (Cardiology)    Assessment:   This is a routine wellness examination for Taylor Mitchell.  Exercise Activities and Dietary recommendations    Goals    . Exercise 3x per week (30 min per time)     Recommend starting a routine exercise program at least 3 days a week for 30-45 minutes at a time  as tolerated.         Fall Risk Fall Risk  12/10/2017 05/14/2017 03/04/2017 11/15/2016 01/31/2016  Falls in the past year? No No Yes Yes No  Number falls in past yr: - - 1 1 -  Comment - - - - -  Injury with Fall? - - Yes No -  Comment - - - - -  Risk for fall due to : - - - - -  Follow up - - - Falls evaluation completed;Falls prevention discussed -  Comment - - - reveals patient accidentally stumbled -   Is the patient's home free of loose throw rugs in walkways, pet beds, electrical cords, etc?   yes      Grab bars in the bathroom? yes      Handrails on the stairs?   yes      Adequate lighting?   yes  Timed Get Up and Go performed: No  Depression Screen PHQ 2/9 Scores 05/14/2017 11/15/2016 09/19/2015 06/15/2013  PHQ - 2 Score 0 0 0 0     Cognitive Function     6CIT Screen 11/15/2016  What Year? 0 points  What month? 0 points  What time? 0 points  Count back from 20 0 points  Months in reverse 0 points  Repeat phrase 0 points  Total Score 0    Immunization History  Administered Date(s) Administered  . Influenza Split 04/17/2012, 05/14/2013  . Influenza,inj,Quad PF,6+ Mos 05/18/2014, 05/02/2015, 06/19/2016, 05/27/2017  . Pneumococcal Conjugate-13 03/02/2014  . Pneumococcal Polysaccharide-23 09/19/2015  . Td 10/28/2008    Qualifies for Shingles Vaccine?Insurance does not cover this vaccine  Screening Tests Health Maintenance  Topic Date Due  . INFLUENZA VACCINE  03/13/2018  . HEMOGLOBIN A1C  05/30/2018  . TETANUS/TDAP  10/29/2018  . COLONOSCOPY  02/20/2021  . DEXA SCAN  Completed  . PNA vac Low Risk Adult  Completed    Cancer Screenings: Lung: Low Dose CT Chest recommended if Age 16-80 years, 30 pack-year currently smoking OR have quit w/in 15years. Patient does qualify. Breast:  Up to date on Mammogram? Yes   Up to date of Bone Density/Dexa? Yes Colorectal: UTD  Additional Screenings:  Hepatitis C Screening: Patient does not want to be tested     Plan:       I have personally reviewed and noted the following in the patient's chart:   . Medical and social history . Use of alcohol, tobacco or illicit drugs  . Current medications and supplements . Functional ability and status . Nutritional status . Physical activity . Advanced directives . List of other physicians . Hospitalizations, surgeries, and ER visits in previous 12 months . Vitals . Screenings to include cognitive, depression, and falls . Referrals and appointments  In addition, I have reviewed and discussed with patient certain preventive protocols, quality metrics, and best practice recommendations. A written personalized care plan for preventive services as well as general preventive health recommendations were provided to patient.     Tod Persia, [place]  01/28/2018

## 2018-01-28 NOTE — Patient Instructions (Signed)
Taylor Mitchell , Thank you for taking time to come for your Medicare Wellness Visit. I appreciate your ongoing commitment to your health goals. Please review the following plan we discussed and let me know if I can assist you in the future.   Screening recommendations/referrals: Colonoscopy: UTD Mammogram: UTD Bone Density: UTD Recommended yearly ophthalmology/optometry visit for glaucoma screening and checkup Recommended yearly dental visit for hygiene and checkup  Vaccinations: Influenza vaccine: Due Fall 2019 Pneumococcal vaccine: UTD Tdap vaccine: UTD Shingles vaccine: Your insurance doesn't cover this vaccine    Advanced directives: You are not interested in completing this.   Conditions/risks identified: Discussed during your visit.   Next appointment: April 01, 2018 at 9:00 am   Preventive Care 3 Years and Older, Female Preventive care refers to lifestyle choices and visits with your health care provider that can promote health and wellness. What does preventive care include?  A yearly physical exam. This is also called an annual well check.  Dental exams once or twice a year.  Routine eye exams. Ask your health care provider how often you should have your eyes checked.  Personal lifestyle choices, including:  Daily care of your teeth and gums.  Regular physical activity.  Eating a healthy diet.  Avoiding tobacco and drug use.  Limiting alcohol use.  Practicing safe sex.  Taking low-dose aspirin every day.  Taking vitamin and mineral supplements as recommended by your health care provider. What happens during an annual well check? The services and screenings done by your health care provider during your annual well check will depend on your age, overall health, lifestyle risk factors, and family history of disease. Counseling  Your health care provider may ask you questions about your:  Alcohol use.  Tobacco use.  Drug use.  Emotional  well-being.  Home and relationship well-being.  Sexual activity.  Eating habits.  History of falls.  Memory and ability to understand (cognition).  Work and work Statistician.  Reproductive health. Screening  You may have the following tests or measurements:  Height, weight, and BMI.  Blood pressure.  Lipid and cholesterol levels. These may be checked every 5 years, or more frequently if you are over 34 years old.  Skin check.  Lung cancer screening. You may have this screening every year starting at age 41 if you have a 30-pack-year history of smoking and currently smoke or have quit within the past 15 years.  Fecal occult blood test (FOBT) of the stool. You may have this test every year starting at age 70.  Flexible sigmoidoscopy or colonoscopy. You may have a sigmoidoscopy every 5 years or a colonoscopy every 10 years starting at age 49.  Hepatitis C blood test.  Hepatitis B blood test.  Sexually transmitted disease (STD) testing.  Diabetes screening. This is done by checking your blood sugar (glucose) after you have not eaten for a while (fasting). You may have this done every 1-3 years.  Bone density scan. This is done to screen for osteoporosis. You may have this done starting at age 61.  Mammogram. This may be done every 1-2 years. Talk to your health care provider about how often you should have regular mammograms. Talk with your health care provider about your test results, treatment options, and if necessary, the need for more tests. Vaccines  Your health care provider may recommend certain vaccines, such as:  Influenza vaccine. This is recommended every year.  Tetanus, diphtheria, and acellular pertussis (Tdap, Td) vaccine. You may need  a Td booster every 10 years.  Zoster vaccine. You may need this after age 75.  Pneumococcal 13-valent conjugate (PCV13) vaccine. One dose is recommended after age 5.  Pneumococcal polysaccharide (PPSV23) vaccine. One  dose is recommended after age 80. Talk to your health care provider about which screenings and vaccines you need and how often you need them. This information is not intended to replace advice given to you by your health care provider. Make sure you discuss any questions you have with your health care provider. Document Released: 08/26/2015 Document Revised: 04/18/2016 Document Reviewed: 05/31/2015 Elsevier Interactive Patient Education  2017 [place] Prevention in the Home Falls can cause injuries. They can happen to people of all ages. There are many things you can do to make your home safe and to help prevent falls. What can I do on the outside of my home?  Regularly fix the edges of walkways and driveways and fix any cracks.  Remove anything that might make you trip as you walk through a door, such as a raised step or threshold.  Trim any bushes or trees on the path to your home.  Use bright outdoor lighting.  Clear any walking paths of anything that might make someone trip, such as rocks or tools.  Regularly check to see if handrails are loose or broken. Make sure that both sides of any steps have handrails.  Any raised decks and porches should have guardrails on the edges.  Have any leaves, snow, or ice cleared regularly.  Use sand or salt on walking paths during winter.  Clean up any spills in your garage right away. This includes oil or grease spills. What can I do in the bathroom?  Use night lights.  Install grab bars by the toilet and in the tub and shower. Do not use towel bars as grab bars.  Use non-skid mats or decals in the tub or shower.  If you need to sit down in the shower, use a plastic, non-slip stool.  Keep the floor dry. Clean up any water that spills on the floor as soon as it happens.  Remove soap buildup in the tub or shower regularly.  Attach bath mats securely with double-sided non-slip rug tape.  Do not have throw rugs and other  things on the floor that can make you trip. What can I do in the bedroom?  Use night lights.  Make sure that you have a light by your bed that is easy to reach.  Do not use any sheets or blankets that are too big for your bed. They should not hang down onto the floor.  Have a firm chair that has side arms. You can use this for support while you get dressed.  Do not have throw rugs and other things on the floor that can make you trip. What can I do in the kitchen?  Clean up any spills right away.  Avoid walking on wet floors.  Keep items that you use a lot in easy-to-reach places.  If you need to reach something above you, use a strong step stool that has a grab bar.  Keep electrical cords out of the way.  Do not use floor polish or wax that makes floors slippery. If you must use wax, use non-skid floor wax.  Do not have throw rugs and other things on the floor that can make you trip. What can I do with my stairs?  Do not leave any items on the  stairs.  Make sure that there are handrails on both sides of the stairs and use them. Fix handrails that are broken or loose. Make sure that handrails are as long as the stairways.  Check any carpeting to make sure that it is firmly attached to the stairs. Fix any carpet that is loose or worn.  Avoid having throw rugs at the top or bottom of the stairs. If you do have throw rugs, attach them to the floor with carpet tape.  Make sure that you have a light switch at the top of the stairs and the bottom of the stairs. If you do not have them, ask someone to add them for you. What else can I do to help prevent falls?  Wear shoes that:  Do not have high heels.  Have rubber bottoms.  Are comfortable and fit you well.  Are closed at the toe. Do not wear sandals.  If you use a stepladder:  Make sure that it is fully opened. Do not climb a closed stepladder.  Make sure that both sides of the stepladder are locked into place.  Ask  someone to hold it for you, if possible.  Clearly mark and make sure that you can see:  Any grab bars or handrails.  First and last steps.  Where the edge of each step is.  Use tools that help you move around (mobility aids) if they are needed. These include:  Canes.  Walkers.  Scooters.  Crutches.  Turn on the lights when you go into a dark area. Replace any light bulbs as soon as they burn out.  Set up your furniture so you have a clear path. Avoid moving your furniture around.  If any of your floors are uneven, fix them.  If there are any pets around you, be aware of where they are.  Review your medicines with your doctor. Some medicines can make you feel dizzy. This can increase your chance of falling. Ask your doctor what other things that you can do to help prevent falls. This information is not intended to replace advice given to you by your health care provider. Make sure you discuss any questions you have with your health care provider. Document Released: 05/26/2009 Document Revised: 01/05/2016 Document Reviewed: 09/03/2014 Elsevier Interactive Patient Education  2017 [employer].

## 2018-01-30 ENCOUNTER — Encounter: Payer: Self-pay | Admitting: Nutrition

## 2018-01-30 ENCOUNTER — Encounter: Payer: Medicare Other | Attending: Family Medicine | Admitting: Nutrition

## 2018-01-30 VITALS — Ht 66.0 in | Wt 173.0 lb

## 2018-01-30 DIAGNOSIS — E782 Mixed hyperlipidemia: Secondary | ICD-10-CM

## 2018-01-30 DIAGNOSIS — Z6827 Body mass index (BMI) 27.0-27.9, adult: Secondary | ICD-10-CM | POA: Insufficient documentation

## 2018-01-30 DIAGNOSIS — IMO0002 Reserved for concepts with insufficient information to code with codable children: Secondary | ICD-10-CM

## 2018-01-30 DIAGNOSIS — E118 Type 2 diabetes mellitus with unspecified complications: Secondary | ICD-10-CM

## 2018-01-30 DIAGNOSIS — Z713 Dietary counseling and surveillance: Secondary | ICD-10-CM | POA: Diagnosis not present

## 2018-01-30 DIAGNOSIS — E1165 Type 2 diabetes mellitus with hyperglycemia: Secondary | ICD-10-CM

## 2018-01-30 DIAGNOSIS — E1169 Type 2 diabetes mellitus with other specified complication: Secondary | ICD-10-CM | POA: Diagnosis not present

## 2018-01-30 NOTE — Progress Notes (Signed)
Diabetes Self-Management Education  Visit Type: First/Initial  Appt. Start Time: 0800 Appt. End Time: 0900  01/30/2018  [name], identified by name and date of birth, is a 77 y.o. female with a diagnosis of Diabetes: Type 2.  Has lost about 15 lbs since seeing DR. Simpson 2 months ago. Not on any medicaitons. A1C 7.2%. Working on improving BS with diet and exercise and weight loss.. BS are 100-130's in am, less than 150's before bed. Has cut out sweets, snacks, junk food, breads. Cares for her disabled husband. Drinking water.  ASSESSMENT  Height 5\' 6"  (1.676 m), weight 173 lb (78.5 kg). Body mass index is 27.92 kg/m.  Diabetes Self-Management Education - 01/30/18 0802      Visit Information   Visit Type  First/Initial      Initial Visit   Diabetes Type  Type 2    Are you currently following a meal plan?  Yes    What type of meal plan do you follow?  low carb    Are you taking your medications as prescribed?  Not on Medications    Date Diagnosed  2019      Health Coping   How would you rate your overall health?  Good      Psychosocial Assessment   Patient Belief/Attitude about Diabetes  Motivated to manage diabetes    Self-care barriers  None    Self-management support  Doctor's office;Family    Other persons present  Patient    Patient Concerns  Nutrition/Meal planning;Healthy Lifestyle;Weight Control    Special Needs  None    Preferred Learning Style  Auditory    Learning Readiness  Not Ready    How often do you need to have someone help you when you read instructions, pamphlets, or other written materials from your doctor or pharmacy?  1 - Never    What is the last grade level you completed in school?  12      Pre-Education Assessment   Patient understands the diabetes disease and treatment process.  Needs Instruction    Patient understands incorporating nutritional management into lifestyle.  Needs Instruction    Patient undertands incorporating physical  activity into lifestyle.  Needs Instruction    Patient understands using medications safely.  Needs Instruction    Patient understands monitoring blood glucose, interpreting and using results  Needs Instruction    Patient understands prevention, detection, and treatment of acute complications.  Needs Instruction    Patient understands prevention, detection, and treatment of chronic complications.  Needs Instruction    Patient understands how to develop strategies to address psychosocial issues.  Needs Instruction    Patient understands how to develop strategies to promote health/change behavior.  Needs Instruction      Complications   Last HgB A1C per patient/outside source  7.1 %    How often do you check your blood sugar?  0 times/day (not testing)    Fasting Blood glucose range (mg/dL)  70-129    Postprandial Blood glucose range (mg/dL)  70-129    Number of hypoglycemic episodes per month  0    Number of hyperglycemic episodes per week  3    Have you had a dilated eye exam in the past 12 months?  Yes    Have you had a dental exam in the past 12 months?  No    Are you checking your feet?  Yes    How many days per week are you checking your feet?  7      Dietary Intake   Breakfast  2 egg whites, 2 slices [place] bacon,  oatmeal cookie    Lunch  Shrimp sampi and slaw, no noodles, unsweet tea with splenda    Dinner  Chicken salad lettuce and 2 radish and cucumbers, unsweet tea    Snack (evening)  1/2 apple    Beverage(s)  unsweet tea      Exercise   Exercise Type  Light (walking / raking leaves)    How many days per week to you exercise?  30    How many minutes per day do you exercise?  5    Total minutes per week of exercise  150      Patient Education   Disease state   Factors that contribute to the development of diabetes;Explored patient's options for treatment of their diabetes    Nutrition management   Role of diet in the treatment of diabetes and the relationship between the  three main macronutrients and blood glucose level;Meal timing in regards to the patients' current diabetes medication.;Carbohydrate counting;Reviewed blood glucose goals for pre and post meals and how to evaluate the patients' food intake on their blood glucose level.    Physical activity and exercise   Role of exercise on diabetes management, blood pressure control and cardiac health.;Identified with patient nutritional and/or medication changes necessary with exercise.    Medications  Reviewed patients medication for diabetes, action, purpose, timing of dose and side effects.    Monitoring  Taught/evaluated SMBG meter.;Purpose and frequency of SMBG.;Taught/discussed recording of test results and interpretation of SMBG.;Identified appropriate SMBG and/or A1C goals.;Interpreting lab values - A1C, lipid, urine microalbumina.;Daily foot exams    Acute complications  Trained/discussed glucagon administration to patient and designated other.    Chronic complications  Relationship between chronic complications and blood glucose control      Individualized Goals (developed by patient)   Physical Activity  Exercise 5-7 days per week;30 minutes per day    Medications  take my medication as prescribed    Reducing Risk  examine blood glucose patterns      Post-Education Assessment   Patient understands the diabetes disease and treatment process.  Needs Review    Patient understands incorporating nutritional management into lifestyle.  Needs Review    Patient undertands incorporating physical activity into lifestyle.  Needs Review    Patient understands using medications safely.  Needs Review    Patient understands monitoring blood glucose, interpreting and using results  Needs Review    Patient understands prevention, detection, and treatment of acute complications.  Needs Review    Patient understands prevention, detection, and treatment of chronic complications.  Needs Review    Patient understands how  to develop strategies to address psychosocial issues.  Needs Review    Patient understands how to develop strategies to promote health/change behavior.  Needs Review      Outcomes   Expected Outcomes  Demonstrated interest in learning. Expect positive outcomes    Future DMSE  4-6 wks    Program Status  Completed       Individualized Plan for Diabetes Self-Management Training:   Learning Objective:  Patient will have a greater understanding of diabetes self-management. Patient education plan is to attend individual and/or group sessions per assessed needs and concerns.   Plan:   Patient Instructions  Goals 1. Follow My Plate 2. Eat 2-3 carb choices per meal 3. Drink water 4. Increase fresh fruits and vegetables. Continue to  exercise. Don't skip meals. Eat high fiber foods. No fried foods.   Expected Outcomes:  Demonstrated interest in learning. Expect positive outcomes  Education material provided: ADA Diabetes: Your Take Control Guide, A1C conversion sheet, Meal plan card, My Plate and Carbohydrate counting sheet  If problems or questions, patient to contact team via:  Phone and Email  Future DSME appointment: 4-6 wks

## 2018-01-30 NOTE — Patient Instructions (Signed)
Goals 1. Follow My Plate 2. Eat 2-3 carb choices per meal 3. Drink water 4. Increase fresh fruits and vegetables. Continue to exercise. Don't skip meals. Eat high fiber foods. No fried foods.

## 2018-02-10 ENCOUNTER — Encounter: Payer: Self-pay | Admitting: Cardiology

## 2018-02-10 ENCOUNTER — Ambulatory Visit: Payer: Self-pay

## 2018-02-10 NOTE — Progress Notes (Signed)
Cardiology Office Note  Date: 02/11/2018   ID: Taylor, Mitchell 11-Jan-1941, MRN [id number]  PCP: Fayrene Helper, MD  Consulting Cardiologist: Rozann Lesches, MD   Chief Complaint  Patient presents with  . Hyperlipidemia    History of Present Illness: Taylor Mitchell is a 77 y.o. female referred for cardiology consultation by Dr. Moshe Cipro for evaluation of hyperlipidemia.  She has a long-standing history, most likely familial hyperlipidemia.  She has had multiple intolerances to lipid-lowering medications including Zetia, Zocor, Crestor, Lipitor, and Pravachol.  In the past we had discussed referral to the [facility] but she declined. Lipid panel in April showed LDL 221.  States that she is watching her diet, cutting back carbohydrates in the setting of type 2 diabetes mellitus as well.  I saw her in consultation back in 2016 at which time she underwent ischemic testing, low risk Lexiscan Myoview as outlined below.  Not report any angina symptoms.  She does have a long-standing history of intermittent palpitations.  No dizziness or syncope.  I reviewed her lab work from April which is outlined below.  Past Medical History:  Diagnosis Date  . Allergic rhinitis   . Anxiety   . Chronic back pain    Seeing chiropractor for almost 67 years, fell off a tractor at age 72  . Diabetes mellitus, type 2 (HCC)    Prediabetic, diet controlled  . Dyspepsia   . Hearing loss   . Hyperlipidemia    Intolerant of all medication  . Peripheral neuropathy   . Stroke ([place]) 2015  . Vitamin D deficiency     Past Surgical History:  Procedure Laterality Date  . CATARACT EXTRACTION W/PHACO Left 06/04/2017   Procedure: CATARACT EXTRACTION PHACO AND INTRAOCULAR LENS PLACEMENT (IOC);  Surgeon: Rutherford Guys, MD;  Location: [facility];  Service: Ophthalmology;  Laterality: Left;  CDE: 3.54  . CATARACT EXTRACTION W/PHACO Right 06/25/2017   Procedure: CATARACT EXTRACTION PHACO AND INTRAOCULAR LENS  PLACEMENT (IOC);  Surgeon: Rutherford Guys, MD;  Location: [facility];  Service: Ophthalmology;  Laterality: Right;  CDE: 7.86  . COLONOSCOPY N/A 02/21/2016   Procedure: COLONOSCOPY;  Surgeon: Aviva Signs, MD;  Location: [facility];  Service: Gastroenterology;  Laterality: N/A;  . INGUINAL HERNIA REPAIR Right 2004 approx  . NASAL SEPTUM SURGERY  1967  . OOPHORECTOMY Bilateral   . PARTIAL HYSTERECTOMY    . TONSILLECTOMY      Current Outpatient Medications  Medication Sig Dispense Refill  . ALPRAZolam (XANAX) 0.25 MG tablet TAKE 1/2 TABLET BY MOUTH 3 TIMES WEEKLY,AS NEEDED FOR ANXIETY 30 tablet 0  . aspirin (BAYER ASPIRIN EC LOW DOSE) 81 MG EC tablet Take 81 mg by mouth every other day.     . Bromfenac Sodium (PROLENSA) 0.07 % SOLN Apply 1 drop to eye daily.    . fluticasone (FLONASE) 50 MCG/ACT nasal spray Place 2 sprays into both nostrils daily as needed for allergies.     Marland Kitchen loratadine (CLARITIN) 10 MG tablet Take 10 mg by mouth daily as needed for allergies.     . Misc Natural Products (WHITE WILLOW BARK PO) Take 400 mg by mouth daily as needed (for pain).     . Multiple Vitamins-Minerals (CENTRUM SILVER 50+WOMEN) TABS Take 1 tablet by mouth daily. When she remembers    . Omega-3 Fatty Acids (FISH OIL) 1200 MG CAPS Take 2,400 mg by mouth daily.     No current facility-administered medications for this visit.  Allergies:  Statins; Metoprolol; Pseudoephedrine; and Corticosteroids   Social History: The patient  reports that she quit smoking about 34 years ago. Her smoking use included cigarettes. She started smoking about 62 years ago. She has a 20.00 pack-year smoking history. Her smokeless tobacco use includes snuff. She reports that she does not drink alcohol or use drugs.   Family History: The patient's family history includes Bladder Cancer in her father; Cirrhosis in her sister; Heart attack in her brother, sister, and sister; Kidney disease in her brother; Kidney failure in her  brother; Ovarian cancer in her mother.   ROS:  Please see the history of present illness. Otherwise, complete review of systems is positive for none.  All other systems are reviewed and negative.   Physical Exam: VS:  BP 122/72 (BP Location: Right Arm)   Pulse 71   Ht 5\' 6"  (1.676 m)   Wt 171 lb (77.6 kg)   SpO2 94%   BMI 27.60 kg/m , BMI Body mass index is 27.6 kg/m.  Wt Readings from Last 3 Encounters:  02/11/18 171 lb (77.6 kg)  01/30/18 173 lb (78.5 kg)  01/28/18 173 lb 1.3 oz (78.5 kg)    General: Elderly woman, patient appears comfortable at rest. HEENT: Conjunctiva and lids normal, oropharynx clear. Neck: Supple, no elevated JVP or carotid bruits, no thyromegaly. Lungs: Clear to auscultation, nonlabored breathing at rest. Cardiac: Regular rate and rhythm, no S3 or significant systolic murmur, no pericardial rub. Abdomen: Soft, nontender, bowel sounds present. Extremities: No pitting edema, distal pulses 2+. Skin: Warm and dry. Musculoskeletal: No kyphosis. Neuropsychiatric: Alert and oriented x3, affect grossly appropriate.  ECG: I personally reviewed the tracing from 06/03/2017 which showed sinus rhythm with PACs, left anterior fascicular block, small R' in lead V1 and V2 with decreased R wave progression, nonspecific ST changes.  Recent Labwork: 11/28/2017: ALT 16; AST 15; BUN 16; Creat 0.64; Hemoglobin 13.2; Platelets 258; Potassium 3.9; Sodium 141; TSH 2.97     Component Value Date/Time   CHOL 298 (H) 11/28/2017 0733   TRIG 151 (H) 11/28/2017 0733   HDL 46 (L) 11/28/2017 0733   CHOLHDL 6.5 (H) 11/28/2017 0733   VLDL 19 01/16/2017 0738   LDLCALC 221 (H) 11/28/2017 0733    Other Studies Reviewed Today:  Lexiscan Myoview 10/18/2014: IMPRESSION: 1. No reversible ischemia or infarction.  2. Normal left ventricular wall motion.  3. Left ventricular ejection fraction 70%  4. Low-risk stress test findings*.  Assessment and Plan:  1.  Familial  hyperlipidemia, lipid panel from April showed total cholesterol 298 with LDL 221.  These lipid elevations are long-term based on lab review, she also has intolerances to Zetia and statins as outlined above.  We will repeat a fasting lipid panel and check with the [facility] regarding whether she might be able to have coverage for Repatha.  With type 2 diabetes mellitus, her cardiac risk is elevated and warrants further lipid reduction.  2.  Type 2 diabetes mellitus, currently managing by diet per Dr. Moshe Cipro.  Hemoglobin A1c in April was 7.1.  3.  Long-standing history of intermittent palpitations, no dizziness or syncope.  She has no history of defined arrhythmia.  Current medicines were reviewed with the patient today.  Disposition: Follow-up on recommendations from [facility], anticipate 54-month follow-up.  Signed, Satira Sark, MD, [facility] 02/11/2018 9:22 AM    [facility] [address], [place], [place] [address] Phone: [phone]; Fax: [phone])  [phone] 

## 2018-02-11 ENCOUNTER — Telehealth: Payer: Self-pay | Admitting: Cardiology

## 2018-02-11 ENCOUNTER — Ambulatory Visit: Payer: Medicare Other | Admitting: Cardiology

## 2018-02-11 ENCOUNTER — Encounter: Payer: Self-pay | Admitting: Cardiology

## 2018-02-11 VITALS — BP 122/72 | HR 71 | Ht 66.0 in | Wt 171.0 lb

## 2018-02-11 DIAGNOSIS — E1165 Type 2 diabetes mellitus with hyperglycemia: Secondary | ICD-10-CM | POA: Diagnosis not present

## 2018-02-11 DIAGNOSIS — R002 Palpitations: Secondary | ICD-10-CM

## 2018-02-11 DIAGNOSIS — E7849 Other hyperlipidemia: Secondary | ICD-10-CM | POA: Diagnosis not present

## 2018-02-11 MED ORDER — EVOLOCUMAB 140 MG/ML ~~LOC~~ SOAJ: 140.0000 mg | SUBCUTANEOUS | 6 refills | Status: DC

## 2018-02-11 NOTE — Telephone Encounter (Signed)
Patient called back to say she has peripheral neuropathy after she was asked about PAD at apt this am.

## 2018-02-11 NOTE — Addendum Note (Signed)
Addended by: Barbarann Ehlers A on: 02/11/2018 09:33 AM   Modules accepted: Orders

## 2018-02-11 NOTE — Telephone Encounter (Signed)
Patient is asking to speak with nurse in regards to appointment this AM. / tg

## 2018-02-11 NOTE — Patient Instructions (Signed)
Your physician recommends that you schedule a follow-up appointment in: 3 months with Dr.McDowell    Please get FASTING lipids drawn   We plan to prescribe Repatha for your cholesterol. I will message the Pharmacist's at the [facility] office.they will be contacting you   Continue all other medications    No testing ordered today     Thank you for choosing [facility] !

## 2018-02-12 ENCOUNTER — Other Ambulatory Visit (HOSPITAL_COMMUNITY)
Admission: RE | Admit: 2018-02-12 | Discharge: 2018-02-12 | Disposition: A | Discharge status: Home / Self Care | Payer: Medicare Other | Source: Ambulatory Visit | Attending: Cardiology | Admitting: Cardiology

## 2018-02-12 ENCOUNTER — Telehealth: Payer: Self-pay | Admitting: Pharmacist

## 2018-02-12 ENCOUNTER — Other Ambulatory Visit: Payer: Self-pay | Admitting: *Deleted

## 2018-02-12 DIAGNOSIS — E7849 Other hyperlipidemia: Secondary | ICD-10-CM | POA: Diagnosis not present

## 2018-02-12 LAB — LIPID PANEL
CHOL/HDL RATIO: 6.1 ratio
CHOLESTEROL: 263 mg/dL — AB (ref 0–200)
HDL: 43 mg/dL (ref >40)
LDL Cholesterol: 203 mg/dL — ABNORMAL HIGH (ref 0–99)
Triglycerides: 85 mg/dL (ref <150)
VLDL: 17 mg/dL (ref 0–40)

## 2018-02-12 MED ORDER — EVOLOCUMAB 140 MG/ML ~~LOC~~ SOAJ: 140.0000 mg | SUBCUTANEOUS | 11 refills | Status: DC

## 2018-02-12 NOTE — Telephone Encounter (Signed)
-----   Message from Bernita Raisin, RN sent at 02/11/2018  9:18 AM EDT ----- Regarding: needs repatha New pt to Dr Domenic Polite referred for lipid management.He wants her to start [place]. I have asked her to get current lipids. Can you assist with the process ? He said she is unable to take any statin and wants her to go directly to [facility]  thank you   Cathey

## 2018-02-12 NOTE — Telephone Encounter (Signed)
Pt with ASCVD and hx of stroke with baseline LDL 221. She is intolerant to pravastatin 10mg  daily, atorvastatin, simvastatin, rosuvastatin, and ezetimibe 10mg  daily (myalgias). Will submit prior authorization for Repatha.

## 2018-02-12 NOTE — Telephone Encounter (Signed)
PA approved. Rx sent to specialty pharmacy to assess copay.

## 2018-02-18 NOTE — Telephone Encounter (Signed)
Copay is $189 per month. Pt has already had 1 shipment delivered to her home per specialty pharmacy. Please ensure that cost is ok with pt in the long term. If not, she would need to come in to clinic to fill out the [employer] Application form to see if she qualifies for extra assistance. If she is ok with the copay, please have follow up lab work scheduled after at least 3 injections. If she needs help with the injections, please have her scheduled in the pharmacy clinic so that we can assist.

## 2018-02-19 ENCOUNTER — Ambulatory Visit: Payer: Medicare Other | Admitting: *Deleted

## 2018-02-19 DIAGNOSIS — E7849 Other hyperlipidemia: Secondary | ICD-10-CM

## 2018-02-19 NOTE — Progress Notes (Signed)
Pt in office today for Repatha infection teaching. Surclick autoinjector demonstration kit used to show pt sites and how to inject. Pt demonstrated and voiced understanding.

## 2018-02-28 NOTE — Telephone Encounter (Signed)
Spoke with patient and she reports that sugars have been fluctuating since starting the medication. She states that she is unsure if this is from her diet or the medication. She does note she ate some fruit that she usually does not eat. Advised she continue on medication and keep detailed journal so that we can determine if related to diet or medication. She states understanding and will call with any more concerns. Discussed that could try Praluent if she feels it is the medication or could pursue clinical trial. She states understanding.

## 2018-02-28 NOTE — Telephone Encounter (Signed)
Patient very concerned that she read Repatha causes hyperglycemia and she is not sure if she wants to continue to take it.I told her I would ask the pharmacist to call her and discuss

## 2018-03-06 ENCOUNTER — Telehealth: Payer: Self-pay

## 2018-03-06 NOTE — Telephone Encounter (Signed)
Faxed pt assistance form to [employer], pt already has repatha but her cost is around $150/month

## 2018-03-07 ENCOUNTER — Telehealth: Payer: Self-pay | Admitting: Cardiology

## 2018-03-07 NOTE — Telephone Encounter (Signed)
Pt approved for [employer] pt assistance for repatha, faxed pg 4 again to company

## 2018-03-07 NOTE — Telephone Encounter (Signed)
Please call patient regarding  Missing information on Patient Assistance Application.  tg

## 2018-03-20 ENCOUNTER — Telehealth: Payer: Self-pay | Admitting: Cardiology

## 2018-03-20 NOTE — Telephone Encounter (Signed)
Patient would like to speak with nurse regarding medication questions. / tg

## 2018-03-20 NOTE — Telephone Encounter (Signed)
Attempt to reach, line busy x 2-cc

## 2018-03-21 NOTE — Telephone Encounter (Signed)
Patient noted small amount of air in auto injector, states she already spoke with pharmacy and was instructed to flick syringe to remove air.i offered to look at it for her but she feels confident to administer

## 2018-03-26 ENCOUNTER — Encounter: Payer: Self-pay | Admitting: Nutrition

## 2018-03-26 ENCOUNTER — Encounter: Payer: Medicare Other | Attending: Family Medicine | Admitting: Nutrition

## 2018-03-26 VITALS — Ht 66.0 in | Wt 164.0 lb

## 2018-03-26 DIAGNOSIS — Z713 Dietary counseling and surveillance: Secondary | ICD-10-CM | POA: Insufficient documentation

## 2018-03-26 DIAGNOSIS — E118 Type 2 diabetes mellitus with unspecified complications: Secondary | ICD-10-CM

## 2018-03-26 DIAGNOSIS — E785 Hyperlipidemia, unspecified: Secondary | ICD-10-CM | POA: Insufficient documentation

## 2018-03-26 DIAGNOSIS — E1169 Type 2 diabetes mellitus with other specified complication: Secondary | ICD-10-CM | POA: Diagnosis not present

## 2018-03-26 DIAGNOSIS — Z6826 Body mass index (BMI) 26.0-26.9, adult: Secondary | ICD-10-CM | POA: Diagnosis not present

## 2018-03-26 DIAGNOSIS — E1165 Type 2 diabetes mellitus with hyperglycemia: Secondary | ICD-10-CM

## 2018-03-26 DIAGNOSIS — IMO0002 Reserved for concepts with insufficient information to code with codable children: Secondary | ICD-10-CM

## 2018-03-26 NOTE — Progress Notes (Signed)
Diabetes Self-Management Education  Visit Type:  F/u   Appt. Start Time: 0800 Appt. End Time: 0900  03/26/2018  [name], identified by name and date of birth, is a 77 y.o. female with a diagnosis of Diabetes:  .   Has cut out all diet sodas, has cutting. Drinking water and unsweet tea. Recently started on Repatha. Has Hyperlipidemia.  FBS 104  Mg/dl Bedtime 120's. Lost  Total of 18 lbs; 9 lbs in the last 3-4 months. Gets labs done in the next few weeks and will see Dr. Moshe Cipro. Making great progress.  ASSESSMENT  Height 5\' 6"  (1.676 m), weight 164 lb (74.4 kg). Body mass index is 26.47 kg/m.  B) 2 boied eggs, [place] bacon, a plum a washi crackers. ,water L) 1/2 chicken and rice soup, 2 washi crackers and 1/2 sweet potatoe D) Chicken rice soup, 2 eggs and washi cracker, apple, unsweet tea or water   Individualized Plan for Diabetes Self-Management Training:   Learning Objective:  Patient will have a greater understanding of diabetes self-management. Patient education plan is to attend individual and/or group sessions per assessed needs and concerns.   Plan:   Keep up the great job!! Continue high fiber foods Increase carbs 3-4 carb choices Increase  High fiber foods Get A1C to 6.5%   Expected Outcomes:     Education material provided: ADA Diabetes: Your Take Control Guide, A1C conversion sheet, Meal plan card, My Plate and Carbohydrate counting sheet  If problems or questions, patient to contact team via:  Phone and Email  Future DSME appointment:   3 months.

## 2018-03-26 NOTE — Patient Instructions (Signed)
Keep up the great job!! Continue high fiber foods Increase carbs 3-4 carb choices Increase  High fiber foods Get A1C to 6.5%

## 2018-03-28 DIAGNOSIS — E785 Hyperlipidemia, unspecified: Secondary | ICD-10-CM | POA: Diagnosis not present

## 2018-03-28 DIAGNOSIS — E1169 Type 2 diabetes mellitus with other specified complication: Secondary | ICD-10-CM | POA: Diagnosis not present

## 2018-03-29 LAB — LIPID PANEL
Cholesterol: 213 mg/dL — ABNORMAL HIGH (ref <200)
HDL: 38 mg/dL — AB (ref >50)
LDL CHOLESTEROL (CALC): 153 mg/dL — AB
Non-HDL Cholesterol (Calc): 175 mg/dL (calc) — ABNORMAL HIGH (ref <130)
TRIGLYCERIDES: 104 mg/dL (ref <150)
Total CHOL/HDL Ratio: 5.6 (calc) — ABNORMAL HIGH (ref <5.0)

## 2018-03-29 LAB — COMPLETE METABOLIC PANEL WITH GFR
AG RATIO: 1.7 (calc) (ref 1.0–2.5)
ALT: 13 U/L (ref 6–29)
AST: 12 U/L (ref 10–35)
Albumin: 4 g/dL (ref 3.6–5.1)
Alkaline phosphatase (APISO): 58 U/L (ref 33–130)
BILIRUBIN TOTAL: 0.5 mg/dL (ref 0.2–1.2)
BUN: 16 mg/dL (ref 7–25)
CALCIUM: 9.5 mg/dL (ref 8.6–10.4)
CHLORIDE: 108 mmol/L (ref 98–110)
CO2: 29 mmol/L (ref 20–32)
Creat: 0.64 mg/dL (ref 0.60–0.93)
GFR, Est African American: 101 mL/min/{1.73_m2} (ref >=60)
GFR, Est Non African American: 87 mL/min/{1.73_m2} (ref >=60)
Globulin: 2.3 g/dL (calc) (ref 1.9–3.7)
Glucose, Bld: 120 mg/dL — ABNORMAL HIGH (ref 65–99)
POTASSIUM: 4.5 mmol/L (ref 3.5–5.3)
Sodium: 142 mmol/L (ref 135–146)
TOTAL PROTEIN: 6.3 g/dL (ref 6.1–8.1)

## 2018-03-29 LAB — HEMOGLOBIN A1C
HEMOGLOBIN A1C: 6.1 %{Hb} — AB (ref <5.7)
MEAN PLASMA GLUCOSE: 128 (calc)
eAG (mmol/L): 7.1 (calc)

## 2018-04-01 ENCOUNTER — Ambulatory Visit (INDEPENDENT_AMBULATORY_CARE_PROVIDER_SITE_OTHER): Payer: Medicare Other | Admitting: Family Medicine

## 2018-04-01 ENCOUNTER — Encounter: Payer: Self-pay | Admitting: Family Medicine

## 2018-04-01 VITALS — BP 120/78 | HR 62 | Resp 16 | Ht 66.0 in | Wt 164.0 lb

## 2018-04-01 DIAGNOSIS — E1169 Type 2 diabetes mellitus with other specified complication: Secondary | ICD-10-CM | POA: Diagnosis not present

## 2018-04-01 DIAGNOSIS — E663 Overweight: Secondary | ICD-10-CM | POA: Diagnosis not present

## 2018-04-01 DIAGNOSIS — R928 Other abnormal and inconclusive findings on diagnostic imaging of breast: Secondary | ICD-10-CM

## 2018-04-01 DIAGNOSIS — J302 Other seasonal allergic rhinitis: Secondary | ICD-10-CM

## 2018-04-01 DIAGNOSIS — E785 Hyperlipidemia, unspecified: Secondary | ICD-10-CM

## 2018-04-01 NOTE — Assessment & Plan Note (Signed)
Recent flare of symptoms reported, not current

## 2018-04-01 NOTE — Patient Instructions (Addendum)
Physical exam with MD in early December, call if you need me before  We will call you re appointment for left breast US due 11/29 or after  Return any Frdiay am for flu vaccine  CONGRATULATIONS, excellent labs and weight loss, blood sugar nearly normal   hbA1C , chem 7 and EFR non fasting 5 days before MD visit  Take supplemental vitamin D 3 total of 2000 IU every day this is OTC  Thank you  for choosing [facility]. We consider it a privelige to serve you.  Delivering excellent health care in a caring and  compassionate way is our goal.  Partnering with you,  so that together we can achieve this goal is our strategy.

## 2018-04-01 NOTE — Assessment & Plan Note (Signed)
Markedly improved, she is APPLAUDED on this Patient re-educated about  the importance of commitment to a  minimum of 150 minutes of exercise per week.  The importance of healthy food choices with portion control discussed. Encouraged to start a food diary, count calories and to consider  joining a support group. Sample diet sheets offered. Goals set by the patient for the next several months.   Weight /BMI 04/01/2018 03/26/2018 02/11/2018  WEIGHT 164 lb 164 lb 171 lb  HEIGHT 5\' 6"  5\' 6"  5\' 6"   BMI 26.47 kg/m2 26.47 kg/m2 27.6 kg/m2

## 2018-04-01 NOTE — Assessment & Plan Note (Signed)
Improved greatly with dietary change and new start of Repatha by cardiology Hyperlipidemia:Low fat diet discussed and encouraged.   Lipid Panel  Lab Results  Component Value Date   CHOL 213 (H) 03/28/2018   HDL 38 (L) 03/28/2018   LDLCALC 153 (H) 03/28/2018   TRIG 104 03/28/2018   CHOLHDL 5.6 (H) 03/28/2018

## 2018-04-01 NOTE — Progress Notes (Signed)
Taylor Mitchell     MRN: [id number]      DOB: 02-02-1941   HPI Taylor Mitchell is here for follow up and re-evaluation of chronic medical conditions, medication management and review of any available recent lab and radiology data.  Preventive health is updated, specifically  Cancer screening and Immunization.   Questions or concerns regarding consultations or procedures which the PT has had in the interim are  addressed. The PT denies any adverse reactions to current medications since the last visit. She has started parenteral medication for lipid management which she is tolerating very well. She has changed her diet with excellent results with weight loss and markedly improved blood sugar Plans to start walking regularly for exercise states heat is preventing her  There are no new concerns.  There are no specific complaints   ROS Denies recent fever or chills. Denies sinus pressure, nasal congestion, ear pain or sore throat. Denies chest congestion, productive cough or wheezing. Denies chest pains, palpitations and leg swelling Denies abdominal pain, nausea, vomiting,diarrhea or constipation.   Denies dysuria, frequency, hesitancy or incontinence. Denies uncontrolled  joint pain, swelling and limitation in mobility.states knees hurt her less Denies headaches, seizures, numbness, or tingling. Denies depression, anxiety or insomnia. Denies skin break down or rash.   PE  BP 120/78   Pulse 62   Resp 16   Ht 5\' 6"  (1.676 m)   Wt 164 lb (74.4 kg)   SpO2 94%   BMI 26.47 kg/m   Patient alert and oriented and in no cardiopulmonary distress.  HEENT: No facial asymmetry, EOMI,   oropharynx pink and moist.  Neck supple no JVD, no mass.  Chest: Clear to auscultation bilaterally.  CVS: S1, S2 no murmurs, no S3.Regular rate.  ABD: Soft non tender.   Ext: No edema  MS: Adequate ROM spine, shoulders, hips and knees.  Skin: Intact, no ulcerations or rash noted.  Psych: Good eye  contact, normal affect. Memory intact not anxious or depressed appearing.  CNS: CN 2-12 intact, power,  normal throughout.no focal deficits noted.   Assessment & Plan  Type 2 diabetes mellitus with other specified complication (HCC) Marked improvement wiith dietary change and weight loss, she is applauded on this and encouraged to continue same Patient educated about the importance of limiting  Carbohydrate intake , the need to commit to daily physical activity for a minimum of 30 minutes , and to commit weight loss. The fact that changes in all these areas will reduce or eliminate all together the development of diabetes is stressed.  Updated lab needed at/ before next visit.   Diabetic Labs Latest Ref Rng & Units 03/28/2018 02/12/2018 11/28/2017 06/03/2017 01/16/2017  HbA1c <5.7 % of total Hgb 6.1(H) - 7.1(H) 6.3(H) 6.4(H)  Chol <200 mg/dL 213(H) 263(H) 298(H) - 252(H)  HDL >50 mg/dL 38(L) 43 46(L) - 52  Calc LDL mg/dL (calc) 153(H) 203(H) 221(H) - 181(H)  Triglycerides <150 mg/dL 104 85 151(H) - 96  Creatinine 0.60 - 0.93 mg/dL 0.64 - 0.64 0.53 -   BP/Weight 04/01/2018 03/26/2018 02/11/2018 01/30/2018 01/28/2018 12/10/2017 21/19/4174  Systolic BP 081 - 448 - 185 631 497  Diastolic BP 78 - 72 - 60 78 52  Wt. (Lbs) 164 164 171 173 173.08 188 182  BMI 26.47 26.47 27.6 27.92 27.94 30.34 29.38   Foot/eye exam completion dates 12/10/2017  Foot Form Completion Done      Hyperlipidemia associated with type 2 diabetes mellitus ([place]) Improved greatly with  dietary change and new start of Repatha by cardiology Hyperlipidemia:Low fat diet discussed and encouraged.   Lipid Panel  Lab Results  Component Value Date   CHOL 213 (H) 03/28/2018   HDL 38 (L) 03/28/2018   LDLCALC 153 (H) 03/28/2018   TRIG 104 03/28/2018   CHOLHDL 5.6 (H) 03/28/2018       Overweight (BMI 25.0-29.9) Markedly improved, she is APPLAUDED on this Patient re-educated about  the importance of commitment to a  minimum of  150 minutes of exercise per week.  The importance of healthy food choices with portion control discussed. Encouraged to start a food diary, count calories and to consider  joining a support group. Sample diet sheets offered. Goals set by the patient for the next several months.   Weight /BMI 04/01/2018 03/26/2018 02/11/2018  WEIGHT 164 lb 164 lb 171 lb  HEIGHT 5\' 6"  5\' 6"  5\' 6"   BMI 26.47 kg/m2 26.47 kg/m2 27.6 kg/m2      Allergic rhinitis Recent flare of symptoms reported, not current

## 2018-04-01 NOTE — Assessment & Plan Note (Signed)
Marked improvement wiith dietary change and weight loss, she is applauded on this and encouraged to continue same Patient educated about the importance of limiting  Carbohydrate intake , the need to commit to daily physical activity for a minimum of 30 minutes , and to commit weight loss. The fact that changes in all these areas will reduce or eliminate all together the development of diabetes is stressed.  Updated lab needed at/ before next visit.   Diabetic Labs Latest Ref Rng & Units 03/28/2018 02/12/2018 11/28/2017 06/03/2017 01/16/2017  HbA1c <5.7 % of total Hgb 6.1(H) - 7.1(H) 6.3(H) 6.4(H)  Chol <200 mg/dL 213(H) 263(H) 298(H) - 252(H)  HDL >50 mg/dL 38(L) 43 46(L) - 52  Calc LDL mg/dL (calc) 153(H) 203(H) 221(H) - 181(H)  Triglycerides <150 mg/dL 104 85 151(H) - 96  Creatinine 0.60 - 0.93 mg/dL 0.64 - 0.64 0.53 -   BP/Weight 04/01/2018 03/26/2018 02/11/2018 01/30/2018 01/28/2018 12/10/2017 17/91/5056  Systolic BP 979 - 480 - 165 537 482  Diastolic BP 78 - 72 - 60 78 52  Wt. (Lbs) 164 164 171 173 173.08 188 182  BMI 26.47 26.47 27.6 27.92 27.94 30.34 29.38   Foot/eye exam completion dates 12/10/2017  Foot Form Completion Done

## 2018-05-05 DIAGNOSIS — M545 Low back pain: Secondary | ICD-10-CM | POA: Diagnosis not present

## 2018-05-05 DIAGNOSIS — M9902 Segmental and somatic dysfunction of thoracic region: Secondary | ICD-10-CM | POA: Diagnosis not present

## 2018-05-05 DIAGNOSIS — M9905 Segmental and somatic dysfunction of pelvic region: Secondary | ICD-10-CM | POA: Diagnosis not present

## 2018-05-05 DIAGNOSIS — M546 Pain in thoracic spine: Secondary | ICD-10-CM | POA: Diagnosis not present

## 2018-05-05 DIAGNOSIS — M9903 Segmental and somatic dysfunction of lumbar region: Secondary | ICD-10-CM | POA: Diagnosis not present

## 2018-05-14 DIAGNOSIS — M545 Low back pain: Secondary | ICD-10-CM | POA: Diagnosis not present

## 2018-05-14 DIAGNOSIS — M9902 Segmental and somatic dysfunction of thoracic region: Secondary | ICD-10-CM | POA: Diagnosis not present

## 2018-05-14 DIAGNOSIS — M9905 Segmental and somatic dysfunction of pelvic region: Secondary | ICD-10-CM | POA: Diagnosis not present

## 2018-05-14 DIAGNOSIS — M9903 Segmental and somatic dysfunction of lumbar region: Secondary | ICD-10-CM | POA: Diagnosis not present

## 2018-05-14 DIAGNOSIS — M546 Pain in thoracic spine: Secondary | ICD-10-CM | POA: Diagnosis not present

## 2018-05-14 NOTE — Progress Notes (Signed)
Cardiology Office Note  Date: 05/15/2018   ID: Ndeye, Tenorio [birth year], MRN [id number]  PCP: Fayrene Helper, MD  Primary Cardiologist: Rozann Lesches, MD   Chief Complaint  Patient presents with  . Hyperlipidemia     History of Present Illness: Taylor Mitchell is a 77 y.o. female seen in consultation back in July.  She presents for a follow-up visit, overall doing well.  She has been focusing on her diet, has lost weight and also seen a reduction in her hemoglobin A1c.  She has started Repatha since our consultation.  Follow-up lipid panel from August is reviewed below, LDL decreased from 203 down to 153.  She reports no obvious intolerances.  Past Medical History:  Diagnosis Date  . Allergic rhinitis   . Anxiety   . Chronic back pain    Seeing chiropractor for almost 17 years, fell off a tractor at age 35  . Diabetes mellitus, type 2 (HCC)    Prediabetic, diet controlled  . Dyspepsia   . Hearing loss   . Hyperlipidemia    Intolerant of all medication  . Peripheral neuropathy   . Stroke ([place]) 2015  . Vitamin D deficiency     Past Surgical History:  Procedure Laterality Date  . CATARACT EXTRACTION W/PHACO Left 06/04/2017   Procedure: CATARACT EXTRACTION PHACO AND INTRAOCULAR LENS PLACEMENT (IOC);  Surgeon: Rutherford Guys, MD;  Location: [facility];  Service: Ophthalmology;  Laterality: Left;  CDE: 3.54  . CATARACT EXTRACTION W/PHACO Right 06/25/2017   Procedure: CATARACT EXTRACTION PHACO AND INTRAOCULAR LENS PLACEMENT (IOC);  Surgeon: Rutherford Guys, MD;  Location: [facility];  Service: Ophthalmology;  Laterality: Right;  CDE: 7.86  . COLONOSCOPY N/A 02/21/2016   Procedure: COLONOSCOPY;  Surgeon: Aviva Signs, MD;  Location: [facility];  Service: Gastroenterology;  Laterality: N/A;  . INGUINAL HERNIA REPAIR Right 2004 approx  . NASAL SEPTUM SURGERY  1967  . OOPHORECTOMY Bilateral   . PARTIAL HYSTERECTOMY    . TONSILLECTOMY      Current Outpatient Medications    Medication Sig Dispense Refill  . ALPRAZolam (XANAX) 0.25 MG tablet TAKE 1/2 TABLET BY MOUTH 3 TIMES WEEKLY,AS NEEDED FOR ANXIETY 30 tablet 0  . aspirin (BAYER ASPIRIN EC LOW DOSE) 81 MG EC tablet Take 81 mg by mouth every other day.     . Bromfenac Sodium (PROLENSA) 0.07 % SOLN Apply 1 drop to eye daily.    . Evolocumab (REPATHA SURECLICK) 025 MG/ML SOAJ Inject 140 mg into the skin every 14 (fourteen) days. 2 pen 11  . fluticasone (FLONASE) 50 MCG/ACT nasal spray Place 2 sprays into both nostrils daily as needed for allergies.     Marland Kitchen loratadine (CLARITIN) 10 MG tablet Take 10 mg by mouth daily as needed for allergies.     . Misc Natural Products (WHITE WILLOW BARK PO) Take 400 mg by mouth daily as needed (for pain).     . Multiple Vitamins-Minerals (CENTRUM SILVER 50+WOMEN) TABS Take 1 tablet by mouth daily. When she remembers    . Omega-3 Fatty Acids (FISH OIL) 1200 MG CAPS Take 2,400 mg by mouth daily.     No current facility-administered medications for this visit.    Allergies:  Statins; Metoprolol; Pseudoephedrine; and Corticosteroids   Social History: The patient  reports that she quit smoking about 34 years ago. Her smoking use included cigarettes. She started smoking about 62 years ago. She has a 20.00 pack-year smoking history. Her smokeless tobacco use includes  snuff. She reports that she does not drink alcohol or use drugs.   ROS:  Please see the history of present illness. Otherwise, complete review of systems is positive for none.  All other systems are reviewed and negative.   Physical Exam: VS:  BP 116/68   Pulse 62   Ht 5\' 6"  (1.676 m)   Wt 160 lb (72.6 kg)   SpO2 97%   BMI 25.82 kg/m , BMI Body mass index is 25.82 kg/m.  Wt Readings from Last 3 Encounters:  05/15/18 160 lb (72.6 kg)  04/01/18 164 lb (74.4 kg)  03/26/18 164 lb (74.4 kg)    General: Elderly woman, appears comfortable at rest. HEENT: Conjunctiva and lids normal, oropharynx clear. Neck: Supple, no  elevated JVP or carotid bruits, no thyromegaly. Lungs: Clear to auscultation, nonlabored breathing at rest. Cardiac: Regular rate and rhythm, no S3 or significant systolic murmur. Abdomen: Soft, nontender, bowel sounds present. Extremities: No pitting edema, distal pulses 2+. Skin: Warm and dry. Musculoskeletal: No kyphosis. Neuropsychiatric: Alert and oriented x3, affect grossly appropriate.  ECG: I personally reviewed the tracing from 06/03/2017 which showed sinus rhythm with PACs, left anterior fascicular block, decreased R wave progression with small R' in lead V1 and V2, nonspecific ST changes.  Recent Labwork: 11/28/2017: Hemoglobin 13.2; Platelets 258; TSH 2.97 03/28/2018: ALT 13; AST 12; BUN 16; Creat 0.64; Potassium 4.5; Sodium 142     Component Value Date/Time   CHOL 213 (H) 03/28/2018 0805   TRIG 104 03/28/2018 0805   HDL 38 (L) 03/28/2018 0805   CHOLHDL 5.6 (H) 03/28/2018 0805   VLDL 17 02/12/2018 0810   LDLCALC 153 (H) 03/28/2018 0805    Other Studies Reviewed Today:  Lexiscan Myoview 10/18/2014: IMPRESSION: 1. No reversible ischemia or infarction.  2. Normal left ventricular wall motion.  3. Left ventricular ejection fraction 70%  4. Low-risk stress test findings*.  Assessment and Plan:  1.  Familial hyperlipidemia.  She is tolerating Repatha, so far with reduction in LDL from 221 down to 153.  Reinforced dietary measures as well.  Follow-up FLP and LFTs.  2.  Type 2 diabetes mellitus, following with Dr. Moshe Cipro.  Hemoglobin A1c has come down to 6.1% from 7.1%.  He is watching her diet more carefully.  3.  History of palpitations.  She reports no progressive symptoms.  Current medicines were reviewed with the patient today.   Orders Placed This Encounter  Procedures  . Flu Vaccine QUAD 36+ mos IM    Disposition: Follow-up in 6 months.  Signed, Satira Sark, MD, [facility] 05/15/2018 9:28 AM    [facility] [place]. [address] [place], [place] [address] Phone: [phone]; Fax: [phone]

## 2018-05-15 ENCOUNTER — Ambulatory Visit: Payer: Medicare Other | Admitting: Cardiology

## 2018-05-15 ENCOUNTER — Encounter: Payer: Self-pay | Admitting: Cardiology

## 2018-05-15 ENCOUNTER — Other Ambulatory Visit (HOSPITAL_COMMUNITY)
Admission: RE | Admit: 2018-05-15 | Discharge: 2018-05-15 | Disposition: A | Discharge status: Home / Self Care | Payer: Medicare Other | Source: Ambulatory Visit | Attending: Cardiology | Admitting: Cardiology

## 2018-05-15 VITALS — BP 116/68 | HR 62 | Ht 66.0 in | Wt 160.0 lb

## 2018-05-15 DIAGNOSIS — Z23 Encounter for immunization: Secondary | ICD-10-CM

## 2018-05-15 DIAGNOSIS — E7849 Other hyperlipidemia: Secondary | ICD-10-CM | POA: Insufficient documentation

## 2018-05-15 DIAGNOSIS — R002 Palpitations: Secondary | ICD-10-CM | POA: Diagnosis not present

## 2018-05-15 DIAGNOSIS — E1165 Type 2 diabetes mellitus with hyperglycemia: Secondary | ICD-10-CM | POA: Diagnosis not present

## 2018-05-15 LAB — HEPATIC FUNCTION PANEL
ALT: 16 U/L (ref 0–44)
AST: 15 U/L (ref 15–41)
Albumin: 4 g/dL (ref 3.5–5.0)
Alkaline Phosphatase: 58 U/L (ref 38–126)
BILIRUBIN INDIRECT: 0.7 mg/dL (ref 0.3–0.9)
Bilirubin, Direct: 0.1 mg/dL (ref 0.0–0.2)
TOTAL PROTEIN: 7.3 g/dL (ref 6.5–8.1)
Total Bilirubin: 0.8 mg/dL (ref 0.3–1.2)

## 2018-05-15 LAB — LIPID PANEL
CHOLESTEROL: 211 mg/dL — AB (ref 0–200)
HDL: 45 mg/dL (ref >40)
LDL Cholesterol: 145 mg/dL — ABNORMAL HIGH (ref 0–99)
Total CHOL/HDL Ratio: 4.7 RATIO
Triglycerides: 106 mg/dL (ref <150)
VLDL: 21 mg/dL (ref 0–40)

## 2018-05-15 NOTE — Patient Instructions (Signed)
Your physician wants you to follow-up in:  6 months with Dr.McDowell You will receive a reminder letter in the mail two months in advance. If you don't receive a letter, please call our office to schedule the follow-up appointment.    Get lab work today: lipid,lft's   [place] UP IN 6 MONTHS      Your physician recommends that you continue on your current medications as directed. Please refer to the Current Medication list given to you today.     If you need a refill on your cardiac medications before your next appointment, please call your pharmacy.      NO TESTS ORDERED TODAY      Thank you for choosing [facility] !

## 2018-05-22 DIAGNOSIS — M9905 Segmental and somatic dysfunction of pelvic region: Secondary | ICD-10-CM | POA: Diagnosis not present

## 2018-05-22 DIAGNOSIS — M9902 Segmental and somatic dysfunction of thoracic region: Secondary | ICD-10-CM | POA: Diagnosis not present

## 2018-05-22 DIAGNOSIS — M546 Pain in thoracic spine: Secondary | ICD-10-CM | POA: Diagnosis not present

## 2018-05-22 DIAGNOSIS — M9903 Segmental and somatic dysfunction of lumbar region: Secondary | ICD-10-CM | POA: Diagnosis not present

## 2018-05-22 DIAGNOSIS — M545 Low back pain: Secondary | ICD-10-CM | POA: Diagnosis not present

## 2018-06-24 ENCOUNTER — Ambulatory Visit: Payer: Self-pay | Admitting: Nutrition

## 2018-07-02 DIAGNOSIS — M545 Low back pain: Secondary | ICD-10-CM | POA: Diagnosis not present

## 2018-07-02 DIAGNOSIS — M546 Pain in thoracic spine: Secondary | ICD-10-CM | POA: Diagnosis not present

## 2018-07-02 DIAGNOSIS — M9902 Segmental and somatic dysfunction of thoracic region: Secondary | ICD-10-CM | POA: Diagnosis not present

## 2018-07-02 DIAGNOSIS — M9903 Segmental and somatic dysfunction of lumbar region: Secondary | ICD-10-CM | POA: Diagnosis not present

## 2018-07-02 DIAGNOSIS — M9905 Segmental and somatic dysfunction of pelvic region: Secondary | ICD-10-CM | POA: Diagnosis not present

## 2018-07-07 DIAGNOSIS — M9903 Segmental and somatic dysfunction of lumbar region: Secondary | ICD-10-CM | POA: Diagnosis not present

## 2018-07-07 DIAGNOSIS — M546 Pain in thoracic spine: Secondary | ICD-10-CM | POA: Diagnosis not present

## 2018-07-07 DIAGNOSIS — M9902 Segmental and somatic dysfunction of thoracic region: Secondary | ICD-10-CM | POA: Diagnosis not present

## 2018-07-07 DIAGNOSIS — M545 Low back pain: Secondary | ICD-10-CM | POA: Diagnosis not present

## 2018-07-07 DIAGNOSIS — M9905 Segmental and somatic dysfunction of pelvic region: Secondary | ICD-10-CM | POA: Diagnosis not present

## 2018-07-08 ENCOUNTER — Other Ambulatory Visit (HOSPITAL_COMMUNITY): Payer: Self-pay

## 2018-07-14 ENCOUNTER — Ambulatory Visit
Admission: RE | Admit: 2018-07-14 | Discharge: 2018-07-14 | Disposition: A | Discharge status: Home / Self Care | Payer: Medicare Other | Source: Ambulatory Visit | Attending: Family Medicine | Admitting: Family Medicine

## 2018-07-14 DIAGNOSIS — R928 Other abnormal and inconclusive findings on diagnostic imaging of breast: Secondary | ICD-10-CM | POA: Diagnosis not present

## 2018-07-14 DIAGNOSIS — N6322 Unspecified lump in the left breast, upper inner quadrant: Secondary | ICD-10-CM | POA: Diagnosis not present

## 2018-07-21 ENCOUNTER — Encounter: Payer: Self-pay | Admitting: Family Medicine

## 2018-07-28 ENCOUNTER — Telehealth: Payer: Self-pay

## 2018-07-28 DIAGNOSIS — L308 Other specified dermatitis: Secondary | ICD-10-CM | POA: Diagnosis not present

## 2018-07-28 DIAGNOSIS — D225 Melanocytic nevi of trunk: Secondary | ICD-10-CM | POA: Diagnosis not present

## 2018-07-28 DIAGNOSIS — L821 Other seborrheic keratosis: Secondary | ICD-10-CM | POA: Diagnosis not present

## 2018-07-28 NOTE — Telephone Encounter (Signed)
Repatha approved through 08/03/2019, letter sent to scan

## 2018-08-11 ENCOUNTER — Encounter: Payer: Self-pay | Admitting: Family Medicine

## 2018-08-26 DIAGNOSIS — M545 Low back pain: Secondary | ICD-10-CM | POA: Diagnosis not present

## 2018-08-26 DIAGNOSIS — M546 Pain in thoracic spine: Secondary | ICD-10-CM | POA: Diagnosis not present

## 2018-08-26 DIAGNOSIS — M9905 Segmental and somatic dysfunction of pelvic region: Secondary | ICD-10-CM | POA: Diagnosis not present

## 2018-08-26 DIAGNOSIS — M9903 Segmental and somatic dysfunction of lumbar region: Secondary | ICD-10-CM | POA: Diagnosis not present

## 2018-08-26 DIAGNOSIS — M9902 Segmental and somatic dysfunction of thoracic region: Secondary | ICD-10-CM | POA: Diagnosis not present

## 2018-09-02 ENCOUNTER — Encounter: Payer: Self-pay | Admitting: Family Medicine

## 2018-09-02 ENCOUNTER — Ambulatory Visit (INDEPENDENT_AMBULATORY_CARE_PROVIDER_SITE_OTHER): Payer: Medicare Other | Admitting: Family Medicine

## 2018-09-02 VITALS — BP 119/70 | HR 87 | Resp 12 | Ht 66.0 in | Wt 163.0 lb

## 2018-09-02 DIAGNOSIS — M25562 Pain in left knee: Secondary | ICD-10-CM

## 2018-09-02 DIAGNOSIS — E785 Hyperlipidemia, unspecified: Secondary | ICD-10-CM

## 2018-09-02 DIAGNOSIS — E1169 Type 2 diabetes mellitus with other specified complication: Secondary | ICD-10-CM

## 2018-09-02 DIAGNOSIS — Z1231 Encounter for screening mammogram for malignant neoplasm of breast: Secondary | ICD-10-CM

## 2018-09-02 DIAGNOSIS — M25462 Effusion, left knee: Secondary | ICD-10-CM

## 2018-09-02 DIAGNOSIS — Z0000 Encounter for general adult medical examination without abnormal findings: Secondary | ICD-10-CM

## 2018-09-02 DIAGNOSIS — N6320 Unspecified lump in the left breast, unspecified quadrant: Secondary | ICD-10-CM

## 2018-09-02 HISTORY — DX: Pain in left knee: M25.562

## 2018-09-02 HISTORY — DX: Effusion, left knee: M25.462

## 2018-09-02 MED ORDER — MOMETASONE FUROATE 0.1 % EX SOLN: CUTANEOUS | 1 refills | Status: DC

## 2018-09-02 MED ORDER — MECLIZINE HCL 12.5 MG PO TABS: 12.5000 mg | ORAL_TABLET | Freq: Three times a day (TID) | ORAL | 0 refills | Status: DC | PRN

## 2018-09-02 NOTE — Assessment & Plan Note (Addendum)
3 week history, limits mobility with some instability, refer ortho

## 2018-09-02 NOTE — Patient Instructions (Addendum)
Wellness with Nurse due June 20 or after  MD f/u in end August   Please schedule mammograms and [place] for June 2 or after in [place]  Medications are sent in for itchy ear and dizziness  You are referred to Dr Luna Glasgow  re left knee pain and swelling with instability  Labs today, hBA1C, cmp and eGFR  It is important that you exercise regularly at least 30 minutes 5 times a week. If you develop chest pain, have severe difficulty breathing, or feel very tired, stop exercising immediately and seek medical attention  Thank you  for choosing [facility]. We consider it a privelige to serve you.  Delivering excellent health care in a caring and  compassionate way is our goal.  Partnering with you,  so that together we can achieve this goal is our strategy.

## 2018-09-03 ENCOUNTER — Telehealth: Payer: Self-pay

## 2018-09-03 DIAGNOSIS — E1169 Type 2 diabetes mellitus with other specified complication: Secondary | ICD-10-CM

## 2018-09-03 DIAGNOSIS — E8881 Metabolic syndrome: Secondary | ICD-10-CM

## 2018-09-03 DIAGNOSIS — E663 Overweight: Secondary | ICD-10-CM

## 2018-09-03 LAB — COMPLETE METABOLIC PANEL WITH GFR
AG Ratio: 1.7 (calc) (ref 1.0–2.5)
ALBUMIN MSPROF: 4.4 g/dL (ref 3.6–5.1)
ALKALINE PHOSPHATASE (APISO): 55 U/L (ref 33–130)
ALT: 20 U/L (ref 6–29)
AST: 14 U/L (ref 10–35)
BILIRUBIN TOTAL: 0.4 mg/dL (ref 0.2–1.2)
BUN: 19 mg/dL (ref 7–25)
CHLORIDE: 104 mmol/L (ref 98–110)
CO2: 28 mmol/L (ref 20–32)
CREATININE: 0.66 mg/dL (ref 0.60–0.93)
Calcium: 9.8 mg/dL (ref 8.6–10.4)
GFR, Est African American: 100 mL/min/{1.73_m2} (ref >=60)
GFR, Est Non African American: 86 mL/min/{1.73_m2} (ref >=60)
GLUCOSE: 98 mg/dL (ref 65–139)
Globulin: 2.6 g/dL (calc) (ref 1.9–3.7)
Potassium: 4.5 mmol/L (ref 3.5–5.3)
Sodium: 141 mmol/L (ref 135–146)
Total Protein: 7 g/dL (ref 6.1–8.1)

## 2018-09-03 LAB — HEMOGLOBIN A1C
EAG (MMOL/L): 7.6 (calc)
HEMOGLOBIN A1C: 6.4 %{Hb} — AB (ref <5.7)
Mean Plasma Glucose: 137 (calc)

## 2018-09-03 NOTE — Telephone Encounter (Signed)
Spoke with patient and advised of recent lab results with verbal understanding. Labs ordered and mailed to be drawn prior to next office visit. Patient aware.

## 2018-09-06 ENCOUNTER — Encounter: Payer: Self-pay | Admitting: Family Medicine

## 2018-09-06 NOTE — Progress Notes (Signed)
Bari Leib Mull     MRN: [id number]      DOB: 1941/04/28  HPI: Patient is in for annual physical exam. C/o increased and uncontrolled left knee pain with instability.x 3 weeks C/o intermittent itching in ears, responds to topical steroid, and intermittent vertigo, responds to meclizine, requests medications be sent though not currently symptomatic Recent labs, if available are reviewed. Immunization is reviewed    PE: BP 119/70   Pulse 87   Resp 12   Ht 5\' 6"  (1.676 m)   Wt 163 lb 0.6 oz (74 kg)   SpO2 96% Comment: room air  BMI 26.32 kg/m   Pleasant  female, alert and oriented x 3, in no cardio-pulmonary distress. Afebrile. HEENT No facial trauma or asymetry. Sinuses non tender.  Extra occullar muscles intact, pupils equally reactive to light. External ears normal, tympanic membranes clear.External ear canals are healthy Oropharynx moist, no exudate. Neck: supple, no adenopathy,JVD or thyromegaly.No bruits.  Chest: Clear to ascultation bilaterally.No crackles or wheezes. Non tender to palpation  Breast: No asymetry,no masses or lumps. No tenderness. No nipple discharge or inversion. No axillary or supraclavicular adenopathy  Cardiovascular system; Heart sounds normal,  S1 and  S2 ,no S3.  No murmur, or thrill. Apical beat not displaced Peripheral pulses normal.  Abdomen: Soft, non tender, no organomegaly or masses. No bruits. Bowel sounds normal. No guarding, tenderness or rebound.  .   Musculoskeletal exam: Full ROM of spine, hips , shoulders and reduced in left  knee. No deformity ,swelling or crepitus noted. No muscle wasting or atrophy.   Neurologic: Cranial nerves 2 to 12 intact. Power, tone ,sensation and reflexes normal throughout. No disturbance in gait. No tremor.  Skin: Intact, no ulceration, erythema , scaling or rash noted. Pigmentation normal throughout  Psych; Normal mood and affect. Judgement and concentration normal   Assessment  & Plan:  Pain and swelling of knee, left 3 week history, limits mobility with some instability, refer ortho  Annual physical exam Annual exam as documented. Counseling done  re healthy lifestyle involving commitment to 150 minutes exercise per week, heart healthy diet, and attaining healthy weight.The importance of adequate sleep also discussed.  Changes in health habits are decided on by the patient with goals and time frames  set for achieving them. Immunization and cancer screening needs are specifically addressed at this visit.   Type 2 diabetes mellitus with other specified complication ([place]) Ms. Bean is reminded of the importance of commitment to daily physical activity for 30 minutes or more, as able and the need to limit carbohydrate intake to 30 to 60 grams per meal to help with blood sugar control.  Remains diet controlled , neds to reduce starch and sugar intake  Ms. Zarr is reminded of the importance of daily foot exam, annual eye examination, and good blood sugar, blood pressure and cholesterol control.  Diabetic Labs Latest Ref Rng & Units 09/02/2018 05/15/2018 03/28/2018 02/12/2018 11/28/2017  HbA1c <5.7 % of total Hgb 6.4(H) - 6.1(H) - 7.1(H)  Chol 0 - 200 mg/dL - 211(H) 213(H) 263(H) 298(H)  HDL >40 mg/dL - 45 38(L) 43 46(L)  Calc LDL 0 - 99 mg/dL - 145(H) 153(H) 203(H) 221(H)  Triglycerides <150 mg/dL - 106 104 85 151(H)  Creatinine 0.60 - 0.93 mg/dL 0.66 - 0.64 - 0.64   BP/Weight 09/02/2018 05/15/2018 04/01/2018 03/26/2018 02/11/2018 01/30/2018 0/26/3785  Systolic BP 885 027 741 - 287 - 867  Diastolic BP 70 68 78 - 72 -  60  Wt. (Lbs) 163.04 160 164 164 171 173 173.08  BMI 26.32 25.82 26.47 26.47 27.6 27.92 27.94   Foot/eye exam completion dates 12/10/2017  Foot Form Completion Done

## 2018-09-06 NOTE — Assessment & Plan Note (Addendum)
[name] is reminded of the importance of commitment to daily physical activity for 30 minutes or more, as able and the need to limit carbohydrate intake to 30 to 60 grams per meal to help with blood sugar control.  Remains diet controlled , neds to reduce starch and sugar intake  [name] is reminded of the importance of daily foot exam, annual eye examination, and good blood sugar, blood pressure and cholesterol control.  Diabetic Labs Latest Ref Rng & Units 09/02/2018 05/15/2018 03/28/2018 02/12/2018 11/28/2017  HbA1c <5.7 % of total Hgb 6.4(H) - 6.1(H) - 7.1(H)  Chol 0 - 200 mg/dL - 211(H) 213(H) 263(H) 298(H)  HDL >40 mg/dL - 45 38(L) 43 46(L)  Calc LDL 0 - 99 mg/dL - 145(H) 153(H) 203(H) 221(H)  Triglycerides <150 mg/dL - 106 104 85 151(H)  Creatinine 0.60 - 0.93 mg/dL 0.66 - 0.64 - 0.64   BP/Weight 09/02/2018 05/15/2018 04/01/2018 03/26/2018 02/11/2018 01/30/2018 2/95/6213  Systolic BP 086 578 469 - 629 - 528  Diastolic BP 70 68 78 - 72 - 60  Wt. (Lbs) 163.04 160 164 164 171 173 173.08  BMI 26.32 25.82 26.47 26.47 27.6 27.92 27.94   Foot/eye exam completion dates 12/10/2017  Foot Form Completion Done

## 2018-09-06 NOTE — Assessment & Plan Note (Signed)
Annual exam as documented. Counseling done  re healthy lifestyle involving commitment to 150 minutes exercise per week, heart healthy diet, and attaining healthy weight.The importance of adequate sleep also discussed. Changes in health habits are decided on by the patient with goals and time frames  set for achieving them. Immunization and cancer screening needs are specifically addressed at this visit. 

## 2018-09-16 ENCOUNTER — Ambulatory Visit (INDEPENDENT_AMBULATORY_CARE_PROVIDER_SITE_OTHER): Payer: Medicare Other

## 2018-09-16 ENCOUNTER — Encounter: Payer: Self-pay | Admitting: Radiology

## 2018-09-16 ENCOUNTER — Ambulatory Visit: Payer: Medicare Other | Admitting: Orthopaedic Surgery

## 2018-09-16 ENCOUNTER — Encounter: Payer: Self-pay | Admitting: Orthopaedic Surgery

## 2018-09-16 VITALS — BP 119/72 | HR 62 | Ht 66.0 in | Wt 163.0 lb

## 2018-09-16 DIAGNOSIS — M25562 Pain in left knee: Secondary | ICD-10-CM

## 2018-09-16 DIAGNOSIS — G8929 Other chronic pain: Secondary | ICD-10-CM

## 2018-09-16 NOTE — Progress Notes (Signed)
Patient Taylor Mitchell, female DOB:1941/07/22, 78 y.o. LEX:[id number]  Chief Complaint  Patient presents with  . Knee Pain    left     HPI  Taylor Mitchell is a 78 y.o. female who has a new problem:  Left knee pain that began several months ago and has gotten slowly worse.  She has swelling and popping but no giving way. She has no trauma, no redness.  She has great difficulty taking most any medicine and had a reaction to a prednisone injection years ago.  She has used ice and heat and has a knee wrap.   Body mass index is 26.31 kg/m.  ROS  Review of Systems  HENT: Positive for hearing loss. Negative for congestion.   Respiratory: Negative for cough and shortness of breath.   Cardiovascular: Negative for chest pain and leg swelling.  Endocrine: Negative for cold intolerance.  Musculoskeletal: Positive for arthralgias, gait problem and joint swelling.  Allergic/Immunologic: Positive for environmental allergies.  Psychiatric/Behavioral: The patient is nervous/anxious.     All other systems reviewed and are negative.  The following is a summary of the past history medically, past history surgically, known current medicines, social history and family history.  This information is gathered electronically by the computer from prior information and documentation.  I review this each visit and have found including this information at this point in the chart is beneficial and informative.    Past Medical History:  Diagnosis Date  . Allergic rhinitis   . Anxiety   . Chronic back pain    Seeing chiropractor for almost 57 years, fell off a tractor at age 83  . Diabetes mellitus, type 2 (HCC)    Prediabetic, diet controlled  . Dyspepsia   . Hearing loss   . Hyperlipidemia    Intolerant of all medication  . Peripheral neuropathy   . Stroke ([place]) 2015  . Vitamin D deficiency     Past Surgical History:  Procedure Laterality Date  . CATARACT EXTRACTION W/PHACO Left 06/04/2017   Procedure: CATARACT EXTRACTION PHACO AND INTRAOCULAR LENS PLACEMENT (IOC);  Surgeon: Rutherford Guys, MD;  Location: [facility];  Service: Ophthalmology;  Laterality: Left;  CDE: 3.54  . CATARACT EXTRACTION W/PHACO Right 06/25/2017   Procedure: CATARACT EXTRACTION PHACO AND INTRAOCULAR LENS PLACEMENT (IOC);  Surgeon: Rutherford Guys, MD;  Location: [facility];  Service: Ophthalmology;  Laterality: Right;  CDE: 7.86  . COLONOSCOPY N/A 02/21/2016   Procedure: COLONOSCOPY;  Surgeon: Aviva Signs, MD;  Location: [facility];  Service: Gastroenterology;  Laterality: N/A;  . INGUINAL HERNIA REPAIR Right 2004 approx  . NASAL SEPTUM SURGERY  1967  . OOPHORECTOMY Bilateral   . PARTIAL HYSTERECTOMY    . TONSILLECTOMY      Family History  Problem Relation Age of Onset  . Ovarian cancer Mother   . Kidney disease Brother   . Kidney failure Brother   . Cirrhosis Sister   . Bladder Cancer Father   . Heart attack Sister   . Heart attack Sister   . Heart attack Brother   . Breast cancer Neg Hx     Social History Social History   Tobacco Use  . Smoking status: Former Smoker    Packs/day: 1.00    Years: 20.00    Pack years: 20.00    Types: Cigarettes    Start date: 08/14/1955    Last attempt to quit: 08/14/1983    Years since quitting: 35.1  . Smokeless tobacco: Current User  Types: Snuff  Substance Use Topics  . Alcohol use: No    Alcohol/week: 0.0 standard drinks  . Drug use: No    Allergies  Allergen Reactions  . Statins     Nausea, joint pain   . Metoprolol Other (See Comments)    BLOOD PRESSURE DROPS   . Pseudoephedrine Other (See Comments)    Palpatations  . Corticosteroids Rash    Current Outpatient Medications  Medication Sig Dispense Refill  . aspirin (BAYER ASPIRIN EC LOW DOSE) 81 MG EC tablet Take 81 mg by mouth every other day.     . Bromfenac Sodium (PROLENSA) 0.07 % SOLN Apply 1 drop to eye daily.    . fluticasone (FLONASE) 50 MCG/ACT nasal spray Place 2 sprays into both  nostrils daily as needed for allergies.     Marland Kitchen loratadine (CLARITIN) 10 MG tablet Take 10 mg by mouth daily as needed for allergies.     Marland Kitchen meclizine (ANTIVERT) 12.5 MG tablet Take 1 tablet (12.5 mg total) by mouth 3 (three) times daily as needed for dizziness. 30 tablet 0  . Misc Natural Products (WHITE WILLOW BARK PO) Take 400 mg by mouth daily as needed (for pain).     . mometasone (ELOCON) 0.1 % lotion Place 1 drop in affected ear (s0 twice daily as needed, for excessive itch 30 mL 1  . Multiple Vitamins-Minerals (CENTRUM SILVER 50+WOMEN) TABS Take 1 tablet by mouth daily. When she remembers    . Omega-3 Fatty Acids (FISH OIL) 1200 MG CAPS Take 2,400 mg by mouth daily.    Marland Kitchen ALPRAZolam (XANAX) 0.25 MG tablet TAKE 1/2 TABLET BY MOUTH 3 TIMES WEEKLY,AS NEEDED FOR ANXIETY (Patient not taking: Reported on 09/02/2018) 30 tablet 0  . Evolocumab (REPATHA SURECLICK) 867 MG/ML SOAJ Inject 140 mg into the skin every 14 (fourteen) days. (Patient not taking: Reported on 09/02/2018) 2 pen 11   No current facility-administered medications for this visit.      Physical Exam  Blood pressure 119/72, pulse 62, height 5\' 6"  (1.676 m), weight 163 lb (73.9 kg).  Constitutional: overall normal hygiene, normal nutrition, well developed, normal grooming, normal body habitus. Assistive device:none  Musculoskeletal: gait and station Limp left, muscle tone and strength are normal, no tremors or atrophy is present.  .  Neurological: coordination overall normal.  Deep tendon reflex/nerve stretch intact.  Sensation normal.  Cranial nerves II-XII intact.   Skin:   Normal overall no scars, lesions, ulcers or rashes. No psoriasis.  Psychiatric: Alert and oriented x 3.  Recent memory intact, remote memory unclear.  Normal mood and affect. Well groomed.  Good eye contact.  Cardiovascular: overall no swelling, no varicosities, no edema bilaterally, normal temperatures of the legs and arms, no clubbing, cyanosis and good  capillary refill.  Lymphatic: palpation is normal.  The left lower extremity is examined:  Inspection:  Thigh:  Non-tender and no defects  Knee has swelling 1/2+ effusion.                        Joint tenderness is present                        Patient is tender over the medial joint line  Lower Leg:  Has normal appearance and no tenderness or defects  Ankle:  Non-tender and no defects  Foot:  Non-tender and no defects Range of Motion:  Knee:  Range of motion is: 0-110  Crepitus is  present  Ankle:  Range of motion is normal. Strength and Tone:  The left lower extremity has normal strength and tone. Stability:  Knee:  The knee is stable.  Ankle:  The ankle is stable.   All other systems reviewed and are negative   The patient has been educated about the nature of the problem(s) and counseled on treatment options.  The patient appeared to understand what I have discussed and is in agreement with it.  Encounter Diagnosis  Name Primary?  . Chronic pain of left knee Yes   X-rays were done of the left knee, reported separately.  PLAN Call if any problems.  Precautions discussed.    I have recommended BioFreeze to the knee and continue her wrap.  She has Tumeric and will take that too. Return to clinic 3 weeks   Electronically Signed Sanjuana Kava, MD 2/4/20209:29 AM

## 2018-10-06 ENCOUNTER — Telehealth: Payer: Self-pay

## 2018-10-06 ENCOUNTER — Other Ambulatory Visit (HOSPITAL_COMMUNITY): Payer: Self-pay | Admitting: Family Medicine

## 2018-10-06 DIAGNOSIS — Z1231 Encounter for screening mammogram for malignant neoplasm of breast: Secondary | ICD-10-CM

## 2018-10-06 NOTE — Telephone Encounter (Signed)
Mammogram order re-entered

## 2018-10-07 ENCOUNTER — Ambulatory Visit: Payer: Medicare Other | Admitting: Orthopaedic Surgery

## 2018-10-07 ENCOUNTER — Encounter: Payer: Self-pay | Admitting: Orthopaedic Surgery

## 2018-10-07 VITALS — BP 130/63 | HR 55 | Wt 169.0 lb

## 2018-10-07 DIAGNOSIS — M25562 Pain in left knee: Secondary | ICD-10-CM | POA: Diagnosis not present

## 2018-10-07 DIAGNOSIS — G8929 Other chronic pain: Secondary | ICD-10-CM

## 2018-10-07 NOTE — Progress Notes (Signed)
Patient Taylor Mitchell J Taylor Mitchell, female DOB:02-10-1941, 78 y.o. KDT:[id number]  Chief Complaint  Patient presents with  . Knee Pain    HPI  Taylor Mitchell is a 78 y.o. female who has pain of the left knee.  She has less swelling.  She still has pain. She has been using the BioFreeze and the tumeric.  She has no new trauma.  She does not want injection.  She says she can live with it.   Body mass index is 27.28 kg/m.  ROS  Review of Systems  HENT: Positive for hearing loss. Negative for congestion.   Respiratory: Negative for cough and shortness of breath.   Cardiovascular: Negative for chest pain and leg swelling.  Endocrine: Negative for cold intolerance.  Musculoskeletal: Positive for arthralgias, gait problem and joint swelling.  Allergic/Immunologic: Positive for environmental allergies.  Psychiatric/Behavioral: The patient is nervous/anxious.     All other systems reviewed and are negative.  The following is a summary of the past history medically, past history surgically, known current medicines, social history and family history.  This information is gathered electronically by the computer from prior information and documentation.  I review this each visit and have found including this information at this point in the chart is beneficial and informative.    Past Medical History:  Diagnosis Date  . Allergic rhinitis   . Anxiety   . Chronic back pain    Seeing chiropractor for almost 27 years, fell off a tractor at age 37  . Diabetes mellitus, type 2 (HCC)    Prediabetic, diet controlled  . Dyspepsia   . Hearing loss   . Hyperlipidemia    Intolerant of all medication  . Peripheral neuropathy   . Stroke ([place]) 2015  . Vitamin D deficiency     Past Surgical History:  Procedure Laterality Date  . CATARACT EXTRACTION W/PHACO Left 06/04/2017   Procedure: CATARACT EXTRACTION PHACO AND INTRAOCULAR LENS PLACEMENT (IOC);  Surgeon: Rutherford Guys, MD;  Location: [facility];  Service:  Ophthalmology;  Laterality: Left;  CDE: 3.54  . CATARACT EXTRACTION W/PHACO Right 06/25/2017   Procedure: CATARACT EXTRACTION PHACO AND INTRAOCULAR LENS PLACEMENT (IOC);  Surgeon: Rutherford Guys, MD;  Location: [facility];  Service: Ophthalmology;  Laterality: Right;  CDE: 7.86  . COLONOSCOPY N/A 02/21/2016   Procedure: COLONOSCOPY;  Surgeon: Aviva Signs, MD;  [facility];  Service: Gastroenterology;  Laterality: N/A;  . INGUINAL HERNIA REPAIR Right 2004 approx  . NASAL SEPTUM SURGERY  1967  . OOPHORECTOMY Bilateral   . PARTIAL HYSTERECTOMY    . TONSILLECTOMY      Family History  Problem Relation Age of Onset  . Ovarian cancer Mother   . Kidney disease Brother   . Kidney failure Brother   . Cirrhosis Sister   . Bladder Cancer Father   . Heart attack Sister   . Heart attack Sister   . Heart attack Brother   . Breast cancer Neg Hx     Social History Social History   Tobacco Use  . Smoking status: Former Smoker    Packs/day: 1.00    Years: 20.00    Pack years: 20.00    Types: Cigarettes    Start date: 08/14/1955    Last attempt to quit: 08/14/1983    Years since quitting: 35.1  . Smokeless tobacco: Current User    Types: Snuff  Substance Use Topics  . Alcohol use: No    Alcohol/week: 0.0 standard drinks  . Drug use:  No    Allergies  Allergen Reactions  . Statins     Nausea, joint pain   . Metoprolol Other (See Comments)    BLOOD PRESSURE DROPS   . Pseudoephedrine Other (See Comments)    Palpatations  . Corticosteroids Rash    Current Outpatient Medications  Medication Sig Dispense Refill  . ALPRAZolam (XANAX) 0.25 MG tablet TAKE 1/2 TABLET BY MOUTH 3 TIMES WEEKLY,AS NEEDED FOR ANXIETY (Patient not taking: Reported on 09/02/2018) 30 tablet 0  . aspirin (BAYER ASPIRIN EC LOW DOSE) 81 MG EC tablet Take 81 mg by mouth every other day.     . Bromfenac Sodium (PROLENSA) 0.07 % SOLN Apply 1 drop to eye daily.    . Evolocumab (REPATHA SURECLICK) 540 MG/ML SOAJ  Inject 140 mg into the skin every 14 (fourteen) days. (Patient not taking: Reported on 09/02/2018) 2 pen 11  . fluticasone (FLONASE) 50 MCG/ACT nasal spray Place 2 sprays into both nostrils daily as needed for allergies.     Marland Kitchen loratadine (CLARITIN) 10 MG tablet Take 10 mg by mouth daily as needed for allergies.     Marland Kitchen meclizine (ANTIVERT) 12.5 MG tablet Take 1 tablet (12.5 mg total) by mouth 3 (three) times daily as needed for dizziness. 30 tablet 0  . Misc Natural Products (WHITE WILLOW BARK PO) Take 400 mg by mouth daily as needed (for pain).     . mometasone (ELOCON) 0.1 % lotion Place 1 drop in affected ear (s0 twice daily as needed, for excessive itch 30 mL 1  . Multiple Vitamins-Minerals (CENTRUM SILVER 50+WOMEN) TABS Take 1 tablet by mouth daily. When she remembers    . Omega-3 Fatty Acids (FISH OIL) 1200 MG CAPS Take 2,400 mg by mouth daily.     No current facility-administered medications for this visit.      Physical Exam  Blood pressure 130/63, pulse (!) 55, weight 169 lb (76.7 kg).  Constitutional: overall normal hygiene, normal nutrition, well developed, normal grooming, normal body habitus. Assistive device:none  Musculoskeletal: gait and station Limp left, muscle tone and strength are normal, no tremors or atrophy is present.  .  Neurological: coordination overall normal.  Deep tendon reflex/nerve stretch intact.  Sensation normal.  Cranial nerves II-XII intact.   Skin:   Normal overall no scars, lesions, ulcers or rashes. No psoriasis.  Psychiatric: Alert and oriented x 3.  Recent memory intact, remote memory unclear.  Normal mood and affect. Well groomed.  Good eye contact.  Cardiovascular: overall no swelling, no varicosities, no edema bilaterally, normal temperatures of the legs and arms, no clubbing, cyanosis and good capillary refill.  Lymphatic: palpation is normal.  Left knee has very slight effusion, crepitus, ROM 0 to 110,slight limp to the left, NV  intact.  All other systems reviewed and are negative   The patient has been educated about the nature of the problem(s) and counseled on treatment options.  The patient appeared to understand what I have discussed and is in agreement with it.  Encounter Diagnosis  Name Primary?  . Chronic pain of left knee Yes    PLAN Call if any problems.  Precautions discussed.  Continue current medications.   Return to clinic PRN (her request)   Electronically Signed Sanjuana Kava, MD 2/25/20203:12 PM

## 2018-10-14 ENCOUNTER — Other Ambulatory Visit (HOSPITAL_COMMUNITY): Payer: Self-pay

## 2018-10-14 ENCOUNTER — Encounter (HOSPITAL_COMMUNITY): Payer: Self-pay

## 2018-11-27 ENCOUNTER — Encounter: Payer: Self-pay | Admitting: *Deleted

## 2018-12-01 ENCOUNTER — Other Ambulatory Visit: Payer: Self-pay | Admitting: Family Medicine

## 2018-12-01 DIAGNOSIS — N6320 Unspecified lump in the left breast, unspecified quadrant: Secondary | ICD-10-CM

## 2018-12-01 DIAGNOSIS — Z1231 Encounter for screening mammogram for malignant neoplasm of breast: Secondary | ICD-10-CM

## 2018-12-03 IMAGING — MG MM DIGITAL SCREENING BILAT W/ TOMO W/ CAD
9 of 12 series · 9 of 28 positions shown · non-contrast
Comparison: Previous exam(s).

CLINICAL DATA: Screening.

EXAM:
2D DIGITAL SCREENING BILATERAL MAMMOGRAM WITH CAD AND ADJUNCT TOMO

[L CC]
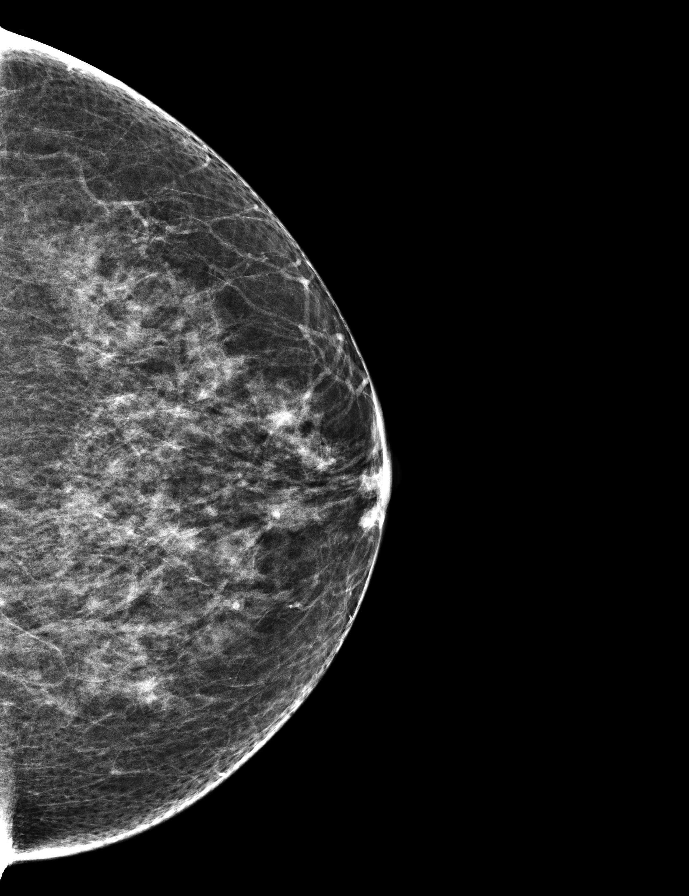

[L MLO]
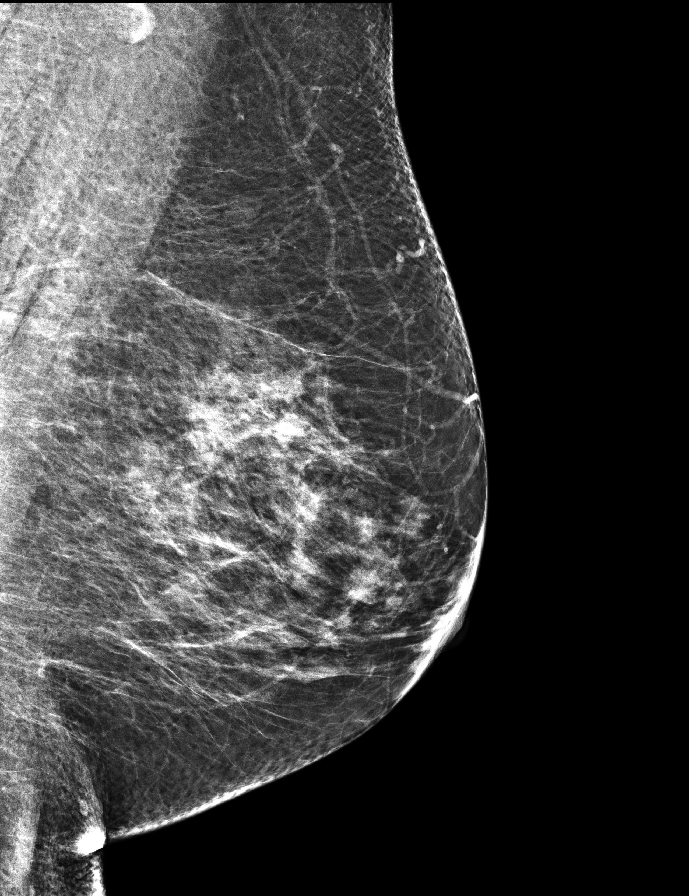

[R MLO synth-2D]
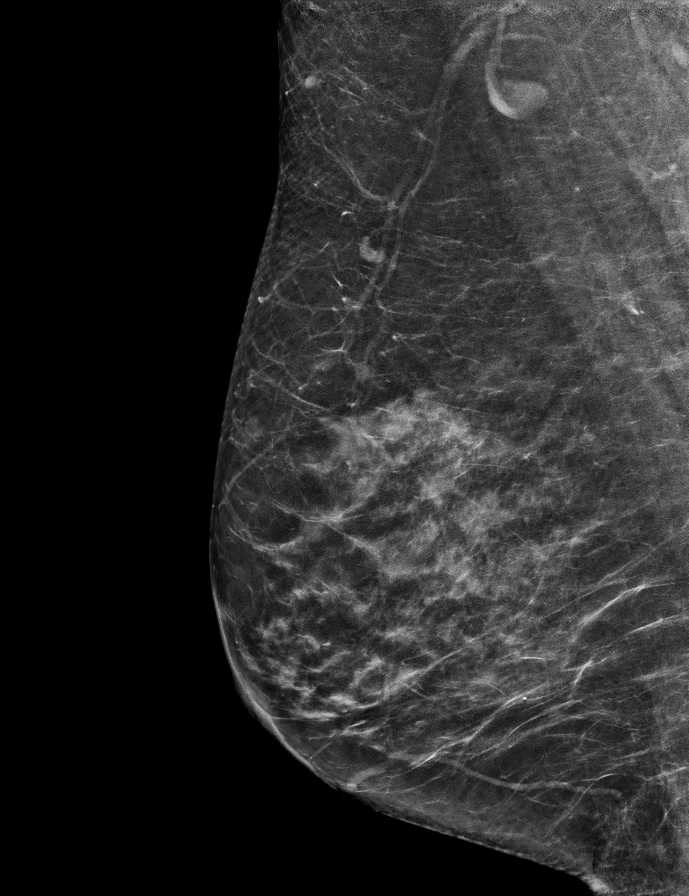

[L MLO synth-2D]
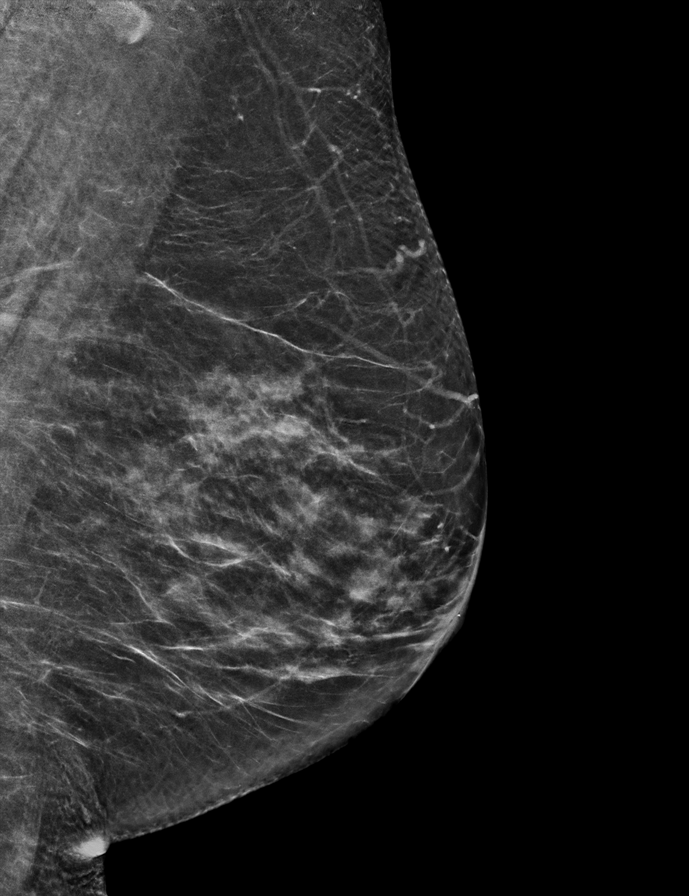

[L CC synth-2D]
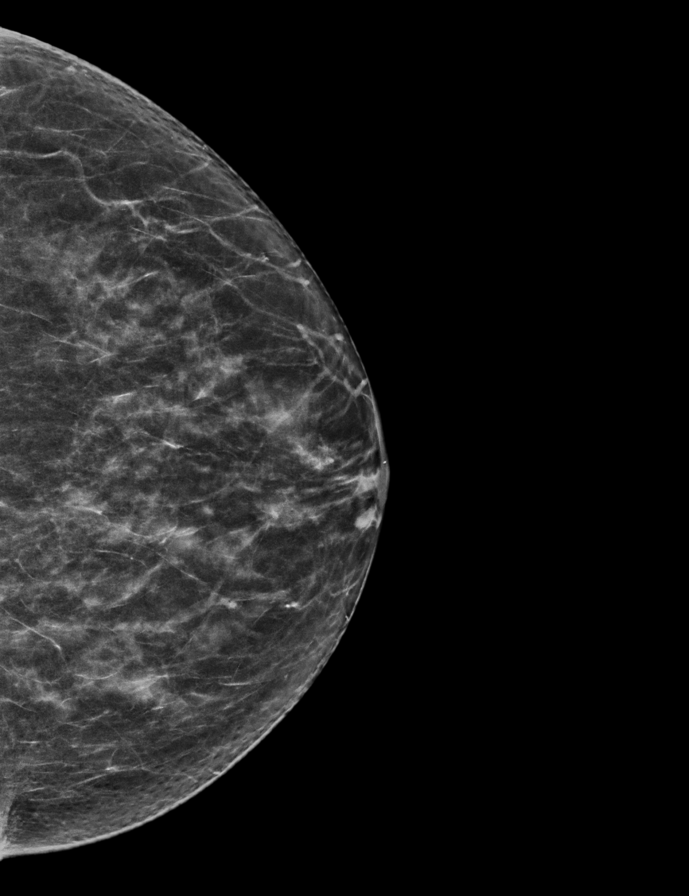

[R CC synth-2D]
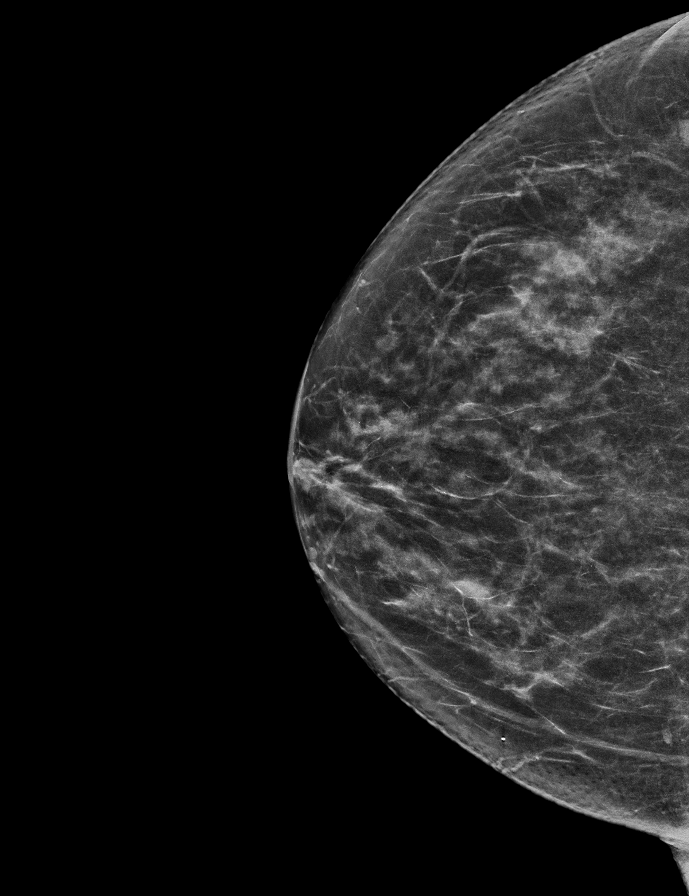

[R CC]
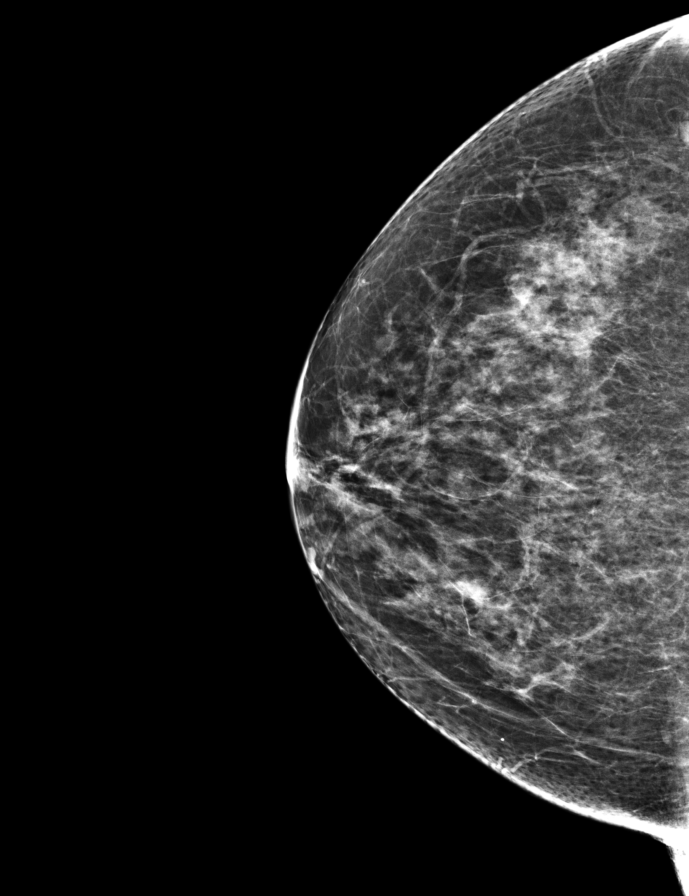

[R MLO]
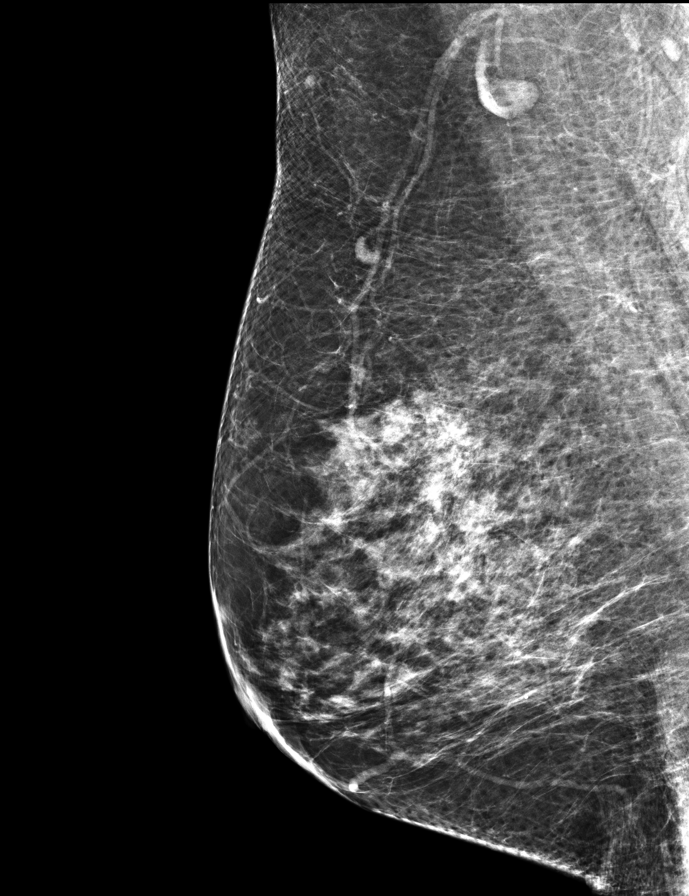

[R MLO tomo · tomo slice 34/67.0]
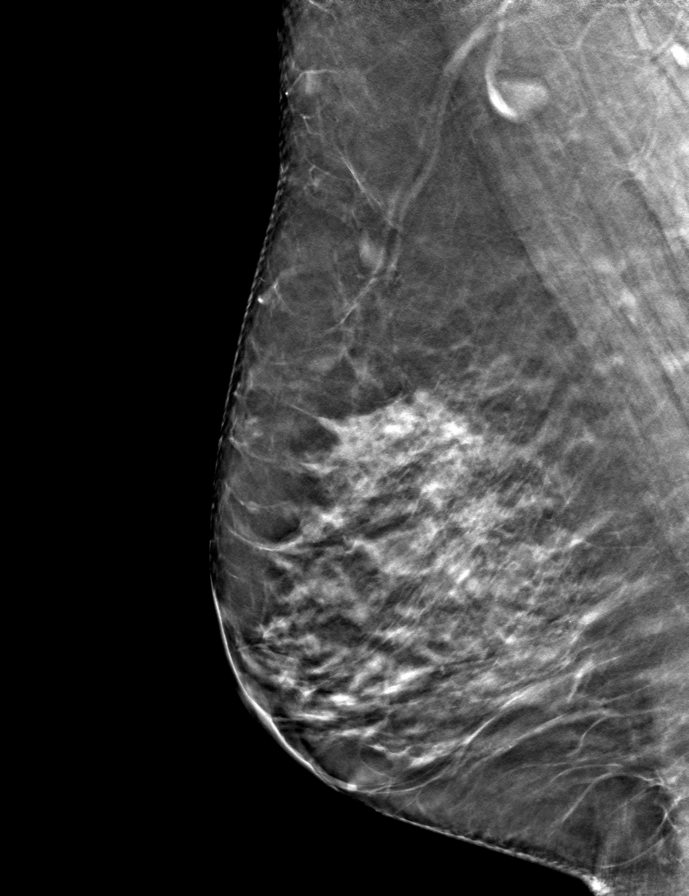

[9 of 28 positions shown; findings below may reference images not displayed]

ACR Breast Density Category b: There are scattered areas of
fibroglandular density.
FINDINGS: There are no findings suspicious for malignancy. Images were
processed with CAD.
IMPRESSION: No mammographic evidence of malignancy. A result letter of this
screening mammogram will be mailed directly to the patient.

RECOMMENDATION:
Screening mammogram in one year. (Code:97-6-RS4)

BI-RADS CATEGORY  1: Negative.

## 2019-01-13 ENCOUNTER — Ambulatory Visit: Payer: Medicare Other

## 2019-01-13 ENCOUNTER — Other Ambulatory Visit: Payer: Self-pay

## 2019-01-15 ENCOUNTER — Inpatient Hospital Stay: Admission: RE | Admit: 2019-01-15 | Payer: Medicare Other | Source: Ambulatory Visit

## 2019-01-15 ENCOUNTER — Other Ambulatory Visit: Payer: Self-pay

## 2019-01-23 ENCOUNTER — Ambulatory Visit
Admission: RE | Admit: 2019-01-23 | Discharge: 2019-01-23 | Disposition: A | Discharge status: Home / Self Care | Payer: Medicare Other | Source: Ambulatory Visit | Attending: Family Medicine | Admitting: Family Medicine

## 2019-01-23 ENCOUNTER — Other Ambulatory Visit: Payer: Self-pay

## 2019-01-23 DIAGNOSIS — Z1231 Encounter for screening mammogram for malignant neoplasm of breast: Secondary | ICD-10-CM

## 2019-01-23 DIAGNOSIS — N6320 Unspecified lump in the left breast, unspecified quadrant: Secondary | ICD-10-CM | POA: Insufficient documentation

## 2019-01-23 DIAGNOSIS — R922 Inconclusive mammogram: Secondary | ICD-10-CM | POA: Diagnosis not present

## 2019-01-23 DIAGNOSIS — N6002 Solitary cyst of left breast: Secondary | ICD-10-CM | POA: Insufficient documentation

## 2019-01-30 ENCOUNTER — Encounter (INDEPENDENT_AMBULATORY_CARE_PROVIDER_SITE_OTHER): Payer: Self-pay

## 2019-01-30 ENCOUNTER — Encounter: Payer: Self-pay | Admitting: Family Medicine

## 2019-01-30 ENCOUNTER — Other Ambulatory Visit: Payer: Self-pay

## 2019-01-30 ENCOUNTER — Ambulatory Visit (INDEPENDENT_AMBULATORY_CARE_PROVIDER_SITE_OTHER): Payer: Medicare Other | Admitting: Family Medicine

## 2019-01-30 VITALS — BP 130/63 | HR 55 | Resp 12 | Ht 66.0 in | Wt 169.0 lb

## 2019-01-30 DIAGNOSIS — Z0000 Encounter for general adult medical examination without abnormal findings: Secondary | ICD-10-CM | POA: Diagnosis not present

## 2019-01-30 NOTE — Patient Instructions (Signed)
Taylor Mitchell , Thank you for taking time to come for your Medicare Wellness Visit. I appreciate your ongoing commitment to your health goals. Please review the following plan we discussed and let me know if I can assist you in the future.   Screening recommendations/referrals: Colonoscopy: Due 2022 Mammogram: Completed 2020, due 2021 Bone Density: Completed- Recommended yearly ophthalmology/optometry visit for glaucoma screening and checkup Recommended yearly dental visit for hygiene and checkup  Vaccinations: Influenza vaccine: Due Fall 2020 Pneumococcal vaccine: Completed Tdap vaccine: Due when you come back in. Check insurance coverage Shingles vaccine: Check insurance coverage incase it will cover now  Conditions/risks identified: Falls are always a risk. Also read the information below for preventive care  Next appointment: 04/08/2019    Preventive Care 76 Years and Older, Female Preventive care refers to lifestyle choices and visits with your health care provider that can promote health and wellness. What does preventive care include?  A yearly physical exam. This is also called an annual well check.  Dental exams once or twice a year.  Routine eye exams. Ask your health care provider how often you should have your eyes checked.  Personal lifestyle choices, including:  Daily care of your teeth and gums.  Regular physical activity.  Eating a healthy diet.  Avoiding tobacco and drug use.  Limiting alcohol use.  Practicing safe sex.  Taking low-dose aspirin every day.  Taking vitamin and mineral supplements as recommended by your health care provider. What happens during an annual well check? The services and screenings done by your health care provider during your annual well check will depend on your age, overall health, lifestyle risk factors, and family history of disease. Counseling  Your health care provider may ask you questions about your:  Alcohol use.   Tobacco use.  Drug use.  Emotional well-being.  Home and relationship well-being.  Sexual activity.  Eating habits.  History of falls.  Memory and ability to understand (cognition).  Work and work Statistician.  Reproductive health. Screening  You may have the following tests or measurements:  Height, weight, and BMI.  Blood pressure.  Lipid and cholesterol levels. These may be checked every 5 years, or more frequently if you are over 59 years old.  Skin check.  Lung cancer screening. You may have this screening every year starting at age 13 if you have a 30-pack-year history of smoking and currently smoke or have quit within the past 15 years.  Fecal occult blood test (FOBT) of the stool. You may have this test every year starting at age 76.  Flexible sigmoidoscopy or colonoscopy. You may have a sigmoidoscopy every 5 years or a colonoscopy every 10 years starting at age 76.  Hepatitis C blood test.  Hepatitis B blood test.  Sexually transmitted disease (STD) testing.  Diabetes screening. This is done by checking your blood sugar (glucose) after you have not eaten for a while (fasting). You may have this done every 1-3 years.  Bone density scan. This is done to screen for osteoporosis. You may have this done starting at age 76.  Mammogram. This may be done every 1-2 years. Talk to your health care provider about how often you should have regular mammograms. Talk with your health care provider about your test results, treatment options, and if necessary, the need for more tests. Vaccines  Your health care provider may recommend certain vaccines, such as:  Influenza vaccine. This is recommended every year.  Tetanus, diphtheria, and acellular pertussis (Tdap,  Td) vaccine. You may need a Td booster every 10 years.  Zoster vaccine. You may need this after age 15.  Pneumococcal 13-valent conjugate (PCV13) vaccine. One dose is recommended after age 76.  Pneumococcal  polysaccharide (PPSV23) vaccine. One dose is recommended after age 76. Talk to your health care provider about which screenings and vaccines you need and how often you need them. This information is not intended to replace advice given to you by your health care provider. Make sure you discuss any questions you have with your health care provider. Document Released: 08/26/2015 Document Revised: 04/18/2016 Document Reviewed: 05/31/2015 Elsevier Interactive Patient Education  2017 [employer] Prevention in the Home Falls can cause injuries. They can happen to people of all ages. There are many things you can do to make your home safe and to help prevent falls. What can I do on the outside of my home?  Regularly fix the edges of walkways and driveways and fix any cracks.  Remove anything that might make you trip as you walk through a door, such as a raised step or threshold.  Trim any bushes or trees on the path to your home.  Use bright outdoor lighting.  Clear any walking paths of anything that might make someone trip, such as rocks or tools.  Regularly check to see if handrails are loose or broken. Make sure that both sides of any steps have handrails.  Any raised decks and porches should have guardrails on the edges.  Have any leaves, snow, or ice cleared regularly.  Use sand or salt on walking paths during winter.  Clean up any spills in your garage right away. This includes oil or grease spills. What can I do in the bathroom?  Use night lights.  Install grab bars by the toilet and in the tub and shower. Do not use towel bars as grab bars.  Use non-skid mats or decals in the tub or shower.  If you need to sit down in the shower, use a plastic, non-slip stool.  Keep the floor dry. Clean up any water that spills on the floor as soon as it happens.  Remove soap buildup in the tub or shower regularly.  Attach bath mats securely with double-sided non-slip rug tape.   Do not have throw rugs and other things on the floor that can make you trip. What can I do in the bedroom?  Use night lights.  Make sure that you have a light by your bed that is easy to reach.  Do not use any sheets or blankets that are too big for your bed. They should not hang down onto the floor.  Have a firm chair that has side arms. You can use this for support while you get dressed.  Do not have throw rugs and other things on the floor that can make you trip. What can I do in the kitchen?  Clean up any spills right away.  Avoid walking on wet floors.  Keep items that you use a lot in easy-to-reach places.  If you need to reach something above you, use a strong step stool that has a grab bar.  Keep electrical cords out of the way.  Do not use floor polish or wax that makes floors slippery. If you must use wax, use non-skid floor wax.  Do not have throw rugs and other things on the floor that can make you trip. What can I do with my stairs?  Do not  leave any items on the stairs.  Make sure that there are handrails on both sides of the stairs and use them. Fix handrails that are broken or loose. Make sure that handrails are as long as the stairways.  Check any carpeting to make sure that it is firmly attached to the stairs. Fix any carpet that is loose or worn.  Avoid having throw rugs at the top or bottom of the stairs. If you do have throw rugs, attach them to the floor with carpet tape.  Make sure that you have a light switch at the top of the stairs and the bottom of the stairs. If you do not have them, ask someone to add them for you. What else can I do to help prevent falls?  Wear shoes that:  Do not have high heels.  Have rubber bottoms.  Are comfortable and fit you well.  Are closed at the toe. Do not wear sandals.  If you use a stepladder:  Make sure that it is fully opened. Do not climb a closed stepladder.  Make sure that both sides of the  stepladder are locked into place.  Ask someone to hold it for you, if possible.  Clearly mark and make sure that you can see:  Any grab bars or handrails.  First and last steps.  Where the edge of each step is.  Use tools that help you move around (mobility aids) if they are needed. These include:  Canes.  Walkers.  Scooters.  Crutches.  Turn on the lights when you go into a dark area. Replace any light bulbs as soon as they burn out.  Set up your furniture so you have a clear path. Avoid moving your furniture around.  If any of your floors are uneven, fix them.  If there are any pets around you, be aware of where they are.  Review your medicines with your doctor. Some medicines can make you feel dizzy. This can increase your chance of falling. Ask your doctor what other things that you can do to help prevent falls. This information is not intended to replace advice given to you by your health care provider. Make sure you discuss any questions you have with your health care provider. Document Released: 05/26/2009 Document Revised: 01/05/2016 Document Reviewed: 09/03/2014 Elsevier Interactive Patient Education  2017 [employer].

## 2019-01-30 NOTE — Progress Notes (Signed)
Subjective:   Joniyah Mallinger Bhatt is a 78 y.o. female who presents for Medicare Annual (Subsequent) preventive examination.  Location of Patient: Home Location of Provider: Telehealth Consent was obtain for visit to be over via telehealth. I verified that I am speaking with the correct person using two identifiers.   Review of Systems:    Cardiac Risk Factors include: advanced age (>69men, >63 women);diabetes mellitus     Objective:     Vitals: BP 130/63   Pulse (!) 55   Resp 12   Ht 5\' 6"  (1.676 m)   Wt 169 lb (76.7 kg)   BMI 27.28 kg/m   Body mass index is 27.28 kg/m.  Advanced Directives 01/28/2018 06/25/2017 06/04/2017 06/03/2017 11/15/2016 02/21/2016  Does Patient Have a Medical Advance Directive? No No No No No No  Would patient like information on creating a medical advance directive? No - Patient declined No - Patient declined - - No - Patient declined No - patient declined information    Tobacco Social History   Tobacco Use  Smoking Status Former Smoker  . Packs/day: 1.00  . Years: 20.00  . Pack years: 20.00  . Types: Cigarettes  . Start date: 08/14/1955  . Quit date: 08/14/1983  . Years since quitting: 35.4  Smokeless Tobacco Current User  . Types: Snuff     Ready to quit: No Counseling given: Yes   Clinical Intake:  Pre-visit preparation completed: Yes  Pain : No/denies pain Pain Score: 0-No pain     BMI - recorded: 27.28 Nutritional Status: BMI 25 -29 Overweight Nutritional Risks: None Diabetes: No  How often do you need to have someone help you when you read instructions, pamphlets, or other written materials from your doctor or pharmacy?: 1 - Never What is the last grade level you completed in school?: GED  Interpreter Needed?: No     Past Medical History:  Diagnosis Date  . Allergic rhinitis   . Anxiety   . Chronic back pain    Seeing chiropractor for almost 34 years, fell off a tractor at age 62  . Diabetes mellitus, type 2 (HCC)    Prediabetic, diet controlled  . Dyspepsia   . Hearing loss   . Hyperlipidemia    Intolerant of all medication  . Peripheral neuropathy   . Stroke ([place]) 2015  . Vitamin D deficiency    Past Surgical History:  Procedure Laterality Date  . CATARACT EXTRACTION W/PHACO Left 06/04/2017   Procedure: CATARACT EXTRACTION PHACO AND INTRAOCULAR LENS PLACEMENT (IOC);  Surgeon: Rutherford Guys, MD;  Location: [facility];  Service: Ophthalmology;  Laterality: Left;  CDE: 3.54  . CATARACT EXTRACTION W/PHACO Right 06/25/2017   Procedure: CATARACT EXTRACTION PHACO AND INTRAOCULAR LENS PLACEMENT (IOC);  Surgeon: Rutherford Guys, MD;  Location: [facility];  Service: Ophthalmology;  Laterality: Right;  CDE: 7.86  . COLONOSCOPY N/A 02/21/2016   Procedure: COLONOSCOPY;  Surgeon: Aviva Signs, MD;  Location: [facility];  Service: Gastroenterology;  Laterality: N/A;  . INGUINAL HERNIA REPAIR Right 2004 approx  . NASAL SEPTUM SURGERY  1967  . OOPHORECTOMY Bilateral   . PARTIAL HYSTERECTOMY    . TONSILLECTOMY     Family History  Problem Relation Age of Onset  . Ovarian cancer Mother   . Kidney disease Brother   . Kidney failure Brother   . Cirrhosis Sister   . Bladder Cancer Father   . Heart attack Sister   . Heart attack Sister   . Heart attack Brother   .  Breast cancer Neg Hx    Social History   Socioeconomic History  . Marital status: Married    Spouse name: Not on file  . Number of children: Not on file  . Years of education: Not on file  . Highest education level: Not on file  Occupational History  . Not on file  Social Needs  . Financial resource strain: Not very hard  . Food insecurity    Worry: Never true    Inability: Never true  . Transportation needs    Medical: No    Non-medical: No  Tobacco Use  . Smoking status: Former Smoker    Packs/day: 1.00    Years: 20.00    Pack years: 20.00    Types: Cigarettes    Start date: 08/14/1955    Quit date: 08/14/1983    Years since quitting: 35.4   . Smokeless tobacco: Current User    Types: Snuff  Substance and Sexual Activity  . Alcohol use: No    Alcohol/week: 0.0 standard drinks  . Drug use: No  . Sexual activity: Never  Lifestyle  . Physical activity    Days per week: 0 days    Minutes per session: 0 min  . Stress: To some extent  Relationships  . Social connections    Talks on phone: More than three times a week    Gets together: Once a week    Attends religious service: Never    Active member of club or organization: No    Attends meetings of clubs or organizations: Never    Relationship status: Married  Other Topics Concern  . Not on file  Social History Narrative  . Not on file    Outpatient Encounter Medications as of 01/30/2019  Medication Sig  . ALPRAZolam (XANAX) 0.25 MG tablet TAKE 1/2 TABLET BY MOUTH 3 TIMES WEEKLY,AS NEEDED FOR ANXIETY  . aspirin (BAYER ASPIRIN EC LOW DOSE) 81 MG EC tablet Take 81 mg by mouth every other day.   . Bromfenac Sodium (PROLENSA) 0.07 % SOLN Apply 1 drop to eye daily.  . fluticasone (FLONASE) 50 MCG/ACT nasal spray Place 2 sprays into both nostrils daily as needed for allergies.   Marland Kitchen loratadine (CLARITIN) 10 MG tablet Take 10 mg by mouth daily as needed for allergies.   Marland Kitchen meclizine (ANTIVERT) 12.5 MG tablet Take 1 tablet (12.5 mg total) by mouth 3 (three) times daily as needed for dizziness.  . Misc Natural Products (WHITE WILLOW BARK PO) Take 400 mg by mouth daily as needed (for pain).   . mometasone (ELOCON) 0.1 % lotion Place 1 drop in affected ear (s0 twice daily as needed, for excessive itch  . Multiple Vitamins-Minerals (CENTRUM SILVER 50+WOMEN) TABS Take 1 tablet by mouth daily. When she remembers  . Omega-3 Fatty Acids (FISH OIL) 1200 MG CAPS Take 2,400 mg by mouth daily.  . [DISCONTINUED] Evolocumab (REPATHA SURECLICK) 268 MG/ML SOAJ Inject 140 mg into the skin every 14 (fourteen) days. (Patient not taking: Reported on 09/02/2018)   No facility-administered encounter  medications on file as of 01/30/2019.     Activities of Daily Living In your present state of health, do you have any difficulty performing the following activities: 01/30/2019  Hearing? Y  Vision? N  Difficulty concentrating or making decisions? N  Walking or climbing stairs? N  Dressing or bathing? N  Doing errands, shopping? N  Preparing Food and eating ? N  Using the Toilet? N  In the past six months,  have you accidently leaked urine? N  Do you have problems with loss of bowel control? N  Managing your Medications? N  Managing your Finances? N  Housekeeping or managing your Housekeeping? N  Some recent data might be hidden    Patient Care Team: Fayrene Helper, MD as PCP - General Domenic Polite Aloha Gell, MD as PCP - Cardiology (Cardiology) Allyn Kenner, MD as Consulting Physician (Dermatology)    Assessment:   This is a routine wellness examination for Sage.  Exercise Activities and Dietary recommendations Current Exercise Habits: Home exercise routine, Type of exercise: walking, Time (Minutes): 30, Frequency (Times/Week): 7, Weekly Exercise (Minutes/Week): 210, Intensity: Mild, Exercise limited by: None identified  Goals    . Exercise 3x per week (30 min per time)     Recommend starting a routine exercise program at least 3 days a week for 30-45 minutes at a time as tolerated.         Fall Risk Fall Risk  01/30/2019 09/02/2018 04/01/2018 03/26/2018 01/30/2018  Falls in the past year? 0 0 No No No  Number falls in past yr: - 0 - - -  Comment - - - - -  Injury with Fall? 0 0 - - -  Comment - - - - -  Risk for fall due to : - - - - -  Follow up - - - - -  Comment - - - - -   Is the patient's home free of loose throw rugs in walkways, pet beds, electrical cords, etc?   yes      Grab bars in the bathroom? yes      Handrails on the stairs?   yes      Adequate lighting?   yes     Depression Screen PHQ 2/9 Scores 01/30/2019 09/02/2018 04/01/2018 03/26/2018  PHQ - 2 Score  0 0 0 0  PHQ- 9 Score - - 2 -     Cognitive Function     6CIT Screen 01/30/2019 01/28/2018 11/15/2016  What Year? 0 points 0 points 0 points  What month? 0 points 0 points 0 points  What time? 0 points 0 points 0 points  Count back from 20 0 points 0 points 0 points  Months in reverse 0 points 0 points 0 points  Repeat phrase 0 points 0 points 0 points  Total Score 0 0 0    Immunization History  Administered Date(s) Administered  . Influenza Split 04/17/2012, 05/14/2013  . Influenza,inj,Quad PF,6+ Mos 05/18/2014, 05/02/2015, 06/19/2016, 05/27/2017, 05/15/2018  . Pneumococcal Conjugate-13 03/02/2014  . Pneumococcal Polysaccharide-23 09/19/2015  . Td 10/28/2008    Qualifies for Shingles Vaccine? Insurance does not cover  Screening Tests Health Maintenance  Topic Date Due  . TETANUS/TDAP  10/29/2018  . HEMOGLOBIN A1C  03/03/2019  . INFLUENZA VACCINE  03/14/2019  . COLONOSCOPY  02/20/2021  . DEXA SCAN  Completed  . PNA vac Low Risk Adult  Completed    Cancer Screenings: Lung: Low Dose CT Chest recommended if Age 43-80 years, 30 pack-year currently smoking OR have quit w/in 15years. Patient does not qualify. Breast:  Up to date on Mammogram? Yes   Up to date of Bone Density/Dexa? Yes Colorectal:  Due 2022  Additional Screenings:   Hepatitis C Screening: needs to be added to next labs     Plan:      1. Encounter for Medicare annual wellness exam  I have personally reviewed and noted the following in the patient's chart:   .  Medical and social history . Use of alcohol, tobacco or illicit drugs  . Current medications and supplements . Functional ability and status . Nutritional status . Physical activity . Advanced directives . List of other physicians . Hospitalizations, surgeries, and ER visits in previous 12 months . Vitals . Screenings to include cognitive, depression, and falls . Referrals and appointments  In addition, I have reviewed and discussed with  patient certain preventive protocols, quality metrics, and best practice recommendations. A written personalized care plan for preventive services as well as general preventive health recommendations were provided to patient.    I provided 20 minutes of non-face-to-face time during this encounter.   Perlie Mayo, NP  01/30/2019

## 2019-02-02 ENCOUNTER — Ambulatory Visit: Payer: Self-pay

## 2019-03-02 ENCOUNTER — Telehealth: Payer: Self-pay | Admitting: Pharmacist

## 2019-03-02 MED ORDER — PRALUENT 75 MG/ML ~~LOC~~ SOAJ: 1.0000 "pen " | SUBCUTANEOUS | 11 refills | Status: DC

## 2019-03-02 NOTE — Telephone Encounter (Signed)
Praluent prior authorization approved through 09/02/19, copay $47/month. This is affordable for pt. She will call if she has trouble tolerating therapy.

## 2019-03-02 NOTE — Telephone Encounter (Signed)
Called pt regarding cholesterol - she states she stopped taking Repatha due to muscle aches and hair loss last December. Symptoms improved once she stopped Repatha shots. Discussed trying lower dose of Praluent since pt needs additional lipid lowering therapy due to baseline LDL > 200. She is agreeable to trying Praluent. Will submit prior auth and forward to Dr Domenic Polite as an Juluis Rainier per pt request.

## 2019-03-02 NOTE — Addendum Note (Signed)
Addended by: SUPPLE, MEGAN E on: 03/02/2019 04:10 PM   Modules accepted: Orders

## 2019-03-02 NOTE — Telephone Encounter (Signed)
Noted. Thank you for the update.

## 2019-03-03 DIAGNOSIS — H52203 Unspecified astigmatism, bilateral: Secondary | ICD-10-CM | POA: Diagnosis not present

## 2019-03-03 DIAGNOSIS — Z961 Presence of intraocular lens: Secondary | ICD-10-CM | POA: Diagnosis not present

## 2019-03-03 DIAGNOSIS — H524 Presbyopia: Secondary | ICD-10-CM | POA: Diagnosis not present

## 2019-03-03 DIAGNOSIS — E119 Type 2 diabetes mellitus without complications: Secondary | ICD-10-CM | POA: Diagnosis not present

## 2019-03-03 DIAGNOSIS — H5203 Hypermetropia, bilateral: Secondary | ICD-10-CM | POA: Diagnosis not present

## 2019-03-03 LAB — HM DIABETES EYE EXAM

## 2019-04-02 ENCOUNTER — Telehealth: Payer: Self-pay

## 2019-04-02 DIAGNOSIS — E1169 Type 2 diabetes mellitus with other specified complication: Secondary | ICD-10-CM

## 2019-04-02 NOTE — Telephone Encounter (Signed)
Labs ordered.

## 2019-04-03 DIAGNOSIS — M545 Low back pain: Secondary | ICD-10-CM | POA: Diagnosis not present

## 2019-04-03 DIAGNOSIS — M546 Pain in thoracic spine: Secondary | ICD-10-CM | POA: Diagnosis not present

## 2019-04-03 DIAGNOSIS — M9902 Segmental and somatic dysfunction of thoracic region: Secondary | ICD-10-CM | POA: Diagnosis not present

## 2019-04-03 DIAGNOSIS — M9903 Segmental and somatic dysfunction of lumbar region: Secondary | ICD-10-CM | POA: Diagnosis not present

## 2019-04-03 DIAGNOSIS — M9905 Segmental and somatic dysfunction of pelvic region: Secondary | ICD-10-CM | POA: Diagnosis not present

## 2019-04-03 DIAGNOSIS — E1169 Type 2 diabetes mellitus with other specified complication: Secondary | ICD-10-CM | POA: Diagnosis not present

## 2019-04-04 ENCOUNTER — Other Ambulatory Visit: Payer: Self-pay | Admitting: Family Medicine

## 2019-04-08 ENCOUNTER — Other Ambulatory Visit: Payer: Self-pay

## 2019-04-08 ENCOUNTER — Ambulatory Visit (INDEPENDENT_AMBULATORY_CARE_PROVIDER_SITE_OTHER): Payer: Medicare Other | Admitting: Family Medicine

## 2019-04-08 ENCOUNTER — Encounter: Payer: Self-pay | Admitting: Family Medicine

## 2019-04-08 VITALS — BP 130/63 | Ht 66.0 in | Wt 180.0 lb

## 2019-04-08 DIAGNOSIS — E1169 Type 2 diabetes mellitus with other specified complication: Secondary | ICD-10-CM

## 2019-04-08 DIAGNOSIS — E663 Overweight: Secondary | ICD-10-CM | POA: Diagnosis not present

## 2019-04-08 DIAGNOSIS — E785 Hyperlipidemia, unspecified: Secondary | ICD-10-CM | POA: Diagnosis not present

## 2019-04-08 DIAGNOSIS — E559 Vitamin D deficiency, unspecified: Secondary | ICD-10-CM

## 2019-04-08 LAB — TSH: TSH: 3.76 mIU/L (ref 0.40–4.50)

## 2019-04-08 LAB — BASIC METABOLIC PANEL WITH GFR
BUN: 14 mg/dL (ref 7–25)
CO2: 27 mmol/L (ref 20–32)
Calcium: 9.1 mg/dL (ref 8.6–10.4)
Chloride: 106 mmol/L (ref 98–110)
Creat: 0.68 mg/dL (ref 0.60–0.93)
GFR, Est African American: 98 mL/min/{1.73_m2} (ref >=60)
GFR, Est Non African American: 85 mL/min/{1.73_m2} (ref >=60)
Glucose, Bld: 124 mg/dL — ABNORMAL HIGH (ref 65–99)
Potassium: 4.1 mmol/L (ref 3.5–5.3)
Sodium: 142 mmol/L (ref 135–146)

## 2019-04-08 LAB — HEPATIC FUNCTION PANEL
AG Ratio: 1.8 (calc) (ref 1.0–2.5)
ALT: 10 U/L (ref 6–29)
AST: 11 U/L (ref 10–35)
Albumin: 4.2 g/dL (ref 3.6–5.1)
Alkaline phosphatase (APISO): 54 U/L (ref 37–153)
Bilirubin, Direct: 0.1 mg/dL (ref 0.0–0.2)
Globulin: 2.4 g/dL (calc) (ref 1.9–3.7)
Indirect Bilirubin: 0.2 mg/dL (calc) (ref 0.2–1.2)
Total Bilirubin: 0.3 mg/dL (ref 0.2–1.2)
Total Protein: 6.6 g/dL (ref 6.1–8.1)

## 2019-04-08 LAB — LIPID PANEL
Cholesterol: 248 mg/dL — ABNORMAL HIGH (ref <200)
HDL: 57 mg/dL (ref >=50)
LDL Cholesterol (Calc): 160 mg/dL (calc) — ABNORMAL HIGH
Non-HDL Cholesterol (Calc): 191 mg/dL (calc) — ABNORMAL HIGH (ref <130)
Total CHOL/HDL Ratio: 4.4 (calc) (ref <5.0)
Triglycerides: 161 mg/dL — ABNORMAL HIGH (ref <150)

## 2019-04-08 LAB — TEST AUTHORIZATION

## 2019-04-08 LAB — HEMOGLOBIN A1C
Hgb A1c MFr Bld: 7 % of total Hgb — ABNORMAL HIGH (ref <5.7)
Mean Plasma Glucose: 154 (calc)
eAG (mmol/L): 8.5 (calc)

## 2019-04-08 NOTE — Progress Notes (Signed)
Virtual Visit via Telephone Note  I connected with Taylor Mitchell on 04/08/19 at  9:20 AM EDT by telephone and verified that I am speaking with the correct person using two identifiers.  Location: Patient: home Provider: office   I discussed the limitations, risks, security and privacy concerns of performing an evaluation and management service by telephone and the availability of in person appointments. I also discussed with the patient that there may be a patient responsible charge related to this service. The patient expressed understanding and agreed to proceed.   History of Present Illness:    Observations/Objective: BP 130/63   Ht 5\' 6"  (1.676 m)   Wt 180 lb (81.6 kg)   BMI 29.05 kg/m  pt weighed at home Good communication with no confusion and intact memory. Alert and oriented x 3 No signs of respiratory distress during speech   Assessment and Plan: BP 130/63   Ht 5\' 6"  (1.676 m)   Wt 180 lb (81.6 kg)   BMI 29.05 kg/m  patient reported Good communication with no confusion and intact memory. Alert and oriented x 3 No signs of respiratory distress during speech    Follow Up Instructions:    I discussed the assessment and treatment plan with the patient. The patient was provided an opportunity to ask questions and all were answered. The patient agreed with the plan and demonstrated an understanding of the instructions.   The patient was advised to call back or seek an in-person evaluation if the symptoms worsen or if the condition fails to improve as anticipated.  I provided 22 minutes of non-face-to-face time during this encounter.   Tula Nakayama, MD  This visit type is conducted due to national recommendations for restrictions regarding the COVID -19 Pandemic. Due to the patient's age and / or co morbidities, this format is felt to be most appropriate at this time without adequate follow up. The patient has no access to video technology/ had technical  difficulties with video, requiring transitioning to audio format  only ( telephone ). All issues noted this document were discussed and addressed,no physical exam can be performed in this format.

## 2019-04-08 NOTE — Patient Instructions (Signed)
Annual physical exam in office with MD first week in Feb, call if you need me before  Please sched flu vaccine appt first Monday in October  Please change eating habits and food choice as sugarf has increased.  We will CALL RE CHOLESTEROL AND LIVER TESTS TO BE ADDED ON TODAY  Fasting  Lipid, cmp and eGFR, hBA1c cBC and vit D last week in January  It is important that you exercise regularly at least 30 minutes 5 times a week. If you develop chest pain, have severe difficulty breathing, or feel very tired, stop exercising immediately and seek medical attention    Social distancing. Frequent hand washing with soap and water Keeping your hands off of your face. These 3 practices will help to keep both you and your community healthy during this time. Please practice them faithfully!

## 2019-04-12 ENCOUNTER — Encounter: Payer: Self-pay | Admitting: Family Medicine

## 2019-04-12 NOTE — Assessment & Plan Note (Addendum)
Deteriorated, still only diet controlled Taylor Mitchell is reminded of the importance of commitment to daily physical activity for 30 minutes or more, as able and the Taylor Mitchell is reminded of the importance of daily foot exam, annual eye examination, and good blood sugar, blood pressure and cholesterol control.  Diabetic Labs Latest Ref Rng & Units 04/03/2019 09/02/2018 05/15/2018 03/28/2018 02/12/2018  HbA1c <5.7 % of total Hgb 7.0(H) 6.4(H) - 6.1(H) -  Chol <200 mg/dL 248(H) - 211(H) 213(H) 263(H)  HDL > OR = 50 mg/dL 57 - 45 38(L) 43  Calc LDL mg/dL (calc) 160(H) - 145(H) 153(H) 203(H)  Triglycerides <150 mg/dL 161(H) - 106 104 85  Creatinine 0.60 - 0.93 mg/dL 0.68 0.66 - 0.64 -   BP/Weight 04/08/2019 01/30/2019 10/07/2018 09/16/2018 09/02/2018 05/15/2018 0000000  Systolic BP [id number]  Diastolic BP 63 63 63 72 70 68 78  Wt. (Lbs) 180 169 169 163 163.04 160 164  BMI 29.05 27.28 27.28 26.31 26.32 25.82 26.47   Foot/eye exam completion dates Latest Ref Rng & Units 03/03/2019 12/10/2017  Eye Exam No Retinopathy No Retinopathy -  Foot Form Completion - - Done

## 2019-04-12 NOTE — Assessment & Plan Note (Signed)
Hyperlipidemia:Low fat diet discussed and encouraged.   Lipid Panel  Lab Results  Component Value Date   CHOL 248 (H) 04/03/2019   HDL 57 04/03/2019   LDLCALC 160 (H) 04/03/2019   TRIG 161 (H) 04/03/2019   CHOLHDL 4.4 04/03/2019   Deteriorated and uncontrolled, needs to change diet

## 2019-04-12 NOTE — Assessment & Plan Note (Signed)
  Patient re-educated about  the importance of commitment to a  minimum of 150 minutes of exercise per week as able.  The importance of healthy food choices with portion control discussed, as well as eating regularly and within a 12 hour window most days. The need to choose "clean , green" food 50 to 75% of the time is discussed, as well as to make water the primary drink and set a goal of 64 ounces water daily.    Weight /BMI 04/08/2019 01/30/2019 10/07/2018  WEIGHT 180 lb 169 lb 169 lb  HEIGHT 5\' 6"  5\' 6"  -  BMI 29.05 kg/m2 27.28 kg/m2 27.28 kg/m2

## 2019-05-04 DIAGNOSIS — M546 Pain in thoracic spine: Secondary | ICD-10-CM | POA: Diagnosis not present

## 2019-05-04 DIAGNOSIS — M545 Low back pain: Secondary | ICD-10-CM | POA: Diagnosis not present

## 2019-05-04 DIAGNOSIS — M9902 Segmental and somatic dysfunction of thoracic region: Secondary | ICD-10-CM | POA: Diagnosis not present

## 2019-05-04 DIAGNOSIS — M9905 Segmental and somatic dysfunction of pelvic region: Secondary | ICD-10-CM | POA: Diagnosis not present

## 2019-05-04 DIAGNOSIS — M9903 Segmental and somatic dysfunction of lumbar region: Secondary | ICD-10-CM | POA: Diagnosis not present

## 2019-05-18 ENCOUNTER — Ambulatory Visit (INDEPENDENT_AMBULATORY_CARE_PROVIDER_SITE_OTHER): Payer: Medicare Other

## 2019-05-18 ENCOUNTER — Other Ambulatory Visit: Payer: Self-pay

## 2019-05-18 DIAGNOSIS — Z23 Encounter for immunization: Secondary | ICD-10-CM

## 2019-06-26 ENCOUNTER — Telehealth (INDEPENDENT_AMBULATORY_CARE_PROVIDER_SITE_OTHER): Payer: Medicare Other | Admitting: Cardiology

## 2019-06-26 ENCOUNTER — Other Ambulatory Visit: Payer: Self-pay

## 2019-06-26 ENCOUNTER — Encounter: Payer: Self-pay | Admitting: Cardiology

## 2019-06-26 VITALS — BP 129/66 | HR 58 | Ht 66.0 in | Wt 178.0 lb

## 2019-06-26 DIAGNOSIS — E1169 Type 2 diabetes mellitus with other specified complication: Secondary | ICD-10-CM | POA: Diagnosis not present

## 2019-06-26 DIAGNOSIS — E782 Mixed hyperlipidemia: Secondary | ICD-10-CM | POA: Diagnosis not present

## 2019-06-26 DIAGNOSIS — E785 Hyperlipidemia, unspecified: Secondary | ICD-10-CM | POA: Diagnosis not present

## 2019-06-26 NOTE — Progress Notes (Signed)
Virtual Visit via Telephone Note   This visit type was conducted due to national recommendations for restrictions regarding the COVID-19 Pandemic (e.g. social distancing) in an effort to limit this patient's exposure and mitigate transmission in our community.  Due to her co-morbid illnesses, this patient is at least at moderate risk for complications without adequate follow up.  This format is felt to be most appropriate for this patient at this time.  The patient did not have access to video technology/had technical difficulties with video requiring transitioning to audio format only (telephone).  All issues noted in this document were discussed and addressed.  No physical exam could be performed with this format.  Please refer to the patient's chart for her  consent to telehealth for [facility].   Date:  06/26/2019   ID:  Taylor, Mitchell 16-Sep-1940, MRN [id number]  Patient Location: Home Provider Location: Office  PCP:  Fayrene Helper, MD  Cardiologist:  Rozann Lesches, MD Electrophysiologist:  None   Evaluation Performed:  Follow-Up Visit  Chief Complaint:  Cardiac follow-up  History of Present Illness:    Taylor Mitchell is a 78 y.o. female last seen in October 2019.  We spoke by phone today.  She does not report any major change in stamina, has gained some weight due to overall decreased activity during the pandemic.  She does not describe any exertional chest pain or palpitations.  We discussed arranging a follow-up lipid panel.  She plans to have this done next month with Dr. Moshe Cipro.  She has continued on Praluent.  Last LDL was 160.  The patient does not have symptoms concerning for COVID-19 infection (fever, chills, cough, or new shortness of breath).  She states that she wears a mask and has been social distancing.   Past Medical History:  Diagnosis Date  . Allergic rhinitis   . Anxiety   . Chronic back pain    Seeing chiropractor for almost 82 years, fell  off a tractor at age 59  . Diabetes mellitus, type 2 (HCC)    Prediabetic, diet controlled  . Dyspepsia   . Hearing loss   . Hyperlipidemia    Intolerant of all medication  . Peripheral neuropathy   . Stroke ([place]) 2015  . Vitamin D deficiency    Past Surgical History:  Procedure Laterality Date  . CATARACT EXTRACTION W/PHACO Left 06/04/2017   Procedure: CATARACT EXTRACTION PHACO AND INTRAOCULAR LENS PLACEMENT (IOC);  Surgeon: Rutherford Guys, MD;  Location: [facility];  Service: Ophthalmology;  Laterality: Left;  CDE: 3.54  . CATARACT EXTRACTION W/PHACO Right 06/25/2017   Procedure: CATARACT EXTRACTION PHACO AND INTRAOCULAR LENS PLACEMENT (IOC);  Surgeon: Rutherford Guys, MD;  Location: [facility];  Service: Ophthalmology;  Laterality: Right;  CDE: 7.86  . COLONOSCOPY N/A 02/21/2016   Procedure: COLONOSCOPY;  Surgeon: Aviva Signs, MD;  Location: [facility];  Service: Gastroenterology;  Laterality: N/A;  . INGUINAL HERNIA REPAIR Right 2004 approx  . NASAL SEPTUM SURGERY  1967  . OOPHORECTOMY Bilateral   . PARTIAL HYSTERECTOMY    . TONSILLECTOMY       Current Meds  Medication Sig  . Alirocumab (PRALUENT) 75 MG/ML SOAJ Inject 1 pen into the skin every 14 (fourteen) days.  Marland Kitchen aspirin (BAYER ASPIRIN EC LOW DOSE) 81 MG EC tablet Take 81 mg by mouth every other day.   . fluticasone (FLONASE) 50 MCG/ACT nasal spray Place 2 sprays into both nostrils daily as needed for allergies.   Marland Kitchen  loratadine (CLARITIN) 10 MG tablet Take 10 mg by mouth daily as needed for allergies.   . Misc Natural Products (WHITE WILLOW BARK PO) Take 400 mg by mouth daily as needed (for pain).   . mometasone (ELOCON) 0.1 % lotion Place 1 drop in affected ear (s0 twice daily as needed, for excessive itch  . Multiple Vitamins-Minerals (CENTRUM SILVER 50+WOMEN) TABS Take 1 tablet by mouth daily. When she remembers  . Omega-3 Fatty Acids (FISH OIL) 1200 MG CAPS Take 2,400 mg by mouth daily.     Allergies:   Statins, Metoprolol,  Pseudoephedrine, Repatha [evolocumab], and Corticosteroids   Social History   Tobacco Use  . Smoking status: Former Smoker    Packs/day: 1.00    Years: 20.00    Pack years: 20.00    Types: Cigarettes    Start date: 08/14/1955    Quit date: 08/14/1983    Years since quitting: 35.8  . Smokeless tobacco: Current User    Types: Snuff  Substance Use Topics  . Alcohol use: No    Alcohol/week: 0.0 standard drinks  . Drug use: No     Family Hx: The patient's family history includes Bladder Cancer in her father; Cirrhosis in her sister; Heart attack in her brother, sister, and sister; Kidney disease in her brother; Kidney failure in her brother; Ovarian cancer in her mother. There is no history of Breast cancer.  ROS:   Please see the history of present illness. All other systems reviewed and are negative.   Prior CV studies:   The following studies were reviewed today:  Lexiscan Myoview 10/18/2014: IMPRESSION: 1. No reversible ischemia or infarction.  2. Normal left ventricular wall motion.  3. Left ventricular ejection fraction 70%  4. Low-risk stress test findings*.  Labs/Other Tests and Data Reviewed:    EKG:  An ECG dated 06/03/2017 was personally reviewed today and demonstrated:  Sinus rhythm with PACs, left anterior fascicular block, decreased R wave progression with small R' in lead V1 and V2, nonspecific ST changes.  Recent Labs: 04/03/2019: ALT 10; BUN 14; Creat 0.68; Potassium 4.1; Sodium 142; TSH 3.76   Recent Lipid Panel Lab Results  Component Value Date/Time   CHOL 248 (H) 04/03/2019 07:49 AM   TRIG 161 (H) 04/03/2019 07:49 AM   HDL 57 04/03/2019 07:49 AM   CHOLHDL 4.4 04/03/2019 07:49 AM   LDLCALC 160 (H) 04/03/2019 07:49 AM    Wt Readings from Last 3 Encounters:  06/26/19 178 lb (80.7 kg)  04/08/19 180 lb (81.6 kg)  01/30/19 169 lb (76.7 kg)     Objective:    Vital Signs:  BP 129/66   Pulse (!) 58   Ht 5\' 6"  (1.676 m)   Wt 178 lb (80.7 kg)    BMI 28.73 kg/m    Patient spoke in full sentences, not short of breath. No audible wheezing or coughing. Speech pattern normal.  ASSESSMENT & PLAN:    1.  Severe hyperlipidemia.  She is currently on Praluent with pending follow-up FLP per Dr. Moshe Cipro in December.  She does not report any intolerances.  Her last LDL was 160.  2.  Type 2 diabetes mellitus, last hemoglobin A1c was 7%.  She is being managed via diet at this time.  Keep follow-up with Dr. Moshe Cipro.  COVID-19 Education: The signs and symptoms of COVID-19 were discussed with the patient and how to seek care for testing (follow up with PCP or arrange E-visit).  The importance of social distancing was  discussed today.  Time:   Today, I have spent 6 minutes with the patient with telehealth technology discussing the above problems.     Medication Adjustments/Labs and Tests Ordered: Current medicines are reviewed at length with the patient today.  Concerns regarding medicines are outlined above.   Tests Ordered: No orders of the defined types were placed in this encounter.   Medication Changes: No orders of the defined types were placed in this encounter.   Follow Up:  In Person 6 months in the [place] office.  Signed, Rozann Lesches, MD  06/26/2019 2:56 PM    [place]

## 2019-06-26 NOTE — Patient Instructions (Signed)
Medication Instructions:  Your physician recommends that you continue on your current medications as directed. Please refer to the Current Medication list given to you today.  *If you need a refill on your cardiac medications before your next appointment, please call your pharmacy*  Lab Work: None today If you have labs (blood work) drawn today and your tests are completely normal, you will receive your results only by: Marland Kitchen MyChart Message (if you have MyChart) OR . A paper copy in the mail If you have any lab test that is abnormal or we need to change your treatment, we will call you to review the results.  Testing/Procedures: None today  Follow-Up: At [facility], you and your health needs are our priority.  As part of our continuing mission to provide you with exceptional heart care, we have created designated Provider Care Teams.  These Care Teams include your primary Cardiologist (physician) and Advanced Practice Providers (APPs -  Physician Assistants and Nurse Practitioners) who all work together to provide you with the care you need, when you need it.  Your next appointment:   6 months  The format for your next appointment:   Office visit  Provider:   Rozann Lesches, MD  Other Instructions None    Thank you for choosing [facility] !

## 2019-07-01 DIAGNOSIS — M546 Pain in thoracic spine: Secondary | ICD-10-CM | POA: Diagnosis not present

## 2019-07-01 DIAGNOSIS — M9905 Segmental and somatic dysfunction of pelvic region: Secondary | ICD-10-CM | POA: Diagnosis not present

## 2019-07-01 DIAGNOSIS — M9903 Segmental and somatic dysfunction of lumbar region: Secondary | ICD-10-CM | POA: Diagnosis not present

## 2019-07-01 DIAGNOSIS — M545 Low back pain: Secondary | ICD-10-CM | POA: Diagnosis not present

## 2019-07-01 DIAGNOSIS — M9902 Segmental and somatic dysfunction of thoracic region: Secondary | ICD-10-CM | POA: Diagnosis not present

## 2019-07-16 ENCOUNTER — Ambulatory Visit: Payer: Medicare Other | Admitting: Cardiology

## 2019-07-31 DIAGNOSIS — M9903 Segmental and somatic dysfunction of lumbar region: Secondary | ICD-10-CM | POA: Diagnosis not present

## 2019-07-31 DIAGNOSIS — M9902 Segmental and somatic dysfunction of thoracic region: Secondary | ICD-10-CM | POA: Diagnosis not present

## 2019-07-31 DIAGNOSIS — M9905 Segmental and somatic dysfunction of pelvic region: Secondary | ICD-10-CM | POA: Diagnosis not present

## 2019-07-31 DIAGNOSIS — M546 Pain in thoracic spine: Secondary | ICD-10-CM | POA: Diagnosis not present

## 2019-07-31 DIAGNOSIS — M545 Low back pain: Secondary | ICD-10-CM | POA: Diagnosis not present

## 2019-08-26 ENCOUNTER — Telehealth: Payer: Self-pay | Admitting: Cardiology

## 2019-08-26 NOTE — Telephone Encounter (Signed)
Patient is going to check with her pcp.

## 2019-08-26 NOTE — Telephone Encounter (Signed)
Would like to know if it's safe for her to have the COVID vaccine since she has allergies to some medications

## 2019-09-01 ENCOUNTER — Other Ambulatory Visit: Payer: Self-pay

## 2019-09-01 ENCOUNTER — Ambulatory Visit (INDEPENDENT_AMBULATORY_CARE_PROVIDER_SITE_OTHER): Payer: Medicare Other | Admitting: Family Medicine

## 2019-09-01 ENCOUNTER — Encounter: Payer: Self-pay | Admitting: Family Medicine

## 2019-09-01 VITALS — BP 130/70 | Ht 66.0 in | Wt 178.0 lb

## 2019-09-01 DIAGNOSIS — E1169 Type 2 diabetes mellitus with other specified complication: Secondary | ICD-10-CM

## 2019-09-01 DIAGNOSIS — E785 Hyperlipidemia, unspecified: Secondary | ICD-10-CM

## 2019-09-01 DIAGNOSIS — E663 Overweight: Secondary | ICD-10-CM | POA: Diagnosis not present

## 2019-09-01 DIAGNOSIS — F411 Generalized anxiety disorder: Secondary | ICD-10-CM

## 2019-09-01 MED ORDER — ALPRAZOLAM 0.25 MG PO TABS: ORAL_TABLET | ORAL | 0 refills | Status: DC

## 2019-09-01 NOTE — Progress Notes (Signed)
Virtual Visit via Telephone Note  I connected with Taylor Mitchell on 09/01/19 at  3:20 PM EST by telephone and verified that I am speaking with the correct person using two identifiers.  Location: Patient: home Provider: office   I discussed the limitations, risks, security and privacy concerns of performing an evaluation and management service by telephone and the availability of in person appointments. I also discussed with the patient that there may be a patient responsible charge related to this service. The patient expressed understanding and agreed to proceed.   History of Present Illness: Patient c/o elevated and uncontrolled blood sugar in past 2 to 3 weeks,does not take medication. Also  C/o  uncontrolled anxiety, she is requesting limited xanax as the ones she has have expired, just for panic Denies recent fever or chills. Denies sinus pressure, nasal congestion, ear pain or sore throat. Denies chest congestion, productive cough or wheezing. Denies chest pains, palpitations and leg swelling Denies abdominal pain, nausea, vomiting,diarrhea or constipation.   Denies dysuria, frequency, hesitancy or incontinence. Denies uncontrolled  joint pain, swelling and limitation in mobility. Denies headaches, seizures, numbness, or tingling. Denies skin break down or rash.        Observations/Objective: BP 130/70   Ht 5\' 6"  (1.676 m)   Wt 178 lb (80.7 kg)   BMI 28.73 kg/m  Good communication with no confusion and intact memory. Alert and oriented x 3 No signs of respiratory distress during speech    Assessment and Plan: Type 2 diabetes mellitus with other specified complication ([place]) Taylor Mitchell is reminded of the importance of commitment to daily physical activity for 30 minutes or more, as able and the need to limit carbohydrate intake to 30 to 60 grams per meal to help with blood sugar control.   The need to take medication as prescribed, test blood sugar as directed, and  to call between visits if there is a concern that blood sugar is uncontrolled is also discussed.   Taylor Mitchell is reminded of the importance of daily foot exam, annual eye examination, and good blood sugar, blood pressure and cholesterol control. Improved, which is reassuring and she I applauded  Diabetic Labs Latest Ref Rng & Units 09/02/2019 04/03/2019 09/02/2018 05/15/2018 03/28/2018  HbA1c <5.7 % of total Hgb 6.9(H) 7.0(H) 6.4(H) - 6.1(H)  Chol <200 mg/dL 230(H) 248(H) - 211(H) 213(H)  HDL > OR = 50 mg/dL 52 57 - 45 38(L)  Calc LDL mg/dL (calc) 158(H) 160(H) - 145(H) 153(H)  Triglycerides <150 mg/dL 91 161(H) - 106 104  Creatinine 0.60 - 0.93 mg/dL 0.79 0.68 0.66 - 0.64   BP/Weight 09/01/2019 06/26/2019 04/08/2019 01/30/2019 10/07/2018 09/16/2018 [id number]  Systolic BP [id number] AB-123456789 AB-123456789 AB-123456789 [id number]  Diastolic BP 70 66 63 63 63 72 70  Wt. (Lbs) 178 178 180 169 169 163 163.04  BMI 28.73 28.73 29.05 27.28 27.28 26.31 26.32   Foot/eye exam completion dates Latest Ref Rng & Units 03/03/2019 12/10/2017  Eye Exam No Retinopathy No Retinopathy -  Foot Form Completion - - Done        Hyperlipidemia associated with type 2 diabetes mellitus ([place]) Hyperlipidemia:Low fat diet discussed and encouraged.   Lipid Panel  Lab Results  Component Value Date   CHOL 230 (H) 09/02/2019   HDL 52 09/02/2019   LDLCALC 158 (H) 09/02/2019   TRIG 91 09/02/2019   CHOLHDL 4.4 09/02/2019  not at  Goal , needs to lower fat intake  GAD (generalized anxiety disorder) Increased opanic episodes, will prescribe limited quantity of xanax  Overweight (BMI 25.0-29.9)  Patient re-educated about  the importance of commitment to a  minimum of 150 minutes of exercise per week as able.  The importance of healthy food choices with portion control discussed, as well as eating regularly and within a 12 hour window most days. The need to choose "clean , green" food 50 to 75% of the time is discussed, as well as to make  water the primary drink and set a goal of 64 ounces water daily.    Weight /BMI 09/01/2019 06/26/2019 04/08/2019  WEIGHT 178 lb 178 lb 180 lb  HEIGHT 5\' 6"  5\' 6"  5\' 6"   BMI 28.73 kg/m2 28.73 kg/m2 29.05 kg/m2        Follow Up Instructions:    I discussed the assessment and treatment plan with the patient. The patient was provided an opportunity to ask questions and all were answered. The patient agreed with the plan and demonstrated an understanding of the instructions.   The patient was advised to call back or seek an in-person evaluation if the symptoms worsen or if the condition fails to improve as anticipated.  I provided 12 minutes of non-face-to-face time during this encounter.   Tula Nakayama, MD

## 2019-09-01 NOTE — Patient Instructions (Addendum)
F/U in office as bfore , call if you need me sooner  Please get labs ordered tomorrow  Limited amount of xanax has been prescribed for use only if needed  Please limit alll anxiety provoking activity to the minimum  Thanks for choosing [facility], we consider it a privelige to serve you.

## 2019-09-02 DIAGNOSIS — E1169 Type 2 diabetes mellitus with other specified complication: Secondary | ICD-10-CM | POA: Diagnosis not present

## 2019-09-02 DIAGNOSIS — E785 Hyperlipidemia, unspecified: Secondary | ICD-10-CM | POA: Diagnosis not present

## 2019-09-02 DIAGNOSIS — E559 Vitamin D deficiency, unspecified: Secondary | ICD-10-CM | POA: Diagnosis not present

## 2019-09-03 LAB — CBC
HCT: 42.9 % (ref 35.0–45.0)
Hemoglobin: 14 g/dL (ref 11.7–15.5)
MCH: 30.2 pg (ref 27.0–33.0)
MCHC: 32.6 g/dL (ref 32.0–36.0)
MCV: 92.7 fL (ref 80.0–100.0)
MPV: 10.8 fL (ref 7.5–12.5)
Platelets: 244 10*3/uL (ref 140–400)
RBC: 4.63 10*6/uL (ref 3.80–5.10)
RDW: 12.5 % (ref 11.0–15.0)
WBC: 6.8 10*3/uL (ref 3.8–10.8)

## 2019-09-03 LAB — COMPLETE METABOLIC PANEL WITH GFR
AG Ratio: 1.7 (calc) (ref 1.0–2.5)
ALT: 13 U/L (ref 6–29)
AST: 15 U/L (ref 10–35)
Albumin: 4.3 g/dL (ref 3.6–5.1)
Alkaline phosphatase (APISO): 54 U/L (ref 37–153)
BUN: 23 mg/dL (ref 7–25)
CO2: 25 mmol/L (ref 20–32)
Calcium: 10 mg/dL (ref 8.6–10.4)
Chloride: 104 mmol/L (ref 98–110)
Creat: 0.79 mg/dL (ref 0.60–0.93)
GFR, Est African American: 84 mL/min/{1.73_m2} (ref >=60)
GFR, Est Non African American: 73 mL/min/{1.73_m2} (ref >=60)
Globulin: 2.6 g/dL (calc) (ref 1.9–3.7)
Glucose, Bld: 145 mg/dL — ABNORMAL HIGH (ref 65–99)
Potassium: 4.2 mmol/L (ref 3.5–5.3)
Sodium: 138 mmol/L (ref 135–146)
Total Bilirubin: 0.5 mg/dL (ref 0.2–1.2)
Total Protein: 6.9 g/dL (ref 6.1–8.1)

## 2019-09-03 LAB — LIPID PANEL
Cholesterol: 230 mg/dL — ABNORMAL HIGH (ref <200)
HDL: 52 mg/dL (ref >=50)
LDL Cholesterol (Calc): 158 mg/dL (calc) — ABNORMAL HIGH
Non-HDL Cholesterol (Calc): 178 mg/dL (calc) — ABNORMAL HIGH (ref <130)
Total CHOL/HDL Ratio: 4.4 (calc) (ref <5.0)
Triglycerides: 91 mg/dL (ref <150)

## 2019-09-03 LAB — HEMOGLOBIN A1C
Hgb A1c MFr Bld: 6.9 % of total Hgb — ABNORMAL HIGH (ref <5.7)
Mean Plasma Glucose: 151 (calc)
eAG (mmol/L): 8.4 (calc)

## 2019-09-03 LAB — VITAMIN D 25 HYDROXY (VIT D DEFICIENCY, FRACTURES): Vit D, 25-Hydroxy: 21 ng/mL — ABNORMAL LOW (ref 30–100)

## 2019-09-13 NOTE — Assessment & Plan Note (Signed)
Increased opanic episodes, will prescribe limited quantity of xanax

## 2019-09-13 NOTE — Assessment & Plan Note (Signed)
Hyperlipidemia:Low fat diet discussed and encouraged.   Lipid Panel  Lab Results  Component Value Date   CHOL 230 (H) 09/02/2019   HDL 52 09/02/2019   LDLCALC 158 (H) 09/02/2019   TRIG 91 09/02/2019   CHOLHDL 4.4 09/02/2019  not at  Goal , needs to lower fat intake

## 2019-09-13 NOTE — Assessment & Plan Note (Signed)
Ms. Duthie is reminded of the importance of commitment to daily physical activity for 30 minutes or more, as able and the need to limit carbohydrate intake to 30 to 60 grams per meal to help with blood sugar control.   The need to take medication as prescribed, test blood sugar as directed, and to call between visits if there is a concern that blood sugar is uncontrolled is also discussed.   Ms. Fossett is reminded of the importance of daily foot exam, annual eye examination, and good blood sugar, blood pressure and cholesterol control. Improved, which is reassuring and she I applauded  Diabetic Labs Latest Ref Rng & Units 09/02/2019 04/03/2019 09/02/2018 05/15/2018 03/28/2018  HbA1c <5.7 % of total Hgb 6.9(H) 7.0(H) 6.4(H) - 6.1(H)  Chol <200 mg/dL 230(H) 248(H) - 211(H) 213(H)  HDL > OR = 50 mg/dL 52 57 - 45 38(L)  Calc LDL mg/dL (calc) 158(H) 160(H) - 145(H) 153(H)  Triglycerides <150 mg/dL 91 161(H) - 106 104  Creatinine 0.60 - 0.93 mg/dL 0.79 0.68 0.66 - 0.64   BP/Weight 09/01/2019 06/26/2019 04/08/2019 01/30/2019 10/07/2018 09/16/2018 123456  Systolic BP [id number] AB-123456789 AB-123456789 AB-123456789 [id number] 123456  Diastolic BP 70 66 63 63 63 72 70  Wt. (Lbs) 178 178 180 169 169 163 163.04  BMI 28.73 28.73 29.05 27.28 27.28 26.31 26.32   Foot/eye exam completion dates Latest Ref Rng & Units 03/03/2019 12/10/2017  Eye Exam No Retinopathy No Retinopathy -  Foot Form Completion - - Done

## 2019-09-13 NOTE — Assessment & Plan Note (Signed)
  Patient re-educated about  the importance of commitment to a  minimum of 150 minutes of exercise per week as able.  The importance of healthy food choices with portion control discussed, as well as eating regularly and within a 12 hour window most days. The need to choose "clean , green" food 50 to 75% of the time is discussed, as well as to make water the primary drink and set a goal of 64 ounces water daily.    Weight /BMI 09/01/2019 06/26/2019 04/08/2019  WEIGHT 178 lb 178 lb 180 lb  HEIGHT 5\' 6"  5\' 6"  5\' 6"   BMI 28.73 kg/m2 28.73 kg/m2 29.05 kg/m2

## 2019-09-16 ENCOUNTER — Other Ambulatory Visit: Payer: Self-pay

## 2019-09-16 ENCOUNTER — Ambulatory Visit (INDEPENDENT_AMBULATORY_CARE_PROVIDER_SITE_OTHER): Payer: Medicare Other | Admitting: Family Medicine

## 2019-09-16 ENCOUNTER — Telehealth: Payer: Self-pay

## 2019-09-16 ENCOUNTER — Encounter: Payer: Self-pay | Admitting: Family Medicine

## 2019-09-16 VITALS — BP 128/66 | HR 61 | Temp 99.1°F | Ht 66.0 in | Wt 182.2 lb

## 2019-09-16 DIAGNOSIS — E785 Hyperlipidemia, unspecified: Secondary | ICD-10-CM | POA: Diagnosis not present

## 2019-09-16 DIAGNOSIS — Z0000 Encounter for general adult medical examination without abnormal findings: Secondary | ICD-10-CM

## 2019-09-16 DIAGNOSIS — E1169 Type 2 diabetes mellitus with other specified complication: Secondary | ICD-10-CM

## 2019-09-16 DIAGNOSIS — R928 Other abnormal and inconclusive findings on diagnostic imaging of breast: Secondary | ICD-10-CM

## 2019-09-16 NOTE — Assessment & Plan Note (Signed)
Annual exam as documented. Counseling done  re healthy lifestyle involving commitment to 150 minutes exercise per week, heart healthy diet, and attaining healthy weight.The importance of adequate sleep also discussed. Changes in health habits are decided on by the patient with goals and time frames  set for achieving them. Immunization and cancer screening needs are specifically addressed at this visit. 

## 2019-09-16 NOTE — Patient Instructions (Addendum)
F/U in office with MD in 5 month, call if you need me before  CONGRATS on improved blood sugar and cholesterol  Microalb from office today ( if able)   Please schedule June mammogram in [place] due in June, bilateral digital diagnostic and left breast US ( see last reprot for correct order please)  Fasting lipid, cmp and EGFr, hBA1C  1 week before next visit  Start vit D3 , 1000 IU every day, continue 1 multivitamin daily  It is important that you exercise regularly at least 30 minutes 5 times a week. If you develop chest pain, have severe difficulty breathing, or feel very tired, stop exercising immediately and seek medical attention    Fat and Cholesterol Restricted Eating Plan Getting too much fat and cholesterol in your diet may cause health problems. Choosing the right foods helps keep your fat and cholesterol at normal levels. This can keep you from getting certain diseases. Your doctor may recommend an eating plan that includes:  Total fat: ______% or less of total calories a day.  Saturated fat: ______% or less of total calories a day.  Cholesterol: less than _________mg a day.  Fiber: ______g a day. What are tips for following this plan? Meal planning  At meals, divide your plate into four equal parts: ? Fill one-half of your plate with vegetables and green salads. ? Fill one-fourth of your plate with whole grains. ? Fill one-fourth of your plate with low-fat (lean) protein foods.  Eat fish that is high in omega-3 fats at least two times a week. This includes mackerel, tuna, sardines, and salmon.  Eat foods that are high in fiber, such as whole grains, beans, apples, broccoli, carrots, peas, and barley. General tips   Work with your doctor to lose weight if you need to.  Avoid: ? Foods with added sugar. ? Fried foods. ? Foods with partially hydrogenated oils.  Limit alcohol intake to no more than 1 drink a day for nonpregnant women and 2 drinks a day for  men. One drink equals 12 oz of beer, 5 oz of wine, or 1 oz of hard liquor. Reading food labels  Check food labels for: ? Trans fats. ? Partially hydrogenated oils. ? Saturated fat (g) in each serving. ? Cholesterol (mg) in each serving. ? Fiber (g) in each serving.  Choose foods with healthy fats, such as: ? Monounsaturated fats. ? Polyunsaturated fats. ? Omega-3 fats.  Choose grain products that have whole grains. Look for the word "whole" as the first word in the ingredient list. Cooking  Cook foods using low-fat methods. These include baking, boiling, grilling, and broiling.  Eat more home-cooked foods. Eat at restaurants and buffets less often.  Avoid cooking using saturated fats, such as butter, cream, palm oil, palm kernel oil, and coconut oil. Recommended foods  Fruits  All fresh, canned (in natural juice), or frozen fruits. Vegetables  Fresh or frozen vegetables (raw, steamed, roasted, or grilled). Green salads. Grains  Whole grains, such as whole wheat or whole grain breads, crackers, cereals, and pasta. Unsweetened oatmeal, bulgur, barley, quinoa, or brown rice. Corn or whole wheat flour tortillas. Meats and other protein foods  Ground beef (85% or leaner), grass-fed beef, or beef trimmed of fat. Skinless chicken or [place]. Ground chicken or [place]. Pork trimmed of fat. All fish and seafood. Egg whites. Dried beans, peas, or lentils. Unsalted nuts or seeds. Unsalted canned beans. Nut butters without added sugar or oil. Dairy  Low-fat or nonfat dairy  products, such as skim or 1% milk, 2% or reduced-fat cheeses, low-fat and fat-free ricotta or cottage cheese, or plain low-fat and nonfat yogurt. Fats and oils  Tub margarine without trans fats. Light or reduced-fat mayonnaise and salad dressings. Avocado. Olive, canola, sesame, or safflower oils. The items listed above may not be a complete list of foods and beverages you can eat. Contact a dietitian for more  information. Foods to avoid Fruits  Canned fruit in heavy syrup. Fruit in cream or butter sauce. Fried fruit. Vegetables  Vegetables cooked in cheese, cream, or butter sauce. Fried vegetables. Grains  White bread. White pasta. White rice. Cornbread. Bagels, pastries, and croissants. Crackers and snack foods that contain trans fat and hydrogenated oils. Meats and other protein foods  Fatty cuts of meat. Ribs, chicken wings, bacon, sausage, bologna, salami, chitterlings, fatback, hot dogs, bratwurst, and packaged lunch meats. Liver and organ meats. Whole eggs and egg yolks. Chicken and turkey with skin. Fried meat. Dairy  Whole or 2% milk, cream, half-and-half, and cream cheese. Whole milk cheeses. Whole-fat or sweetened yogurt. Full-fat cheeses. Nondairy creamers and whipped toppings. Processed cheese, cheese spreads, and cheese curds. Beverages  Alcohol. Sugar-sweetened drinks such as sodas, lemonade, and fruit drinks. Fats and oils  Butter, stick margarine, lard, shortening, ghee, or bacon fat. Coconut, palm kernel, and palm oils. Sweets and desserts  Corn syrup, sugars, honey, and molasses. Candy. Jam and jelly. Syrup. Sweetened cereals. Cookies, pies, cakes, donuts, muffins, and ice cream. The items listed above may not be a complete list of foods and beverages you should avoid. Contact a dietitian for more information. Summary  Choosing the right foods helps keep your fat and cholesterol at normal levels. This can keep you from getting certain diseases.  At meals, fill one-half of your plate with vegetables and green salads.  Eat high-fiber foods, like whole grains, beans, apples, carrots, peas, and barley.  Limit added sugar, saturated fats, alcohol, and fried foods. This information is not intended to replace advice given to you by your health care provider. Make sure you discuss any questions you have with your health care provider. Document Revised: 04/02/2018 Document  Reviewed: 04/16/2017 Elsevier Patient Education  2020 [employer]  

## 2019-09-16 NOTE — Telephone Encounter (Signed)
[place] at [facility] for Mammogram  This is already scheduled -just an FYI to you

## 2019-09-16 NOTE — Progress Notes (Signed)
    Taylor Mitchell     MRN: [id number]      DOB: 10/15/1940  HPI: Patient is in for annual physical exam. Reports intolerance to statin medication and has opted to delay current dose Recent labs,  are reviewed. Immunization is reviewed , and is up to date   PE: BP 128/66 (BP Location: Left Arm, Patient Position: Sitting)   Pulse 61   Temp 99.1 F (37.3 C) (Temporal)   Ht 5\' 6"  (1.676 m)   Wt 182 lb 3.2 oz (82.6 kg)   SpO2 96%   BMI 29.41 kg/m    Pleasant  female, alert and oriented x 3, in no cardio-pulmonary distress. Afebrile. HEENT No facial trauma or asymetry. Sinuses non tender.  Extra occullar muscles intact.. External ears normal, . Neck: supple, no adenopathy,JVD or thyromegaly.No bruits.  Chest: Clear to ascultation bilaterally.No crackles or wheezes. Non tender to palpation  Breast: No asymetry,no masses or lumps. No tenderness. No nipple discharge or inversion. No axillary or supraclavicular adenopathy  Cardiovascular system; Heart sounds normal,  S1 and  S2 ,no S3.  No murmur, or thrill. Apical beat not displaced Peripheral pulses normal.  Abdomen: Soft, non tender, no organomegaly or masses. No bruits. Bowel sounds normal. No guarding, tenderness or rebound.    Musculoskeletal exam: Full ROM of spine, hips , shoulders and knees. No deformity ,swelling or crepitus noted. No muscle wasting or atrophy.   Neurologic: Cranial nerves 2 to 12 intact. Power, tone ,sensation and reflexes normal throughout. No disturbance in gait. No tremor.  Skin: Intact, no ulceration, erythema , scaling or rash noted. Pigmentation normal throughout  Psych; Normal mood and affect. Judgement and concentration normal   Assessment & Plan:  Annual physical exam Annual exam as documented. Counseling done  re healthy lifestyle involving commitment to 150 minutes exercise per week, heart healthy diet, and attaining healthy weight.The importance of adequate sleep  also discussed.  Changes in health habits are decided on by the patient with goals and time frames  set for achieving them. Immunization and cancer screening needs are specifically addressed at this visit.

## 2019-10-02 DIAGNOSIS — M9903 Segmental and somatic dysfunction of lumbar region: Secondary | ICD-10-CM | POA: Diagnosis not present

## 2019-10-02 DIAGNOSIS — M9902 Segmental and somatic dysfunction of thoracic region: Secondary | ICD-10-CM | POA: Diagnosis not present

## 2019-10-02 DIAGNOSIS — M545 Low back pain: Secondary | ICD-10-CM | POA: Diagnosis not present

## 2019-10-02 DIAGNOSIS — M9905 Segmental and somatic dysfunction of pelvic region: Secondary | ICD-10-CM | POA: Diagnosis not present

## 2019-10-02 DIAGNOSIS — M546 Pain in thoracic spine: Secondary | ICD-10-CM | POA: Diagnosis not present

## 2019-10-16 DIAGNOSIS — M546 Pain in thoracic spine: Secondary | ICD-10-CM | POA: Diagnosis not present

## 2019-10-16 DIAGNOSIS — M545 Low back pain: Secondary | ICD-10-CM | POA: Diagnosis not present

## 2019-10-16 DIAGNOSIS — M9903 Segmental and somatic dysfunction of lumbar region: Secondary | ICD-10-CM | POA: Diagnosis not present

## 2019-10-16 DIAGNOSIS — M9902 Segmental and somatic dysfunction of thoracic region: Secondary | ICD-10-CM | POA: Diagnosis not present

## 2019-10-16 DIAGNOSIS — M9905 Segmental and somatic dysfunction of pelvic region: Secondary | ICD-10-CM | POA: Diagnosis not present

## 2019-11-18 DIAGNOSIS — M546 Pain in thoracic spine: Secondary | ICD-10-CM | POA: Diagnosis not present

## 2019-11-18 DIAGNOSIS — M545 Low back pain: Secondary | ICD-10-CM | POA: Diagnosis not present

## 2019-11-18 DIAGNOSIS — M9905 Segmental and somatic dysfunction of pelvic region: Secondary | ICD-10-CM | POA: Diagnosis not present

## 2019-11-18 DIAGNOSIS — M9903 Segmental and somatic dysfunction of lumbar region: Secondary | ICD-10-CM | POA: Diagnosis not present

## 2019-11-18 DIAGNOSIS — M9902 Segmental and somatic dysfunction of thoracic region: Secondary | ICD-10-CM | POA: Diagnosis not present

## 2019-12-27 NOTE — Progress Notes (Signed)
Cardiology Office Note  Date: 12/28/2019   ID: Siena, Munsterman 08-10-1941, MRN [id number]  PCP:  Fayrene Helper, MD  Cardiologist:  Rozann Lesches, MD Electrophysiologist:  None   Chief Complaint  Patient presents with  . Cardiac follow-up    History of Present Illness: Taylor Mitchell is a 79 y.o. female last assessed via telehealth encounter in November 2020.  She presents for a follow-up visit.  She had been on Praluent with prior intolerance to lipid-lowering medications including statins and Zetia.  She states that she started to experience symptoms on Praluent including anxiousness, palpitations, and shortness of breath.  She stopped taking it and states that the symptoms resolved.  LDL was 158 in January of this year.  I am not certain about whether she was compliant with Praluent at that time or not.  Today I talked with her about dietary measures to consider in terms of lipid lowering, other potential options related to medical therapy, and also evaluation in our lipid clinic.  She in general has been hesitant to take any medications for her cholesterol, and states that she prefers managing it via diet at this point.  Past Medical History:  Diagnosis Date  . Allergic rhinitis   . Anxiety   . Chronic back pain    Seeing chiropractor for almost 20 years, fell off a tractor at age 61  . Diabetes mellitus, type 2 (HCC)    Prediabetic, diet controlled  . Dyspepsia   . Hearing loss   . Hyperlipidemia    Intolerant of all medication  . Pain and swelling of knee, left 09/02/2018  . Peripheral neuropathy   . Stroke ([place]) 2015  . Vitamin D deficiency     Past Surgical History:  Procedure Laterality Date  . CATARACT EXTRACTION W/PHACO Left 06/04/2017   Procedure: CATARACT EXTRACTION PHACO AND INTRAOCULAR LENS PLACEMENT (IOC);  Surgeon: Rutherford Guys, MD;  Location: [facility];  Service: Ophthalmology;  Laterality: Left;  CDE: 3.54  . CATARACT EXTRACTION W/PHACO Right  06/25/2017   Procedure: CATARACT EXTRACTION PHACO AND INTRAOCULAR LENS PLACEMENT (IOC);  Surgeon: Rutherford Guys, MD;  Location: [facility];  Service: Ophthalmology;  Laterality: Right;  CDE: 7.86  . COLONOSCOPY N/A 02/21/2016   Procedure: COLONOSCOPY;  Surgeon: Aviva Signs, MD;  Location: [facility];  Service: Gastroenterology;  Laterality: N/A;  . INGUINAL HERNIA REPAIR Right 2004 approx  . NASAL SEPTUM SURGERY  1967  . OOPHORECTOMY Bilateral   . PARTIAL HYSTERECTOMY    . TONSILLECTOMY      Current Outpatient Medications  Medication Sig Dispense Refill  . ALPRAZolam (XANAX) 0.25 MG tablet Take one half to one tablet three times weekly, as needed, for anxiety 20 tablet 0  . aspirin (BAYER ASPIRIN EC LOW DOSE) 81 MG EC tablet Take 81 mg by mouth every other day.     . fluticasone (FLONASE) 50 MCG/ACT nasal spray Place 2 sprays into both nostrils daily as needed for allergies.     Marland Kitchen loratadine (CLARITIN) 10 MG tablet Take 10 mg by mouth daily as needed for allergies.     . Misc Natural Products (WHITE WILLOW BARK PO) Take 400 mg by mouth daily as needed (for pain).     . mometasone (ELOCON) 0.1 % lotion Place 1 drop in affected ear (s0 twice daily as needed, for excessive itch 30 mL 1  . Multiple Vitamins-Minerals (CENTRUM SILVER 50+WOMEN) TABS Take 1 tablet by mouth daily. When she remembers    .  Omega-3 Fatty Acids (FISH OIL) 1200 MG CAPS Take 2,400 mg by mouth daily.     No current facility-administered medications for this visit.   Allergies:  Statins, Metoprolol, Pseudoephedrine, Repatha [evolocumab], and Corticosteroids   ROS:   Venous stasis.  Physical Exam: VS:  BP 126/72   Pulse 70   Temp 97.9 F (36.6 C)   Ht 5\' 6"  (1.676 m)   Wt 186 lb (84.4 kg)   SpO2 96%   BMI 30.02 kg/m , BMI Body mass index is 30.02 kg/m.  Wt Readings from Last 3 Encounters:  12/28/19 186 lb (84.4 kg)  09/16/19 182 lb 3.2 oz (82.6 kg)  09/01/19 178 lb (80.7 kg)    General:  Elderly woman,  appears comfortable at rest. HEENT: Conjunctiva and lids normal, wearing a mask. Neck: Supple, no elevated JVP or carotid bruits, no thyromegaly. Lungs: Clear to auscultation, nonlabored breathing at rest. Cardiac: Regular rate and rhythm, no S3 or significant systolic murmur. Extremities: Venous stasis, distal pulses 2+.  ECG:  An ECG dated 06/03/2017 was personally reviewed today and demonstrated:  Sinus rhythm with PACs, left anterior fascicular block, decreased R wave progression with small R' in lead VI and V2, nonspecific ST changes.  Recent Labwork: 04/03/2019: TSH 3.76 09/02/2019: ALT 13; AST 15; BUN 23; Creat 0.79; Hemoglobin 14.0; Platelets 244; Potassium 4.2; Sodium 138     Component Value Date/Time   CHOL 230 (H) 09/02/2019 0757   TRIG 91 09/02/2019 0757   HDL 52 09/02/2019 0757   CHOLHDL 4.4 09/02/2019 0757   VLDL 21 05/15/2018 1009   LDLCALC 158 (H) 09/02/2019 0757    Other Studies Reviewed Today:  Lexiscan Myoview 10/18/2014: IMPRESSION: 1. No reversible ischemia or infarction.  2. Normal left ventricular wall motion.  3. Left ventricular ejection fraction 70%  4. Low-risk stress test findings*.  Assessment and Plan:  1.  Mixed hyperlipidemia, history of severely elevated lipids with LDL 221, down to 158 while on Praluent, however she reported intolerance as discussed above and has decided to stop the medication.  She has prior intolerances to statins and Zetia.  I did discuss other options with her including referral to the lipid clinic, but for now she stated that she wanted to focus on her diet.  She will keep follow-up with Dr. Moshe Cipro going forward.  2.  History of palpitations, presently quiescent.  Heart rate regular today.  Medication Adjustments/Labs and Tests Ordered: Current medicines are reviewed at length with the patient today.  Concerns regarding medicines are outlined above.   Tests Ordered: No orders of the defined types were placed in this  encounter.   Medication Changes: No orders of the defined types were placed in this encounter.   Disposition:  Follow up with Dr. Moshe Cipro.  Signed, Satira Sark, MD, [facility] 12/28/2019 2:38 PM    [place] [facility] [address] [place], [place] [address] Phone: [phone]; Fax: [phone]

## 2019-12-28 ENCOUNTER — Other Ambulatory Visit: Payer: Self-pay

## 2019-12-28 ENCOUNTER — Encounter: Payer: Self-pay | Admitting: Cardiology

## 2019-12-28 ENCOUNTER — Ambulatory Visit: Payer: Medicare Other | Admitting: Cardiology

## 2019-12-28 VITALS — BP 126/72 | HR 70 | Temp 97.9°F | Ht 66.0 in | Wt 186.0 lb

## 2019-12-28 DIAGNOSIS — E782 Mixed hyperlipidemia: Secondary | ICD-10-CM

## 2019-12-28 NOTE — Patient Instructions (Signed)
Medication Instructions:  Your physician recommends that you continue on your current medications as directed. Please refer to the Current Medication list given to you today.  *If you need a refill on your cardiac medications before your next appointment, please call your pharmacy*   Lab Work: None today If you have labs (blood work) drawn today and your tests are completely normal, you will receive your results only by: Marland Kitchen MyChart Message (if you have MyChart) OR . A paper copy in the mail If you have any lab test that is abnormal or we need to change your treatment, we will call you to review the results.   Testing/Procedures: None today   Follow-Up: as needed with Dr.McDowell      Thank you for choosing [facility] !

## 2020-01-04 DIAGNOSIS — M546 Pain in thoracic spine: Secondary | ICD-10-CM | POA: Diagnosis not present

## 2020-01-04 DIAGNOSIS — M545 Low back pain: Secondary | ICD-10-CM | POA: Diagnosis not present

## 2020-01-04 DIAGNOSIS — M9905 Segmental and somatic dysfunction of pelvic region: Secondary | ICD-10-CM | POA: Diagnosis not present

## 2020-01-04 DIAGNOSIS — M9902 Segmental and somatic dysfunction of thoracic region: Secondary | ICD-10-CM | POA: Diagnosis not present

## 2020-01-04 DIAGNOSIS — M9903 Segmental and somatic dysfunction of lumbar region: Secondary | ICD-10-CM | POA: Diagnosis not present

## 2020-01-26 ENCOUNTER — Ambulatory Visit
Admission: RE | Admit: 2020-01-26 | Discharge: 2020-01-26 | Disposition: A | Discharge status: Home / Self Care | Payer: Medicare Other | Source: Ambulatory Visit | Attending: Family Medicine | Admitting: Family Medicine

## 2020-01-26 DIAGNOSIS — N6012 Diffuse cystic mastopathy of left breast: Secondary | ICD-10-CM | POA: Diagnosis not present

## 2020-01-26 DIAGNOSIS — R928 Other abnormal and inconclusive findings on diagnostic imaging of breast: Secondary | ICD-10-CM | POA: Insufficient documentation

## 2020-01-26 DIAGNOSIS — R922 Inconclusive mammogram: Secondary | ICD-10-CM | POA: Diagnosis not present

## 2020-02-03 ENCOUNTER — Encounter: Payer: Medicare Other | Admitting: Family Medicine

## 2020-02-16 ENCOUNTER — Ambulatory Visit: Payer: Medicare Other | Admitting: Family Medicine

## 2020-02-19 DIAGNOSIS — E1169 Type 2 diabetes mellitus with other specified complication: Secondary | ICD-10-CM | POA: Diagnosis not present

## 2020-02-19 DIAGNOSIS — E785 Hyperlipidemia, unspecified: Secondary | ICD-10-CM | POA: Diagnosis not present

## 2020-02-20 LAB — LIPID PANEL
Cholesterol: 346 mg/dL — ABNORMAL HIGH (ref <200)
HDL: 49 mg/dL — ABNORMAL LOW (ref >=50)
LDL Cholesterol (Calc): 258 mg/dL (calc) — ABNORMAL HIGH
Non-HDL Cholesterol (Calc): 297 mg/dL (calc) — ABNORMAL HIGH (ref <130)
Total CHOL/HDL Ratio: 7.1 (calc) — ABNORMAL HIGH (ref <5.0)
Triglycerides: 205 mg/dL — ABNORMAL HIGH (ref <150)

## 2020-02-20 LAB — MICROALBUMIN, URINE: Microalb, Ur: 0.8 mg/dL

## 2020-02-20 LAB — COMPLETE METABOLIC PANEL WITH GFR
AG Ratio: 1.8 (calc) (ref 1.0–2.5)
ALT: 15 U/L (ref 6–29)
AST: 14 U/L (ref 10–35)
Albumin: 4.5 g/dL (ref 3.6–5.1)
Alkaline phosphatase (APISO): 62 U/L (ref 37–153)
BUN: 20 mg/dL (ref 7–25)
CO2: 28 mmol/L (ref 20–32)
Calcium: 9.9 mg/dL (ref 8.6–10.4)
Chloride: 103 mmol/L (ref 98–110)
Creat: 0.76 mg/dL (ref 0.60–0.93)
GFR, Est African American: 88 mL/min/{1.73_m2} (ref >=60)
GFR, Est Non African American: 76 mL/min/{1.73_m2} (ref >=60)
Globulin: 2.5 g/dL (calc) (ref 1.9–3.7)
Glucose, Bld: 169 mg/dL — ABNORMAL HIGH (ref 65–99)
Potassium: 4.4 mmol/L (ref 3.5–5.3)
Sodium: 139 mmol/L (ref 135–146)
Total Bilirubin: 0.7 mg/dL (ref 0.2–1.2)
Total Protein: 7 g/dL (ref 6.1–8.1)

## 2020-02-20 LAB — HEMOGLOBIN A1C
Hgb A1c MFr Bld: 7 % of total Hgb — ABNORMAL HIGH (ref <5.7)
Mean Plasma Glucose: 154 (calc)
eAG (mmol/L): 8.5 (calc)

## 2020-02-22 ENCOUNTER — Telehealth (INDEPENDENT_AMBULATORY_CARE_PROVIDER_SITE_OTHER): Payer: Medicare Other

## 2020-02-22 ENCOUNTER — Other Ambulatory Visit: Payer: Self-pay

## 2020-02-22 VITALS — BP 126/72 | Ht 66.0 in | Wt 186.0 lb

## 2020-02-22 DIAGNOSIS — Z1159 Encounter for screening for other viral diseases: Secondary | ICD-10-CM | POA: Diagnosis not present

## 2020-02-22 DIAGNOSIS — Z0000 Encounter for general adult medical examination without abnormal findings: Secondary | ICD-10-CM | POA: Diagnosis not present

## 2020-02-22 NOTE — Progress Notes (Addendum)
Subjective:   Taylor Mitchell is a 79 y.o. female who presents for Medicare Annual (Subsequent) preventive examination.  Review of Systems    Cardiac Risk Factors include: advanced age (>38men, >71 women);diabetes mellitus;dyslipidemia;sedentary lifestyle;obesity (BMI >30kg/m2);smoking/ tobacco exposure     Objective:    Today's Vitals   02/22/20 1538  BP: 126/72  Weight: 186 lb (84.4 kg)  Height: 5\' 6"  (1.676 m)   Body mass index is 30.02 kg/m.  Advanced Directives 02/22/2020 01/28/2018 06/25/2017 06/04/2017 06/03/2017 11/15/2016 02/21/2016  Does Patient Have a Medical Advance Directive? No No No No No No No  Would patient like information on creating a medical advance directive? Yes (ED - Information included in AVS) No - Patient declined No - Patient declined - - No - Patient declined No - patient declined information    Current Medications (verified) Outpatient Encounter Medications as of 02/22/2020  Medication Sig  . ALPRAZolam (XANAX) 0.25 MG tablet Take one half to one tablet three times weekly, as needed, for anxiety  . aspirin (BAYER ASPIRIN EC LOW DOSE) 81 MG EC tablet Take 81 mg by mouth every other day.   . fluticasone (FLONASE) 50 MCG/ACT nasal spray Place 2 sprays into both nostrils daily as needed for allergies.   Marland Kitchen loratadine (CLARITIN) 10 MG tablet Take 10 mg by mouth daily as needed for allergies.   . Misc Natural Products (WHITE WILLOW BARK PO) Take 400 mg by mouth daily as needed (for pain).   . mometasone (ELOCON) 0.1 % lotion Place 1 drop in affected ear (s0 twice daily as needed, for excessive itch  . Multiple Vitamins-Minerals (CENTRUM SILVER 50+WOMEN) TABS Take 1 tablet by mouth daily. When she remembers  . Omega-3 Fatty Acids (FISH OIL) 1200 MG CAPS Take 2,400 mg by mouth daily.   No facility-administered encounter medications on file as of 02/22/2020.    Allergies (verified) Statins, Metoprolol, Pseudoephedrine, Repatha [evolocumab], and Corticosteroids     History: Past Medical History:  Diagnosis Date  . Allergic rhinitis   . Anxiety   . Chronic back pain    Seeing chiropractor for almost 77 years, fell off a tractor at age 13  . Diabetes mellitus, type 2 (HCC)    Prediabetic, diet controlled  . Dyspepsia   . Hearing loss   . Hyperlipidemia    Intolerant of all medication  . Pain and swelling of knee, left 09/02/2018  . Peripheral neuropathy   . Stroke ([place]) 2015  . Vitamin D deficiency    Past Surgical History:  Procedure Laterality Date  . CATARACT EXTRACTION W/PHACO Left 06/04/2017   Procedure: CATARACT EXTRACTION PHACO AND INTRAOCULAR LENS PLACEMENT (IOC);  Surgeon: Rutherford Guys, MD;  Location: [facility];  Service: Ophthalmology;  Laterality: Left;  CDE: 3.54  . CATARACT EXTRACTION W/PHACO Right 06/25/2017   Procedure: CATARACT EXTRACTION PHACO AND INTRAOCULAR LENS PLACEMENT (IOC);  Surgeon: Rutherford Guys, MD;  Location: [facility];  Service: Ophthalmology;  Laterality: Right;  CDE: 7.86  . COLONOSCOPY N/A 02/21/2016   Procedure: COLONOSCOPY;  Surgeon: Aviva Signs, MD;  Location: [facility];  Service: Gastroenterology;  Laterality: N/A;  . INGUINAL HERNIA REPAIR Right 2004 approx  . NASAL SEPTUM SURGERY  1967  . OOPHORECTOMY Bilateral   . PARTIAL HYSTERECTOMY    . TONSILLECTOMY     Family History  Problem Relation Age of Onset  . Ovarian cancer Mother   . Kidney disease Brother   . Kidney failure Brother   . Cirrhosis Sister   .  Bladder Cancer Father   . Heart attack Sister   . Heart attack Sister   . Heart attack Brother   . Breast cancer Neg Hx    Social History   Socioeconomic History  . Marital status: Married    Spouse name: Not on file  . Number of children: Not on file  . Years of education: Not on file  . Highest education level: Not on file  Occupational History  . Not on file  Tobacco Use  . Smoking status: Former Smoker    Packs/day: 1.00    Years: 20.00    Pack years: 20.00    Types:  Cigarettes    Start date: 08/14/1955    Quit date: 08/14/1983    Years since quitting: 36.5  . Smokeless tobacco: Current User    Types: Snuff  Vaping Use  . Vaping Use: Never used  Substance and Sexual Activity  . Alcohol use: No    Alcohol/week: 0.0 standard drinks  . Drug use: No  . Sexual activity: Never  Other Topics Concern  . Not on file  Social History Narrative  . Not on file   Social Determinants of Health   Financial Resource Strain: Low Risk   . Difficulty of Paying Living Expenses: Not hard at all  Food Insecurity: No Food Insecurity  . Worried About Charity fundraiser in the Last Year: Never true  . Ran Out of Food in the Last Year: Never true  Transportation Needs: No Transportation Needs  . Lack of Transportation (Medical): No  . Lack of Transportation (Non-Medical): No  Physical Activity: Insufficiently Active  . Days of Exercise per Week: 3 days  . Minutes of Exercise per Session: 10 min  Stress: No Stress Concern Present  . Feeling of Stress : Not at all  Social Connections: Moderately Integrated  . Frequency of Communication with Friends and Family: More than three times a week  . Frequency of Social Gatherings with Friends and Family: More than three times a week  . Attends Religious Services: More than 4 times per year  . Active Member of Clubs or Organizations: No  . Attends Archivist Meetings: Never  . Marital Status: Married    Tobacco Counseling Ready to quit: Not Answered Counseling given: Not Answered   Clinical Intake:  Pre-visit preparation completed: No  Pain : No/denies pain     Nutritional Status: BMI > 30  Obese Diabetes: Yes CBG done?: No Did pt. bring in CBG monitor from home?: No  How often do you need to have someone help you when you read instructions, pamphlets, or other written materials from your doctor or pharmacy?: 1 - Never What is the last grade level you completed in school?: GED  Diabetic?  yes  Interpreter Needed?: No  Information entered by :: Wilkes Potvin LPN   Activities of Daily Living In your present state of health, do you have any difficulty performing the following activities: 02/22/2020  Hearing? Y  Comment has a hearing aid  Vision? N  Difficulty concentrating or making decisions? N  Walking or climbing stairs? N  Dressing or bathing? N  Doing errands, shopping? N  Preparing Food and eating ? N  Using the Toilet? N  In the past six months, have you accidently leaked urine? N  Do you have problems with loss of bowel control? N  Managing your Medications? N  Managing your Finances? N  Housekeeping or managing your Housekeeping? N  Some  recent data might be hidden    Patient Care Team: Fayrene Helper, MD as PCP - General Domenic Polite Aloha Gell, MD as PCP - Cardiology (Cardiology) Allyn Kenner, MD as Consulting Physician (Dermatology)  Indicate any recent Medical Services you may have received from other than [facility] providers in the past year (date may be approximate).     Assessment:   This is a routine wellness examination for Codie.  Hearing/Vision screen No exam data present  Dietary issues and exercise activities discussed: Current Exercise Habits: The patient has a physically strenuous job, but has no regular exercise apart from work. 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 Exercise 3x per week (30 min per time)     Recommend starting a routine exercise program at least 3 days a week for 30-45 minutes at a time as tolerated.      . Prevent falls      Depression Screen PHQ 2/9 Scores 02/22/2020 01/30/2019 09/02/2018 04/01/2018 03/26/2018 01/30/2018 01/28/2018  PHQ - 2 Score 0 0 0 0 0 0 0  PHQ- 9 Score - - - 2 - - -    Fall Risk Fall Risk  04/08/2019 01/30/2019 09/02/2018 04/01/2018 03/26/2018  Falls in the past year? 0 0 0 No No  Number falls in  past yr: 0 - 0 - -  Comment - - - - -  Injury with Fall? 0 0 0 - -  Comment - - - - -  Risk for fall due to : - - - - -  Follow up - - - - -  Comment - - - - -    Any stairs in or around the home? No  If so, are there any without handrails? No  Home free of loose throw rugs in walkways, pet beds, electrical cords, etc? Yes  Adequate lighting in your home to reduce risk of falls? Yes   ASSISTIVE DEVICES UTILIZED TO PREVENT FALLS:  Life alert? Yes  Use of a cane, walker or w/c? No  Grab bars in the bathroom? Yes  Shower chair or bench in shower? Yes  Elevated toilet seat or a handicapped toilet? No   TIMED UP AND GO:  Was the test performed? No .  Length of time to ambulate 10 feet:  not performed due to virtual visit     Cognitive Function:     6CIT Screen 02/22/2020 01/30/2019 01/28/2018 11/15/2016  What Year? 0 points 0 points 0 points 0 points  What month? 0 points 0 points 0 points 0 points  What time? 0 points 0 points 0 points 0 points  Count back from 20 0 points 0 points 0 points 0 points  Months in reverse 2 points 0 points 0 points 0 points  Repeat phrase 0 points 0 points 0 points 0 points  Total Score 2 0 0 0    Immunizations Immunization History  Administered Date(s) Administered  . Fluad Quad(high Dose 65+) 05/18/2019  . Influenza Split 04/17/2012, 05/14/2013  . Influenza,inj,Quad PF,6+ Mos 05/18/2014, 05/02/2015, 06/19/2016, 05/27/2017, 05/15/2018  . Moderna SARS-COVID-2 Vaccination 08/28/2019, 09/28/2019  . Pneumococcal Conjugate-13 03/02/2014  . Pneumococcal Polysaccharide-23 09/19/2015  . Td 10/28/2008    TDAP status: Up to date not covered by medicare as preventative Flu Vaccine status: Up to date Pneumococcal vaccine status: Up to date Covid-19 vaccine status: Completed vaccines  Qualifies for Shingles Vaccine?  Yes   Zostavax completed No   Shingrix Completed?: No.    Education has been provided regarding the importance of this vaccine.  Patient has been advised to call insurance company to determine out of pocket expense if they have not yet received this vaccine. Advised may also receive vaccine at local pharmacy or Health Dept. Verbalized acceptance and understanding.  Screening Tests Health Maintenance  Topic Date Due  . Hepatitis C Screening  Never done  . TETANUS/TDAP  09/12/2020 (Originally 10/29/2018)  . INFLUENZA VACCINE  03/13/2020  . HEMOGLOBIN A1C  08/21/2020  . COLONOSCOPY  02/20/2021  . DEXA SCAN  Completed  . COVID-19 Vaccine  Completed  . PNA vac Low Risk Adult  Completed    Health Maintenance  Health Maintenance Due  Topic Date Due  . Hepatitis C Screening  Never done    Colorectal cancer screening: No longer required.  Mammogram status: Completed yes. Repeat every year Bone density- declined  Lung Cancer Screening: (Low Dose CT Chest recommended if Age 55-80 years, 30 pack-year currently smoking OR have quit w/in 15years.) does not qualify.   Lung Cancer Screening Referral: no  Additional Screening:  Hepatitis C Screening: does qualify; ordered  Vision Screening: Recommended annual ophthalmology exams for early detection of glaucoma and other disorders of the eye. Is the patient up to date with their annual eye exam?  Yes  Who is the provider or what is the name of the office in which the patient attends annual eye exams? [facility] If pt is not established with a provider, would they like to be referred to a provider to establish care? No .   Dental Screening: Recommended annual dental exams for proper oral hygiene  Community Resource Referral / Chronic Care Management: CRR required this visit?  No   CCM required this visit?  No      Plan:     I have personally reviewed and noted the following in the patient's chart:   . Medical and social history . Use of alcohol, tobacco or illicit drugs  . Current medications and supplements . Functional ability and status . Nutritional  status . Physical activity . Advanced directives . List of other physicians . Hospitalizations, surgeries, and ER visits in previous 12 months . Vitals . Screenings to include cognitive, depression, and falls . Referrals and appointments  In addition, I have reviewed and discussed with patient certain preventive protocols, quality metrics, and best practice recommendations. A written personalized care plan for preventive services as well as general preventive health recommendations were provided to patient.     Kate Sable, LPN, LPN   05/03/1938   Nurse Notes: Telephone visit. Patient was at her home. Provider in office. Time spent with patient- 30 mins

## 2020-02-22 NOTE — Patient Instructions (Signed)
Taylor Mitchell , Thank you for taking time to come for your Medicare Wellness Visit. I appreciate your ongoing commitment to your health goals. Please review the following plan we discussed and let me know if I can assist you in the future.   Screening recommendations/referrals: Colonoscopy: no longer recommended Mammogram: up to date Bone Density: declined Recommended yearly ophthalmology/optometry visit for glaucoma screening and checkup Recommended yearly dental visit for hygiene and checkup  Vaccinations: Influenza vaccine: up to date  Pneumococcal vaccine: up to date  Tdap vaccine: up to date  Shingles vaccine: Can get shingles vaccine (Shingrix) at pharmacy         Next appointment: wellness in 1 year   Preventive Care 43 Years and Older, Female Preventive care refers to lifestyle choices and visits with your health care provider that can promote health and wellness. What does preventive care include?  A yearly physical exam. This is also called an annual well check.  Dental exams once or twice a year.  Routine eye exams. Ask your health care provider how often you should have your eyes checked.  Personal lifestyle choices, including:  Daily care of your teeth and gums.  Regular physical activity.  Eating a healthy diet.  Avoiding tobacco and drug use.  Limiting alcohol use.  Practicing safe sex.  Taking low-dose aspirin every day.  Taking vitamin and mineral supplements as recommended by your health care provider. What happens during an annual well check? The services and screenings done by your health care provider during your annual well check will depend on your age, overall health, lifestyle risk factors, and family history of disease. Counseling  Your health care provider may ask you questions about your:  Alcohol use.  Tobacco use.  Drug use.  Emotional well-being.  Home and relationship well-being.  Sexual activity.  Eating  habits.  History of falls.  Memory and ability to understand (cognition).  Work and work Statistician.  Reproductive health. Screening  You may have the following tests or measurements:  Height, weight, and BMI.  Blood pressure.  Lipid and cholesterol levels. These may be checked every 5 years, or more frequently if you are over 57 years old.  Skin check.  Lung cancer screening. You may have this screening every year starting at age 67 if you have a 30-pack-year history of smoking and currently smoke or have quit within the past 15 years.  Fecal occult blood test (FOBT) of the stool. You may have this test every year starting at age 62.  Flexible sigmoidoscopy or colonoscopy. You may have a sigmoidoscopy every 5 years or a colonoscopy every 10 years starting at age 34.  Hepatitis C blood test.  Hepatitis B blood test.  Sexually transmitted disease (STD) testing.  Diabetes screening. This is done by checking your blood sugar (glucose) after you have not eaten for a while (fasting). You may have this done every 1-3 years.  Bone density scan. This is done to screen for osteoporosis. You may have this done starting at age 88.  Mammogram. This may be done every 1-2 years. Talk to your health care provider about how often you should have regular mammograms. Talk with your health care provider about your test results, treatment options, and if necessary, the need for more tests. Vaccines  Your health care provider may recommend certain vaccines, such as:  Influenza vaccine. This is recommended every year.  Tetanus, diphtheria, and acellular pertussis (Tdap, Td) vaccine. You may need a Td booster every  10 years.  Zoster vaccine. You may need this after age 11.  Pneumococcal 13-valent conjugate (PCV13) vaccine. One dose is recommended after age 106.  Pneumococcal polysaccharide (PPSV23) vaccine. One dose is recommended after age 69. Talk to your health care provider about which  screenings and vaccines you need and how often you need them. This information is not intended to replace advice given to you by your health care provider. Make sure you discuss any questions you have with your health care provider. Document Released: 08/26/2015 Document Revised: 04/18/2016 Document Reviewed: 05/31/2015 Elsevier Interactive Patient Education  2017 [place] Prevention in the Home Falls can cause injuries. They can happen to people of all ages. There are many things you can do to make your home safe and to help prevent falls. What can I do on the outside of my home?  Regularly fix the edges of walkways and driveways and fix any cracks.  Remove anything that might make you trip as you walk through a door, such as a raised step or threshold.  Trim any bushes or trees on the path to your home.  Use bright outdoor lighting.  Clear any walking paths of anything that might make someone trip, such as rocks or tools.  Regularly check to see if handrails are loose or broken. Make sure that both sides of any steps have handrails.  Any raised decks and porches should have guardrails on the edges.  Have any leaves, snow, or ice cleared regularly.  Use sand or salt on walking paths during winter.  Clean up any spills in your garage right away. This includes oil or grease spills. What can I do in the bathroom?  Use night lights.  Install grab bars by the toilet and in the tub and shower. Do not use towel bars as grab bars.  Use non-skid mats or decals in the tub or shower.  If you need to sit down in the shower, use a plastic, non-slip stool.  Keep the floor dry. Clean up any water that spills on the floor as soon as it happens.  Remove soap buildup in the tub or shower regularly.  Attach bath mats securely with double-sided non-slip rug tape.  Do not have throw rugs and other things on the floor that can make you trip. What can I do in the bedroom?  Use  night lights.  Make sure that you have a light by your bed that is easy to reach.  Do not use any sheets or blankets that are too big for your bed. They should not hang down onto the floor.  Have a firm chair that has side arms. You can use this for support while you get dressed.  Do not have throw rugs and other things on the floor that can make you trip. What can I do in the kitchen?  Clean up any spills right away.  Avoid walking on wet floors.  Keep items that you use a lot in easy-to-reach places.  If you need to reach something above you, use a strong step stool that has a grab bar.  Keep electrical cords out of the way.  Do not use floor polish or wax that makes floors slippery. If you must use wax, use non-skid floor wax.  Do not have throw rugs and other things on the floor that can make you trip. What can I do with my stairs?  Do not leave any items on the stairs.  Make sure  that there are handrails on both sides of the stairs and use them. Fix handrails that are broken or loose. Make sure that handrails are as long as the stairways.  Check any carpeting to make sure that it is firmly attached to the stairs. Fix any carpet that is loose or worn.  Avoid having throw rugs at the top or bottom of the stairs. If you do have throw rugs, attach them to the floor with carpet tape.  Make sure that you have a light switch at the top of the stairs and the bottom of the stairs. If you do not have them, ask someone to add them for you. What else can I do to help prevent falls?  Wear shoes that:  Do not have high heels.  Have rubber bottoms.  Are comfortable and fit you well.  Are closed at the toe. Do not wear sandals.  If you use a stepladder:  Make sure that it is fully opened. Do not climb a closed stepladder.  Make sure that both sides of the stepladder are locked into place.  Ask someone to hold it for you, if possible.  Clearly mark and make sure that you  can see:  Any grab bars or handrails.  First and last steps.  Where the edge of each step is.  Use tools that help you move around (mobility aids) if they are needed. These include:  Canes.  Walkers.  Scooters.  Crutches.  Turn on the lights when you go into a dark area. Replace any light bulbs as soon as they burn out.  Set up your furniture so you have a clear path. Avoid moving your furniture around.  If any of your floors are uneven, fix them.  If there are any pets around you, be aware of where they are.  Review your medicines with your doctor. Some medicines can make you feel dizzy. This can increase your chance of falling. Ask your doctor what other things that you can do to help prevent falls. This information is not intended to replace advice given to you by your health care provider. Make sure you discuss any questions you have with your health care provider. Document Released: 05/26/2009 Document Revised: 01/05/2016 Document Reviewed: 09/03/2014 Elsevier Interactive Patient Education  2017 [employer].

## 2020-02-23 ENCOUNTER — Encounter: Payer: Self-pay | Admitting: Family Medicine

## 2020-02-23 ENCOUNTER — Other Ambulatory Visit: Payer: Self-pay

## 2020-02-23 ENCOUNTER — Ambulatory Visit (INDEPENDENT_AMBULATORY_CARE_PROVIDER_SITE_OTHER): Payer: Medicare Other | Admitting: Family Medicine

## 2020-02-23 VITALS — BP 117/73 | HR 60 | Resp 16 | Ht 66.0 in | Wt 185.0 lb

## 2020-02-23 DIAGNOSIS — E663 Overweight: Secondary | ICD-10-CM | POA: Diagnosis not present

## 2020-02-23 DIAGNOSIS — E8881 Metabolic syndrome: Secondary | ICD-10-CM

## 2020-02-23 DIAGNOSIS — E1169 Type 2 diabetes mellitus with other specified complication: Secondary | ICD-10-CM | POA: Diagnosis not present

## 2020-02-23 DIAGNOSIS — Z1159 Encounter for screening for other viral diseases: Secondary | ICD-10-CM | POA: Diagnosis not present

## 2020-02-23 DIAGNOSIS — F411 Generalized anxiety disorder: Secondary | ICD-10-CM

## 2020-02-23 DIAGNOSIS — E785 Hyperlipidemia, unspecified: Secondary | ICD-10-CM

## 2020-02-23 NOTE — Patient Instructions (Addendum)
F/U in office with MD in 3.5 months, call if you need me before  Need to reduce fried and fatty foods, egg yolk, butter, oils, cholesterol is very high, increasing heart disease risk  Blood sugar has increase so no sugar except in fruit, and reduce potato, pasta and bread  Fasting lipid, cmp and EGFR, hBA1C and  Hepatitis C screen in 3 months and 1 week  If you change your mind about the dietitian let us know  It is important that you exercise regularly at least 30 minutes 5 times a week. If you develop chest pain, have severe difficulty breathing, or feel very tired, stop exercising immediately and seek medical attention    Think about what you will eat, plan ahead. Choose " clean, green, fresh or frozen" over canned, processed or packaged foods which are more sugary, salty and fatty. 70 to 75% of food eaten should be vegetables and fruit. Three meals at set times with snacks allowed between meals, but they must be fruit or vegetables. Aim to eat over a 12 hour period , example 7 am to 7 pm, and STOP after  your last meal of the day. Drink water,generally about 64 ounces per day, no other drink is as healthy. Fruit juice is best enjoyed in a healthy way, by EATING the fruit. Thanks for choosing [facility], we consider it a privelige to serve you.

## 2020-02-23 NOTE — Assessment & Plan Note (Addendum)
Deteriorated, n dietian referral recommended, refuses Taylor Mitchell is reminded of the importance of commitment to daily physical activity for 30 minutes or more, as able and the need to limit carbohydrate intake to 30 to 60 grams per meal to help with blood sugar control.     Taylor Mitchell is reminded of the importance of daily foot exam, annual eye examination, and good blood sugar, blood pressure and cholesterol control.  Diabetic Labs Latest Ref Rng & Units 02/19/2020 09/02/2019 04/03/2019 09/02/2018 05/15/2018  HbA1c <5.7 % of total Hgb 7.0(H) 6.9(H) 7.0(H) 6.4(H) -  Microalbumin mg/dL 0.8 - - - -  Chol <200 mg/dL 346(H) 230(H) 248(H) - 211(H)  HDL > OR = 50 mg/dL 49(L) 52 57 - 45  Calc LDL mg/dL (calc) 258(H) 158(H) 160(H) - 145(H)  Triglycerides <150 mg/dL 205(H) 91 161(H) - 106  Creatinine 0.60 - 0.93 mg/dL 0.76 0.79 0.68 0.66 -   BP/Weight 02/23/2020 02/22/2020 12/28/2019 09/16/2019 09/01/2019 06/26/2019 9/97/7414  Systolic BP 239 532 023 343 568 616 837  Diastolic BP 73 72 72 66 70 66 63  Wt. (Lbs) 185 186 186 182.2 178 178 180  BMI 29.86 30.02 30.02 29.41 28.73 28.73 29.05   Foot/eye exam completion dates Latest Ref Rng & Units 03/03/2019 12/10/2017  Eye Exam No Retinopathy No Retinopathy -  Foot Form Completion - - Done

## 2020-02-27 ENCOUNTER — Encounter: Payer: Self-pay | Admitting: Family Medicine

## 2020-02-27 NOTE — Assessment & Plan Note (Signed)
The increased risk of cardiovascular disease associated with this diagnosis, and the need to consistently work on lifestyle to change this is discussed. Following  a  heart healthy diet ,commitment to 30 minutes of exercise at least 5 days per week, as well as control of blood sugar and cholesterol , and achieving a healthy weight are all the areas to be addressed .  

## 2020-02-27 NOTE — Assessment & Plan Note (Signed)
Hyperlipidemia:Low fat diet discussed and encouraged.   Lipid Panel  Lab Results  Component Value Date   CHOL 346 (H) 02/19/2020   HDL 49 (L) 02/19/2020   LDLCALC 258 (H) 02/19/2020   TRIG 205 (H) 02/19/2020   CHOLHDL 7.1 (H) 02/19/2020   Statin tolerant

## 2020-02-27 NOTE — Progress Notes (Signed)
Taylor Mitchell     MRN: [id number]      DOB: 1941/03/13   HPI Taylor Mitchell is here for follow up and re-evaluation of chronic medical conditions, medication management and review of any available recent lab and radiology data.  Preventive health is updated, specifically  Cancer screening and Immunization.   Questions or concerns regarding consultations or procedures which the PT has had in the interim are  addressed. The PT denies any adverse reactions to current medications since the last visit.  Concerned re worsening of cholesterol and diabetes. No interest in dietitian consult, states she knows what to do and will change hr food choices and start walking regularly Denies polyuria, polydipsia, or  blurred vision   ROS Denies recent fever or chills. Denies sinus pressure, nasal congestion, ear pain or sore throat. Denies chest congestion, productive cough or wheezing. Denies chest pains, palpitations and leg swelling Denies abdominal pain, nausea, vomiting,diarrhea or constipation.   Denies dysuria, frequency, hesitancy or incontinence. Denies joint pain, swelling and limitation in mobility. Denies headaches, seizures, numbness, or tingling. Denies depression, c/o increased anxiety orno nsomnia. Denies skin break down or rash.   PE  BP 117/73   Pulse 60   Resp 16   Ht 5\' 6"  (1.676 m)   Wt 185 lb (83.9 kg)   SpO2 95%   BMI 29.86 kg/m   Patient alert and oriented and in no cardiopulmonary distress.  HEENT: No facial asymmetry, EOMI,     Neck supple .  Chest: Clear to auscultation bilaterally.  CVS: S1, S2 no murmurs, no S3.Regular rate.  ABD: Soft non tender.   Ext: No edema  MS: Adequate ROM spine, shoulders, hips and knees.  Skin: Intact, no ulcerations or rash noted.  Psych: Good eye contact, normal affect. Memory intact not anxious or depressed appearing.  CNS: CN 2-12 intact, power,  normal throughout.no focal deficits noted.   Assessment & Plan  Type 2  diabetes mellitus with other specified complication (HCC) Deteriorated, n dietian referral recommended, refuses Taylor Mitchell is reminded of the importance of commitment to daily physical activity for 30 minutes or more, as able and the need to limit carbohydrate intake to 30 to 60 grams per meal to help with blood sugar control.     Taylor Mitchell is reminded of the importance of daily foot exam, annual eye examination, and good blood sugar, blood pressure and cholesterol control.  Diabetic Labs Latest Ref Rng & Units 02/19/2020 09/02/2019 04/03/2019 09/02/2018 05/15/2018  HbA1c <5.7 % of total Hgb 7.0(H) 6.9(H) 7.0(H) 6.4(H) -  Microalbumin mg/dL 0.8 - - - -  Chol <200 mg/dL 346(H) 230(H) 248(H) - 211(H)  HDL > OR = 50 mg/dL 49(L) 52 57 - 45  Calc LDL mg/dL (calc) 258(H) 158(H) 160(H) - 145(H)  Triglycerides <150 mg/dL 205(H) 91 161(H) - 106  Creatinine 0.60 - 0.93 mg/dL 0.76 0.79 0.68 0.66 -   BP/Weight 02/23/2020 02/22/2020 12/28/2019 09/16/2019 09/01/2019 06/26/2019 12/21/2583  Systolic BP 277 824 235 361 443 154 008  Diastolic BP 73 72 72 66 70 66 63  Wt. (Lbs) 185 186 186 182.2 178 178 180  BMI 29.86 30.02 30.02 29.41 28.73 28.73 29.05   Foot/eye exam completion dates Latest Ref Rng & Units 03/03/2019 12/10/2017  Eye Exam No Retinopathy No Retinopathy -  Foot Form Completion - - Done        Hyperlipidemia associated with type 2 diabetes mellitus ([place]) Hyperlipidemia:Low fat diet discussed and encouraged.  Lipid Panel  Lab Results  Component Value Date   CHOL 346 (H) 02/19/2020   HDL 49 (L) 02/19/2020   LDLCALC 258 (H) 02/19/2020   TRIG 205 (H) 02/19/2020   CHOLHDL 7.1 (H) 02/19/2020   Statin tolerant    Overweight (BMI 25.0-29.9)  Patient re-educated about  the importance of commitment to a  minimum of 150 minutes of exercise per week as able.  The importance of healthy food choices with portion control discussed, as well as eating regularly and within a 12 hour window most  days. The need to choose "clean , green" food 50 to 75% of the time is discussed, as well as to make water the primary drink and set a goal of 64 ounces water daily.    Weight /BMI 02/23/2020 02/22/2020 12/28/2019  WEIGHT 185 lb 186 lb 186 lb  HEIGHT 5\' 6"  5\' 6"  5\' 6"   BMI 29.86 kg/m2 30.02 kg/m2 30.02 kg/m2      Metabolic syndrome X The increased risk of cardiovascular disease associated with this diagnosis, and the need to consistently work on lifestyle to change this is discussed. Following  a  heart healthy diet ,commitment to 30 minutes of exercise at least 5 days per week, as well as control of blood sugar and cholesterol , and achieving a healthy weight are all the areas to be addressed .   GAD (generalized anxiety disorder) Unchanged, no change in management. Stress reduction and relaxation techniques discussed

## 2020-02-27 NOTE — Assessment & Plan Note (Signed)
Unchanged, no change in management. Stress reduction and relaxation techniques discussed

## 2020-02-27 NOTE — Assessment & Plan Note (Signed)
  Patient re-educated about  the importance of commitment to a  minimum of 150 minutes of exercise per week as able.  The importance of healthy food choices with portion control discussed, as well as eating regularly and within a 12 hour window most days. The need to choose "clean , green" food 50 to 75% of the time is discussed, as well as to make water the primary drink and set a goal of 64 ounces water daily.    Weight /BMI 02/23/2020 02/22/2020 12/28/2019  WEIGHT 185 lb 186 lb 186 lb  HEIGHT 5\' 6"  5\' 6"  5\' 6"   BMI 29.86 kg/m2 30.02 kg/m2 30.02 kg/m2

## 2020-02-29 DIAGNOSIS — M9903 Segmental and somatic dysfunction of lumbar region: Secondary | ICD-10-CM | POA: Diagnosis not present

## 2020-02-29 DIAGNOSIS — M9902 Segmental and somatic dysfunction of thoracic region: Secondary | ICD-10-CM | POA: Diagnosis not present

## 2020-02-29 DIAGNOSIS — M546 Pain in thoracic spine: Secondary | ICD-10-CM | POA: Diagnosis not present

## 2020-02-29 DIAGNOSIS — M545 Low back pain: Secondary | ICD-10-CM | POA: Diagnosis not present

## 2020-02-29 DIAGNOSIS — M9905 Segmental and somatic dysfunction of pelvic region: Secondary | ICD-10-CM | POA: Diagnosis not present

## 2020-03-03 DIAGNOSIS — E119 Type 2 diabetes mellitus without complications: Secondary | ICD-10-CM | POA: Diagnosis not present

## 2020-03-03 DIAGNOSIS — Z961 Presence of intraocular lens: Secondary | ICD-10-CM | POA: Diagnosis not present

## 2020-03-03 LAB — HM DIABETES EYE EXAM

## 2020-03-30 DIAGNOSIS — M546 Pain in thoracic spine: Secondary | ICD-10-CM | POA: Diagnosis not present

## 2020-03-30 DIAGNOSIS — M9902 Segmental and somatic dysfunction of thoracic region: Secondary | ICD-10-CM | POA: Diagnosis not present

## 2020-03-30 DIAGNOSIS — M545 Low back pain: Secondary | ICD-10-CM | POA: Diagnosis not present

## 2020-03-30 DIAGNOSIS — M9903 Segmental and somatic dysfunction of lumbar region: Secondary | ICD-10-CM | POA: Diagnosis not present

## 2020-03-30 DIAGNOSIS — M9905 Segmental and somatic dysfunction of pelvic region: Secondary | ICD-10-CM | POA: Diagnosis not present

## 2020-06-02 DIAGNOSIS — E1169 Type 2 diabetes mellitus with other specified complication: Secondary | ICD-10-CM | POA: Diagnosis not present

## 2020-06-02 DIAGNOSIS — E785 Hyperlipidemia, unspecified: Secondary | ICD-10-CM | POA: Diagnosis not present

## 2020-06-02 DIAGNOSIS — Z1159 Encounter for screening for other viral diseases: Secondary | ICD-10-CM | POA: Diagnosis not present

## 2020-06-02 LAB — COMPLETE METABOLIC PANEL WITH GFR
Chloride: 105 mmol/L (ref 98–110)
Globulin: 2.4 g/dL (calc) (ref 1.9–3.7)

## 2020-06-03 LAB — COMPLETE METABOLIC PANEL WITH GFR
BUN: 15 mg/dL (ref 7–25)
GFR, Est Non African American: 84 mL/min/{1.73_m2} (ref >=60)
Glucose, Bld: 125 mg/dL — ABNORMAL HIGH (ref 65–99)
Potassium: 4.5 mmol/L (ref 3.5–5.3)
Total Bilirubin: 0.7 mg/dL (ref 0.2–1.2)

## 2020-06-06 LAB — HEPATITIS C ANTIBODY
Hepatitis C Ab: NONREACTIVE
SIGNAL TO CUT-OFF: 0 (ref <1.00)

## 2020-06-06 LAB — COMPLETE METABOLIC PANEL WITH GFR
AG Ratio: 1.8 (calc) (ref 1.0–2.5)
ALT: 11 U/L (ref 6–29)
AST: 11 U/L (ref 10–35)
Albumin: 4.3 g/dL (ref 3.6–5.1)
Alkaline phosphatase (APISO): 55 U/L (ref 37–153)
CO2: 28 mmol/L (ref 20–32)
Calcium: 9.9 mg/dL (ref 8.6–10.4)
Creat: 0.7 mg/dL (ref 0.60–0.93)
GFR, Est African American: 97 mL/min/{1.73_m2} (ref >=60)
Sodium: 140 mmol/L (ref 135–146)
Total Protein: 6.7 g/dL (ref 6.1–8.1)

## 2020-06-06 LAB — LIPID PANEL
Cholesterol: 314 mg/dL — ABNORMAL HIGH (ref <200)
HDL: 47 mg/dL — ABNORMAL LOW (ref >=50)
LDL Cholesterol (Calc): 237 mg/dL (calc) — ABNORMAL HIGH
Non-HDL Cholesterol (Calc): 267 mg/dL (calc) — ABNORMAL HIGH (ref <130)
Total CHOL/HDL Ratio: 6.7 (calc) — ABNORMAL HIGH (ref <5.0)
Triglycerides: 144 mg/dL (ref <150)

## 2020-06-06 LAB — HEMOGLOBIN A1C
Hgb A1c MFr Bld: 6.4 % of total Hgb — ABNORMAL HIGH (ref <5.7)
Mean Plasma Glucose: 137 (calc)
eAG (mmol/L): 7.6 (calc)

## 2020-06-07 ENCOUNTER — Encounter: Payer: Self-pay | Admitting: Family Medicine

## 2020-06-07 ENCOUNTER — Ambulatory Visit (INDEPENDENT_AMBULATORY_CARE_PROVIDER_SITE_OTHER): Payer: Medicare Other | Admitting: Family Medicine

## 2020-06-07 ENCOUNTER — Other Ambulatory Visit: Payer: Self-pay

## 2020-06-07 VITALS — BP 109/61 | HR 64 | Resp 16 | Ht 66.0 in | Wt 172.0 lb

## 2020-06-07 DIAGNOSIS — J3089 Other allergic rhinitis: Secondary | ICD-10-CM

## 2020-06-07 DIAGNOSIS — E785 Hyperlipidemia, unspecified: Secondary | ICD-10-CM

## 2020-06-07 DIAGNOSIS — E559 Vitamin D deficiency, unspecified: Secondary | ICD-10-CM

## 2020-06-07 DIAGNOSIS — E8881 Metabolic syndrome: Secondary | ICD-10-CM

## 2020-06-07 DIAGNOSIS — Z23 Encounter for immunization: Secondary | ICD-10-CM

## 2020-06-07 DIAGNOSIS — E1169 Type 2 diabetes mellitus with other specified complication: Secondary | ICD-10-CM

## 2020-06-07 DIAGNOSIS — E663 Overweight: Secondary | ICD-10-CM

## 2020-06-07 NOTE — Patient Instructions (Addendum)
Al physical exam with MD end Feb, call if you need me before  CONGRATS on marked improvement on both sugar and cholesterol , keep it up!  Flu vaccine today  Please start daily coated aspirin 81 mg  And vit D3 , 1000 IU  Please get Moderna booster next 1 to 2 weeks  Fasting lipid, cmp and eGFr, hBA1C, CBC , TSH and vit D 5 days before visit  It is important that you exercise regularly at least 30 minutes 5 times a week. If you develop chest pain, have severe difficulty breathing, or feel very tired, stop exercising immediately and seek medical attention  Think about what you will eat, plan ahead. Choose " clean, green, fresh or frozen" over canned, processed or packaged foods which are more sugary, salty and fatty. 70 to 75% of food eaten should be vegetables and fruit. Three meals at set times with snacks allowed between meals, but they must be fruit or vegetables. Aim to eat over a 12 hour period , example 7 am to 7 pm, and STOP after  your last meal of the day. Drink water,generally about 64 ounces per day, no other drink is as healthy. Fruit juice is best enjoyed in a healthy way, by EATING the fruit. Thanks for choosing [facility], we consider it a privelige to serve you.

## 2020-06-12 ENCOUNTER — Encounter: Payer: Self-pay | Admitting: Family Medicine

## 2020-06-12 NOTE — Assessment & Plan Note (Signed)
Improved with dietary change Hyperlipidemia:Low fat diet discussed and encouraged.   Lipid Panel  Lab Results  Component Value Date   CHOL 314 (H) 06/02/2020   HDL 47 (L) 06/02/2020   LDLCALC 237 (H) 06/02/2020   TRIG 144 06/02/2020   CHOLHDL 6.7 (H) 06/02/2020

## 2020-06-12 NOTE — Assessment & Plan Note (Signed)
  Patient re-educated about  the importance of commitment to a  minimum of 150 minutes of exercise per week as able.  The importance of healthy food choices with portion control discussed, as well as eating regularly and within a 12 hour window most days. The need to choose "clean , green" food 50 to 75% of the time is discussed, as well as to make water the primary drink and set a goal of 64 ounces water daily.    Weight /BMI 06/07/2020 02/23/2020 02/22/2020  WEIGHT 172 lb 0.6 oz 185 lb 186 lb  HEIGHT 5\' 6"  5\' 6"  5\' 6"   BMI 27.77 kg/m2 29.86 kg/m2 30.02 kg/m2  Marked improvement

## 2020-06-12 NOTE — Assessment & Plan Note (Addendum)
Taylor Mitchell is reminded of the importance of commitment to daily physical activity for 30 minutes or more, as able and the need to limit carbohydrate intake to 30 to 60 grams per meal to help with blood sugar control.   The need to take medication as prescribed, test blood sugar as directed, and to call between visits if there is a concern that blood sugar is uncontrolled is also discussed.   Taylor Mitchell is reminded of the importance of daily foot exam, annual eye examination, and good blood sugar, blood pressure and cholesterol control. Improved with dietary change, she is congratulat  Diabetic Labs Latest Ref Rng & Units 06/02/2020 02/19/2020 09/02/2019 04/03/2019 09/02/2018  HbA1c <5.7 % of total Hgb 6.4(H) 7.0(H) 6.9(H) 7.0(H) 6.4(H)  Microalbumin mg/dL - 0.8 - - -  Chol <200 mg/dL 314(H) 346(H) 230(H) 248(H) -  HDL > OR = 50 mg/dL 47(L) 49(L) 52 57 -  Calc LDL mg/dL (calc) 237(H) 258(H) 158(H) 160(H) -  Triglycerides <150 mg/dL 144 205(H) 91 161(H) -  Creatinine 0.60 - 0.93 mg/dL 0.70 0.76 0.79 0.68 0.66   BP/Weight 06/07/2020 02/23/2020 02/22/2020 12/28/2019 09/16/2019 09/01/2019 24/23/5361  Systolic BP 443 154 008 676 195 093 267  Diastolic BP 61 73 72 72 66 70 66  Wt. (Lbs) 172.04 185 186 186 182.2 178 178  BMI 27.77 29.86 30.02 30.02 29.41 28.73 28.73   Foot/eye exam completion dates Latest Ref Rng & Units 03/03/2020 02/23/2020  Eye Exam No Retinopathy No Retinopathy -  Foot Form Completion - - Done

## 2020-06-12 NOTE — Progress Notes (Signed)
Taylor Mitchell     MRN: [id number]      DOB: 11-11-1940   HPI Taylor Mitchell is here for follow up and re-evaluation of chronic medical conditions, medication management and review of any available recent lab and radiology data.  Preventive health is updated, specifically  Cancer screening and Immunization.   Questions or concerns regarding consultations or procedures which the PT has had in the interim are  addressed. The PT denies any adverse reactions to current medications since the last visit.  There are no new concerns.  There are no specific complaints   ROS Denies recent fever or chills. Denies sinus pressure, nasal congestion, ear pain or sore throat. Denies chest congestion, productive cough or wheezing. Denies chest pains, palpitations and leg swelling Denies abdominal pain, nausea, vomiting,diarrhea or constipation.   Denies dysuria, frequency, hesitancy or incontinence. Denies joint pain, swelling and limitation in mobility. Denies headaches, seizures, numbness, or tingling. Denies depression, anxiety or insomnia. Denies skin break down or rash.   PE  BP 109/61   Pulse 64   Resp 16   Ht 5\' 6"  (1.676 m)   Wt 172 lb 0.6 oz (78 kg)   SpO2 95%   BMI 27.77 kg/m   Patient alert and oriented and in no cardiopulmonary distress.  HEENT: No facial asymmetry, EOMI,     Neck supple .  Chest: Clear to auscultation bilaterally.  CVS: S1, S2 no murmurs, no S3.Regular rate.  ABD: Soft non tender.   Ext: No edema  MS: Adequate though reduced  ROM spine, shoulders, hips and knees.  Skin: Intact, no ulcerations or rash noted.  Psych: Good eye contact, normal affect. Memory intact not anxious or depressed appearing.  CNS: CN 2-12 intact, power,  normal throughout.no focal deficits noted.   Assessment & Plan  Type 2 diabetes mellitus with other specified complication ([place]) Taylor Mitchell is reminded of the importance of commitment to daily physical activity for 30 minutes  or more, as able and the need to limit carbohydrate intake to 30 to 60 grams per meal to help with blood sugar control.   The need to take medication as prescribed, test blood sugar as directed, and to call between visits if there is a concern that blood sugar is uncontrolled is also discussed.   Taylor Mitchell is reminded of the importance of daily foot exam, annual eye examination, and good blood sugar, blood pressure and cholesterol control. Improved with dietary change, she is congratulat  Diabetic Labs Latest Ref Rng & Units 06/02/2020 02/19/2020 09/02/2019 04/03/2019 09/02/2018  HbA1c <5.7 % of total Hgb 6.4(H) 7.0(H) 6.9(H) 7.0(H) 6.4(H)  Microalbumin mg/dL - 0.8 - - -  Chol <200 mg/dL 314(H) 346(H) 230(H) 248(H) -  HDL > OR = 50 mg/dL 47(L) 49(L) 52 57 -  Calc LDL mg/dL (calc) 237(H) 258(H) 158(H) 160(H) -  Triglycerides <150 mg/dL 144 205(H) 91 161(H) -  Creatinine 0.60 - 0.93 mg/dL 0.70 0.76 0.79 0.68 0.66   BP/Weight 06/07/2020 02/23/2020 02/22/2020 12/28/2019 09/16/2019 09/01/2019 43/32/9518  Systolic BP 841 660 630 160 109 323 557  Diastolic BP 61 73 72 72 66 70 66  Wt. (Lbs) 172.04 185 186 186 182.2 178 178  BMI 27.77 29.86 30.02 30.02 29.41 28.73 28.73   Foot/eye exam completion dates Latest Ref Rng & Units 03/03/2020 02/23/2020  Eye Exam No Retinopathy No Retinopathy -  Foot Form Completion - - Done        Hyperlipidemia associated with type 2  diabetes mellitus ([place]) Improved with dietary change Hyperlipidemia:Low fat diet discussed and encouraged.   Lipid Panel  Lab Results  Component Value Date   CHOL 314 (H) 06/02/2020   HDL 47 (L) 06/02/2020   LDLCALC 237 (H) 06/02/2020   TRIG 144 06/02/2020   CHOLHDL 6.7 (H) 06/02/2020       Overweight (BMI 25.0-29.9)  Patient re-educated about  the importance of commitment to a  minimum of 150 minutes of exercise per week as able.  The importance of healthy food choices with portion control discussed, as well as eating  regularly and within a 12 hour window most days. The need to choose "clean , green" food 50 to 75% of the time is discussed, as well as to make water the primary drink and set a goal of 64 ounces water daily.    Weight /BMI 06/07/2020 02/23/2020 02/22/2020  WEIGHT 172 lb 0.6 oz 185 lb 186 lb  HEIGHT 5\' 6"  5\' 6"  5\' 6"   BMI 27.77 kg/m2 29.86 kg/m2 30.02 kg/m2  Marked improvement    Allergic rhinitis Controlled, no change in medication

## 2020-06-12 NOTE — Assessment & Plan Note (Signed)
Controlled, no change in medication  

## 2020-07-14 ENCOUNTER — Encounter: Payer: Self-pay | Admitting: Pharmacist

## 2020-09-16 DIAGNOSIS — E785 Hyperlipidemia, unspecified: Secondary | ICD-10-CM | POA: Diagnosis not present

## 2020-09-16 DIAGNOSIS — E559 Vitamin D deficiency, unspecified: Secondary | ICD-10-CM | POA: Diagnosis not present

## 2020-09-16 DIAGNOSIS — E1169 Type 2 diabetes mellitus with other specified complication: Secondary | ICD-10-CM | POA: Diagnosis not present

## 2020-09-17 LAB — TSH: TSH: 3.82 u[IU]/mL (ref 0.450–4.500)

## 2020-09-17 LAB — CMP14+EGFR
ALT: 10 IU/L (ref 0–32)
AST: 14 IU/L (ref 0–40)
Albumin/Globulin Ratio: 1.8 (ref 1.2–2.2)
Albumin: 4.4 g/dL (ref 3.7–4.7)
Alkaline Phosphatase: 70 IU/L (ref 44–121)
BUN/Creatinine Ratio: 24 (ref 12–28)
BUN: 19 mg/dL (ref 8–27)
Bilirubin Total: 0.5 mg/dL (ref 0.0–1.2)
CO2: 22 mmol/L (ref 20–29)
Calcium: 9.5 mg/dL (ref 8.7–10.3)
Chloride: 103 mmol/L (ref 96–106)
Creatinine, Ser: 0.78 mg/dL (ref 0.57–1.00)
GFR calc Af Amer: 84 mL/min/{1.73_m2} (ref >59)
GFR calc non Af Amer: 73 mL/min/{1.73_m2} (ref >59)
Globulin, Total: 2.4 g/dL (ref 1.5–4.5)
Glucose: 142 mg/dL — ABNORMAL HIGH (ref 65–99)
Potassium: 4.1 mmol/L (ref 3.5–5.2)
Sodium: 140 mmol/L (ref 134–144)
Total Protein: 6.8 g/dL (ref 6.0–8.5)

## 2020-09-17 LAB — LIPID PANEL
Chol/HDL Ratio: 6.3 ratio — ABNORMAL HIGH (ref 0.0–4.4)
Cholesterol, Total: 333 mg/dL — ABNORMAL HIGH (ref 100–199)
HDL: 53 mg/dL (ref >39)
LDL Chol Calc (NIH): 249 mg/dL — ABNORMAL HIGH (ref 0–99)
Triglycerides: 163 mg/dL — ABNORMAL HIGH (ref 0–149)
VLDL Cholesterol Cal: 31 mg/dL (ref 5–40)

## 2020-09-17 LAB — CBC
Hematocrit: 41.9 % (ref 34.0–46.6)
Hemoglobin: 13.9 g/dL (ref 11.1–15.9)
MCH: 31.3 pg (ref 26.6–33.0)
MCHC: 33.2 g/dL (ref 31.5–35.7)
MCV: 94 fL (ref 79–97)
Platelets: 252 10*3/uL (ref 150–450)
RBC: 4.44 x10E6/uL (ref 3.77–5.28)
RDW: 12.1 % (ref 11.7–15.4)
WBC: 6.4 10*3/uL (ref 3.4–10.8)

## 2020-09-17 LAB — VITAMIN D 25 HYDROXY (VIT D DEFICIENCY, FRACTURES): Vit D, 25-Hydroxy: 21.4 ng/mL — ABNORMAL LOW (ref 30.0–100.0)

## 2020-09-17 LAB — HEMOGLOBIN A1C
Est. average glucose Bld gHb Est-mCnc: 140 mg/dL
Hgb A1c MFr Bld: 6.5 % — ABNORMAL HIGH (ref 4.8–5.6)

## 2020-09-20 ENCOUNTER — Encounter: Payer: Medicare Other | Admitting: Family Medicine

## 2020-09-27 ENCOUNTER — Telehealth: Payer: Self-pay

## 2020-09-27 NOTE — Telephone Encounter (Signed)
Can you review these labs for pt?

## 2020-09-27 NOTE — Telephone Encounter (Signed)
Asking for lab results since her appt was moved out to April.

## 2020-09-29 NOTE — Telephone Encounter (Signed)
Cholesterol has improved slightly but is still too high, she remains at increased risk for heart disease based o the numbers, she has not tolerated all medications tried, she needs to continue to work on diet , esp remind hr that nuts, cheese, egg yolk , all oils and butter and lard are high in fat and need to be avoided Blood sugar increased slightly, so needs to increase green , orange , red veges, reduce sugar, white foods and bread and biscuits, drink only water   GOOD is kidney and liver function are NORMAL

## 2020-09-30 NOTE — Telephone Encounter (Signed)
Pt informed

## 2020-11-14 ENCOUNTER — Encounter: Payer: Self-pay | Admitting: Family Medicine

## 2020-11-14 ENCOUNTER — Other Ambulatory Visit: Payer: Self-pay

## 2020-11-14 ENCOUNTER — Ambulatory Visit (INDEPENDENT_AMBULATORY_CARE_PROVIDER_SITE_OTHER): Payer: Medicare Other | Admitting: Family Medicine

## 2020-11-14 VITALS — BP 129/77 | HR 67 | Resp 16 | Ht 66.0 in | Wt 180.1 lb

## 2020-11-14 DIAGNOSIS — E785 Hyperlipidemia, unspecified: Secondary | ICD-10-CM

## 2020-11-14 DIAGNOSIS — E1169 Type 2 diabetes mellitus with other specified complication: Secondary | ICD-10-CM

## 2020-11-14 DIAGNOSIS — Z1231 Encounter for screening mammogram for malignant neoplasm of breast: Secondary | ICD-10-CM

## 2020-11-14 DIAGNOSIS — Z0000 Encounter for general adult medical examination without abnormal findings: Secondary | ICD-10-CM | POA: Diagnosis not present

## 2020-11-14 NOTE — Assessment & Plan Note (Signed)

## 2020-11-14 NOTE — Progress Notes (Signed)
    Taylor Mitchell     MRN: [id number]      DOB: 1940/09/20  HPI: Patient is in for annual physical exam. Generalized arthritis is unchanged, she uses OTC medication soradically. Recent labs,  are reviewed. Immunization is reviewed , and  updated if needed.   PE: BP 129/77   Pulse 67   Resp 16   Ht 5\' 6"  (1.676 m)   Wt 180 lb 1.9 oz (81.7 kg)   SpO2 96%   BMI 29.07 kg/m  Pleasant  female, alert and oriented x 3, in no cardio-pulmonary distress. Afebrile. HEENT No facial trauma or asymetry. Sinuses non tender.  Extra occullar muscles intact.. External ears normal, . Neck: supple, no adenopathy,JVD or thyromegaly.No bruits.  Chest: Clear to ascultation bilaterally.No crackles or wheezes. Non tender to palpation  Breast: No asymetry,no masses or lumps. No tenderness. No nipple discharge or inversion. No axillary or supraclavicular adenopathy  Cardiovascular system; Heart sounds normal,  S1 and  S2 ,no S3.  No murmur, or thrill. Apical beat not displaced Peripheral pulses normal.  Abdomen: Soft, non tender, no organomegaly or masses. No bruits. Bowel sounds normal. No guarding, tenderness or rebound.      Musculoskeletal exam: Full ROM of spine, hips , shoulders and knees. No deformity ,swelling or crepitus noted. No muscle wasting or atrophy.   Neurologic: Cranial nerves 2 to 12 intact. Power, tone ,sensation and reflexes normal throughout. No disturbance in gait. No tremor.  Skin: Intact, no ulceration, erythema , scaling or rash noted. Pigmentation normal throughout  Psych; Normal mood and affect. Judgement and concentration normal   Assessment & Plan:  Encounter for annual physical exam Annual exam as documented. Counseling done  re healthy lifestyle involving commitment to 150 minutes exercise per week, heart healthy diet, and attaining healthy weight.The importance of adequate sleep also discussed. Regular seat belt use and home safety, is also  discussed. Changes in health habits are decided on by the patient with goals and time frames  set for achieving them. Immunization and cancer screening needs are specifically addressed at this visit.

## 2020-11-14 NOTE — Patient Instructions (Addendum)
F/U early September, call if you need me  Before, flu vaccine at visit  Please schedule mammogram morning appointment , not on Wednesday in [place], in June when due , at checkout   Please reduce egg yolk, cheese, butter , bread  Fasting lipid, cmp and EGr, hBa1C 1 week before mexyrt visit  It is important that you exercise regularly at least 30 minutes 5 times a week. If you develop chest pain, have severe difficulty breathing, or feel very tired, stop exercising immediately and seek medical attention   Thanks for choosing [facility], we consider it a privelige to serve you.

## 2020-11-30 DIAGNOSIS — M9905 Segmental and somatic dysfunction of pelvic region: Secondary | ICD-10-CM | POA: Diagnosis not present

## 2020-11-30 DIAGNOSIS — M546 Pain in thoracic spine: Secondary | ICD-10-CM | POA: Diagnosis not present

## 2020-11-30 DIAGNOSIS — M9902 Segmental and somatic dysfunction of thoracic region: Secondary | ICD-10-CM | POA: Diagnosis not present

## 2020-11-30 DIAGNOSIS — M9903 Segmental and somatic dysfunction of lumbar region: Secondary | ICD-10-CM | POA: Diagnosis not present

## 2021-02-02 ENCOUNTER — Other Ambulatory Visit: Payer: Self-pay

## 2021-02-02 ENCOUNTER — Ambulatory Visit
Admission: RE | Admit: 2021-02-02 | Discharge: 2021-02-02 | Disposition: A | Discharge status: Home / Self Care | Payer: Medicare Other | Source: Ambulatory Visit | Attending: Family Medicine | Admitting: Family Medicine

## 2021-02-02 DIAGNOSIS — Z1231 Encounter for screening mammogram for malignant neoplasm of breast: Secondary | ICD-10-CM | POA: Insufficient documentation

## 2021-02-09 DIAGNOSIS — X32XXXA Exposure to sunlight, initial encounter: Secondary | ICD-10-CM | POA: Diagnosis not present

## 2021-02-09 DIAGNOSIS — D225 Melanocytic nevi of trunk: Secondary | ICD-10-CM | POA: Diagnosis not present

## 2021-02-09 DIAGNOSIS — L821 Other seborrheic keratosis: Secondary | ICD-10-CM | POA: Diagnosis not present

## 2021-02-09 DIAGNOSIS — L570 Actinic keratosis: Secondary | ICD-10-CM | POA: Diagnosis not present

## 2021-02-22 ENCOUNTER — Other Ambulatory Visit: Payer: Self-pay

## 2021-02-22 ENCOUNTER — Ambulatory Visit (INDEPENDENT_AMBULATORY_CARE_PROVIDER_SITE_OTHER): Payer: Medicare Other | Admitting: *Deleted

## 2021-02-22 VITALS — Ht 66.0 in | Wt 180.0 lb

## 2021-02-22 DIAGNOSIS — Z0000 Encounter for general adult medical examination without abnormal findings: Secondary | ICD-10-CM

## 2021-02-22 NOTE — Patient Instructions (Signed)
Taylor Mitchell , Thank you for taking time to come for your Medicare Wellness Visit. I appreciate your ongoing commitment to your health goals. Please review the following plan we discussed and let me know if I can assist you in the future.   Screening recommendations/referrals: Colonoscopy: Due now patient will discuss with Dr Moshe Cipro as she was told not due anymore Mammogram: 02-03-22 Bone Density: Does not want to do anymore not taking any medicines Recommended yearly ophthalmology/optometry visit for glaucoma screening and checkup Recommended yearly dental visit for hygiene and checkup  Vaccinations: Influenza vaccine: 03-13-21 Pneumococcal vaccine: Completed Tdap vaccine: Due now Shingles vaccine: Due now patient doesn't want to take this     Advanced directives: Patient declined information  Conditions/risks identified: Diabetes  Next appointment: 1 Year    Preventive Care 77 Years and Older, Female Preventive care refers to lifestyle choices and visits with your health care provider that can promote health and wellness. What does preventive care include? A yearly physical exam. This is also called an annual well check. Dental exams once or twice a year. Routine eye exams. Ask your health care provider how often you should have your eyes checked. Personal lifestyle choices, including: Daily care of your teeth and gums. Regular physical activity. Eating a healthy diet. Avoiding tobacco and drug use. Limiting alcohol use. Practicing safe sex. Taking low-dose aspirin every day. Taking vitamin and mineral supplements as recommended by your health care provider. What happens during an annual well check? The services and screenings done by your health care provider during your annual well check will depend on your age, overall health, lifestyle risk factors, and family history of disease. Counseling  Your health care provider may ask you questions about your: Alcohol use. Tobacco  use. Drug use. Emotional well-being. Home and relationship well-being. Sexual activity. Eating habits. History of falls. Memory and ability to understand (cognition). Work and work Statistician. Reproductive health. Screening  You may have the following tests or measurements: Height, weight, and BMI. Blood pressure. Lipid and cholesterol levels. These may be checked every 5 years, or more frequently if you are over 37 years old. Skin check. Lung cancer screening. You may have this screening every year starting at age 44 if you have a 30-pack-year history of smoking and currently smoke or have quit within the past 15 years. Fecal occult blood test (FOBT) of the stool. You may have this test every year starting at age 25. Flexible sigmoidoscopy or colonoscopy. You may have a sigmoidoscopy every 5 years or a colonoscopy every 10 years starting at age 23. Hepatitis C blood test. Hepatitis B blood test. Sexually transmitted disease (STD) testing. Diabetes screening. This is done by checking your blood sugar (glucose) after you have not eaten for a while (fasting). You may have this done every 1-3 years. Bone density scan. This is done to screen for osteoporosis. You may have this done starting at age 60. Mammogram. This may be done every 1-2 years. Talk to your health care provider about how often you should have regular mammograms. Talk with your health care provider about your test results, treatment options, and if necessary, the need for more tests. Vaccines  Your health care provider may recommend certain vaccines, such as: Influenza vaccine. This is recommended every year. Tetanus, diphtheria, and acellular pertussis (Tdap, Td) vaccine. You may need a Td booster every 10 years. Zoster vaccine. You may need this after age 60. Pneumococcal 13-valent conjugate (PCV13) vaccine. One dose is recommended after  age 45. Pneumococcal polysaccharide (PPSV23) vaccine. One dose is recommended  after age 60. Talk to your health care provider about which screenings and vaccines you need and how often you need them. This information is not intended to replace advice given to you by your health care provider. Make sure you discuss any questions you have with your health care provider. Document Released: 08/26/2015 Document Revised: 04/18/2016 Document Reviewed: 05/31/2015 Elsevier Interactive Patient Education  2017 [place] Prevention in the Home Falls can cause injuries. They can happen to people of all ages. There are many things you can do to make your home safe and to help prevent falls. What can I do on the outside of my home? Regularly fix the edges of walkways and driveways and fix any cracks. Remove anything that might make you trip as you walk through a door, such as a raised step or threshold. Trim any bushes or trees on the path to your home. Use bright outdoor lighting. Clear any walking paths of anything that might make someone trip, such as rocks or tools. Regularly check to see if handrails are loose or broken. Make sure that both sides of any steps have handrails. Any raised decks and porches should have guardrails on the edges. Have any leaves, snow, or ice cleared regularly. Use sand or salt on walking paths during winter. Clean up any spills in your garage right away. This includes oil or grease spills. What can I do in the bathroom? Use night lights. Install grab bars by the toilet and in the tub and shower. Do not use towel bars as grab bars. Use non-skid mats or decals in the tub or shower. If you need to sit down in the shower, use a plastic, non-slip stool. Keep the floor dry. Clean up any water that spills on the floor as soon as it happens. Remove soap buildup in the tub or shower regularly. Attach bath mats securely with double-sided non-slip rug tape. Do not have throw rugs and other things on the floor that can make you trip. What can I do  in the bedroom? Use night lights. Make sure that you have a light by your bed that is easy to reach. Do not use any sheets or blankets that are too big for your bed. They should not hang down onto the floor. Have a firm chair that has side arms. You can use this for support while you get dressed. Do not have throw rugs and other things on the floor that can make you trip. What can I do in the kitchen? Clean up any spills right away. Avoid walking on wet floors. Keep items that you use a lot in easy-to-reach places. If you need to reach something above you, use a strong step stool that has a grab bar. Keep electrical cords out of the way. Do not use floor polish or wax that makes floors slippery. If you must use wax, use non-skid floor wax. Do not have throw rugs and other things on the floor that can make you trip. What can I do with my stairs? Do not leave any items on the stairs. Make sure that there are handrails on both sides of the stairs and use them. Fix handrails that are broken or loose. Make sure that handrails are as long as the stairways. Check any carpeting to make sure that it is firmly attached to the stairs. Fix any carpet that is loose or worn. Avoid having throw rugs  at the top or bottom of the stairs. If you do have throw rugs, attach them to the floor with carpet tape. Make sure that you have a light switch at the top of the stairs and the bottom of the stairs. If you do not have them, ask someone to add them for you. What else can I do to help prevent falls? Wear shoes that: Do not have high heels. Have rubber bottoms. Are comfortable and fit you well. Are closed at the toe. Do not wear sandals. If you use a stepladder: Make sure that it is fully opened. Do not climb a closed stepladder. Make sure that both sides of the stepladder are locked into place. Ask someone to hold it for you, if possible. Clearly mark and make sure that you can see: Any grab bars or  handrails. First and last steps. Where the edge of each step is. Use tools that help you move around (mobility aids) if they are needed. These include: Canes. Walkers. Scooters. Crutches. Turn on the lights when you go into a dark area. Replace any light bulbs as soon as they burn out. Set up your furniture so you have a clear path. Avoid moving your furniture around. If any of your floors are uneven, fix them. If there are any pets around you, be aware of where they are. Review your medicines with your doctor. Some medicines can make you feel dizzy. This can increase your chance of falling. Ask your doctor what other things that you can do to help prevent falls. This information is not intended to replace advice given to you by your health care provider. Make sure you discuss any questions you have with your health care provider. Document Released: 05/26/2009 Document Revised: 01/05/2016 Document Reviewed: 09/03/2014 Elsevier Interactive Patient Education  2017 [employer].

## 2021-02-22 NOTE — Progress Notes (Signed)
Subjective:   Taylor Mitchell is a 80 y.o. female who presents for Medicare Annual (Subsequent) preventive examination.  Review of Systems           Objective:    There were no vitals filed for this visit. There is no height or weight on file to calculate BMI.  Advanced Directives 02/22/2020 01/28/2018 06/25/2017 06/04/2017 06/03/2017 11/15/2016 02/21/2016  Does Patient Have a Medical Advance Directive? No No No No No No No  Would patient like information on creating a medical advance directive? Yes (ED - Information included in AVS) No - Patient declined No - Patient declined - - No - Patient declined No - patient declined information    Current Medications (verified) Outpatient Encounter Medications as of 02/22/2021  Medication Sig   ALPRAZolam (XANAX) 0.25 MG tablet Take one half to one tablet three times weekly, as needed, for anxiety   aspirin 81 MG EC tablet Take 81 mg by mouth every other day.   fluticasone (FLONASE) 50 MCG/ACT nasal spray Place 2 sprays into both nostrils daily as needed for allergies.    loratadine (CLARITIN) 10 MG tablet Take 10 mg by mouth daily as needed for allergies.    Misc Natural Products (WHITE [place]OW BARK PO) Take 400 mg by mouth daily as needed (for pain).    mometasone (ELOCON) 0.1 % lotion Place 1 drop in affected ear (s0 twice daily as needed, for excessive itch   Multiple Vitamins-Minerals (CENTRUM SILVER 50+WOMEN) TABS Take 1 tablet by mouth daily. When she remembers   Omega-3 Fatty Acids (FISH OIL) 1200 MG CAPS Take 2,400 mg by mouth daily.   No facility-administered encounter medications on file as of 02/22/2021.    Allergies (verified) Statins, Metoprolol, Praluent [alirocumab], Pseudoephedrine, Repatha [evolocumab], Zetia [ezetimibe], and Corticosteroids   History: Past Medical History:  Diagnosis Date   Allergic rhinitis    Anxiety    Chronic back pain    Seeing chiropractor for almost 56 years, fell off a tractor at age 37    Diabetes mellitus, type 2 ([place])    Prediabetic, diet controlled   Dyspepsia    Hearing loss    Hyperlipidemia    Intolerant of all medication   Pain and swelling of knee, left 09/02/2018   Peripheral neuropathy    Stroke [facility]) 2015   Vitamin D deficiency    Past Surgical History:  Procedure Laterality Date   CATARACT EXTRACTION W/PHACO Left 06/04/2017   Procedure: CATARACT EXTRACTION PHACO AND INTRAOCULAR LENS PLACEMENT ([place]);  Surgeon: Rutherford Guys, MD;  Location: [facility] ORS;  Service: Ophthalmology;  Laterality: Left;  CDE: 3.54   CATARACT EXTRACTION W/PHACO Right 06/25/2017   Procedure: CATARACT EXTRACTION PHACO AND INTRAOCULAR LENS PLACEMENT ([facility]);  Surgeon: Rutherford Guys, MD;  Location: [facility] ORS;  Service: Ophthalmology;  Laterality: Right;  CDE: 7.86   COLONOSCOPY N/A 02/21/2016   Procedure: COLONOSCOPY;  Surgeon: Aviva Signs, MD;  Location: [facility] ENDO SUITE;  Service: Gastroenterology;  Laterality: N/A;   INGUINAL HERNIA REPAIR Right 2004 approx   NASAL SEPTUM SURGERY  1967   OOPHORECTOMY Bilateral    PARTIAL HYSTERECTOMY     TONSILLECTOMY     Family History  Problem Relation Age of Onset   Ovarian cancer Mother    Kidney disease Brother    Kidney failure Brother    Cirrhosis Sister    Bladder Cancer Father    Heart attack Sister    Heart attack Sister    Heart attack Brother  Breast cancer Neg Hx    Social History   Socioeconomic History   Marital status: Married    Spouse name: Not on file   Number of children: Not on file   Years of education: Not on file   Highest education level: Not on file  Occupational History   Not on file  Tobacco Use   Smoking status: Former    Packs/day: 1.00    Years: 20.00    Pack years: 20.00    Types: Cigarettes    Start date: 08/14/1955    Quit date: 08/14/1983    Years since quitting: 37.5   Smokeless tobacco: Current    Types: Snuff  Vaping Use   Vaping Use: Never used  Substance and Sexual Activity   Alcohol use: No     Alcohol/week: 0.0 standard drinks   Drug use: No   Sexual activity: Never  Other Topics Concern   Not on file  Social History Narrative   Not on file   Social Determinants of Health   Financial Resource Strain: Low Risk    Difficulty of Paying Living Expenses: Not hard at all  Food Insecurity: No Food Insecurity   Worried About Charity fundraiser in the Last Year: Never true   [place] in the Last Year: Never true  Transportation Needs: No Transportation Needs   Lack of Transportation (Medical): No   Lack of Transportation (Non-Medical): No  Physical Activity: Insufficiently Active   Days of Exercise per Week: 3 days   Minutes of Exercise per Session: 10 min  Stress: No Stress Concern Present   Feeling of Stress : Not at all  Social Connections: Moderately Integrated   Frequency of Communication with Friends and Family: More than three times a week   Frequency of Social Gatherings with Friends and Family: More than three times a week   Attends Religious Services: More than 4 times per year   Active Member of [employer] or Organizations: No   Attends Archivist Meetings: Never   Marital Status: Married    Tobacco Counseling Ready to quit: Not Answered Counseling given: Not Answered   Clinical Intake:                 Diabetic?Yes         Activities of Daily Living No flowsheet data found.  Patient Care Team: Fayrene Helper, MD as PCP - General Domenic Polite Aloha Gell, MD as PCP - Cardiology (Cardiology) Allyn Kenner, MD as Consulting Physician (Dermatology)  Indicate any recent Medical Services you may have received from other than [facility] providers in the past year (date may be approximate).     Assessment:   This is a routine wellness examination for Taylor Mitchell.  Hearing/Vision screen No results found.  Dietary issues and exercise activities discussed:     Goals Addressed   None   Depression Screen PHQ 2/9 Scores 11/14/2020 06/07/2020  02/22/2020 01/30/2019 09/02/2018 04/01/2018 03/26/2018  PHQ - 2 Score 0 0 0 0 0 0 0  PHQ- 9 Score - - - - - 2 -    Fall Risk Fall Risk  11/14/2020 06/07/2020 04/08/2019 01/30/2019 09/02/2018  Falls in the past year? 0 0 0 0 0  Number falls in past yr: 0 - 0 - 0  Comment - - - - -  Injury with Fall? 0 - 0 0 0  Comment - - - - -  Risk for fall due to : - - - - -  Follow up - - - - -  Comment - - - - -    FALL RISK PREVENTION PERTAINING TO THE HOME:  Any stairs in or around the home? Yes  If so, are there any without handrails? Yes  Home free of loose throw rugs in walkways, pet beds, electrical cords, etc? Yes  Adequate lighting in your home to reduce risk of falls? Yes   ASSISTIVE DEVICES UTILIZED TO PREVENT FALLS:  Life alert? Yes  Use of a cane, walker or w/c? No  Grab bars in the bathroom? Yes  Shower chair or bench in shower? Yes  Elevated toilet seat or a handicapped toilet? Yes   TIMED UP AND GO:  Was the test performed? No .  Length of time to ambulate 10 feet: NA sec.     Cognitive Function:     6CIT Screen 02/22/2020 01/30/2019 01/28/2018 11/15/2016  What Year? 0 points 0 points 0 points 0 points  What month? 0 points 0 points 0 points 0 points  What time? 0 points 0 points 0 points 0 points  Count back from 20 0 points 0 points 0 points 0 points  Months in reverse 2 points 0 points 0 points 0 points  Repeat phrase 0 points 0 points 0 points 0 points  Total Score 2 0 0 0    Immunizations Immunization History  Administered Date(s) Administered   Fluad Quad(high Dose 65+) 05/18/2019, 06/07/2020   Influenza Split 04/17/2012, 05/14/2013   Influenza,inj,Quad PF,6+ Mos 05/18/2014, 05/02/2015, 06/19/2016, 05/27/2017, 05/15/2018   Moderna Sars-Covid-2 Vaccination 08/28/2019, 09/28/2019   Pneumococcal Conjugate-13 03/02/2014   Pneumococcal Polysaccharide-23 09/19/2015   Td 10/28/2008    TDAP status: Due, Education has been provided regarding the importance of this  vaccine. Advised may receive this vaccine at local pharmacy or Health Dept. Aware to provide a copy of the vaccination record if obtained from local pharmacy or Health Dept. Verbalized acceptance and understanding.  Flu Vaccine status: Up to date  Pneumococcal vaccine status: Up to date  Covid-19 vaccine status: Completed vaccines  Qualifies for Shingles Vaccine? Yes   Zostavax completed No   Shingrix Completed?: No.    Education has been provided regarding the importance of this vaccine. Patient has been advised to call insurance company to determine out of pocket expense if they have not yet received this vaccine. Advised may also receive vaccine at local pharmacy or Health Dept. Verbalized acceptance and understanding.  Screening Tests Health Maintenance  Topic Date Due   Zoster Vaccines- Shingrix (1 of 2) Never done   COVID-19 Vaccine (3 - Booster for Moderna series) 02/25/2020   COLONOSCOPY (Pts 45-39yrs Insurance coverage will need to be confirmed)  02/20/2021   TETANUS/TDAP  11/10/2021 (Originally 10/29/2018)   INFLUENZA VACCINE  03/13/2021   HEMOGLOBIN A1C  03/16/2021   DEXA SCAN  Completed   Hepatitis C Screening  Completed   PNA vac Low Risk Adult  Completed   HPV VACCINES  Aged Out    Health Maintenance  Health Maintenance Due  Topic Date Due   Zoster Vaccines- Shingrix (1 of 2) Never done   COVID-19 Vaccine (3 - Booster for Moderna series) 02/25/2020   COLONOSCOPY (Pts 45-15yrs Insurance coverage will need to be confirmed)  02/20/2021    Colorectal cancer screening: Type of screening: Colonoscopy. Completed 02-21-16. Repeat every 5 years  Mammogram status: Completed 02-02-21. Repeat every year    Lung Cancer Screening: (Low Dose CT Chest recommended if Age 66-80 years, 30 pack-year  currently smoking OR have quit w/in 15years.) does not qualify.   Lung Cancer Screening Referral: No  Additional Screening:  Hepatitis C Screening: does qualify; Completed  06-02-20  Vision Screening: Recommended annual ophthalmology exams for early detection of glaucoma and other disorders of the eye. Is the patient up to date with their annual eye exam?  No  Who is the provider or what is the name of the office in which the patient attends annual eye exams? Dr Gershon Crane has appt coming up in June  If pt is not established with a provider, would they like to be referred to a provider to establish care? No .   Dental Screening: Recommended annual dental exams for proper oral hygiene  Community Resource Referral / Chronic Care Management: CRR required this visit?  No   CCM required this visit?  No      Plan:     I have personally reviewed and noted the following in the patient's chart:   Medical and social history Use of alcohol, tobacco or illicit drugs  Current medications and supplements including opioid prescriptions.  Functional ability and status Nutritional status Physical activity Advanced directives List of other physicians Hospitalizations, surgeries, and ER visits in previous 12 months Vitals Screenings to include cognitive, depression, and falls Referrals and appointments  In addition, I have reviewed and discussed with patient certain preventive protocols, quality metrics, and best practice recommendations. A written personalized care plan for preventive services as well as general preventive health recommendations were provided to patient.     Shelda Altes, CMA   02/22/2021   Nurse Notes:

## 2021-03-23 DIAGNOSIS — Z961 Presence of intraocular lens: Secondary | ICD-10-CM | POA: Diagnosis not present

## 2021-03-23 DIAGNOSIS — E119 Type 2 diabetes mellitus without complications: Secondary | ICD-10-CM | POA: Diagnosis not present

## 2021-03-23 DIAGNOSIS — H52203 Unspecified astigmatism, bilateral: Secondary | ICD-10-CM | POA: Diagnosis not present

## 2021-03-23 DIAGNOSIS — H524 Presbyopia: Secondary | ICD-10-CM | POA: Diagnosis not present

## 2021-03-23 LAB — HM DIABETES EYE EXAM

## 2021-04-18 DIAGNOSIS — E1169 Type 2 diabetes mellitus with other specified complication: Secondary | ICD-10-CM | POA: Diagnosis not present

## 2021-04-18 DIAGNOSIS — E785 Hyperlipidemia, unspecified: Secondary | ICD-10-CM | POA: Diagnosis not present

## 2021-04-19 LAB — LIPID PANEL
Chol/HDL Ratio: 6.9 ratio — ABNORMAL HIGH (ref 0.0–4.4)
Cholesterol, Total: 332 mg/dL — ABNORMAL HIGH (ref 100–199)
HDL: 48 mg/dL (ref >39)
LDL Chol Calc (NIH): 257 mg/dL — ABNORMAL HIGH (ref 0–99)
Triglycerides: 141 mg/dL (ref 0–149)
VLDL Cholesterol Cal: 27 mg/dL (ref 5–40)

## 2021-04-19 LAB — CMP14+EGFR
ALT: 11 IU/L (ref 0–32)
AST: 14 IU/L (ref 0–40)
Albumin/Globulin Ratio: 1.8 (ref 1.2–2.2)
Albumin: 4.5 g/dL (ref 3.7–4.7)
Alkaline Phosphatase: 60 IU/L (ref 44–121)
BUN/Creatinine Ratio: 20 (ref 12–28)
BUN: 16 mg/dL (ref 8–27)
Bilirubin Total: 0.4 mg/dL (ref 0.0–1.2)
CO2: 21 mmol/L (ref 20–29)
Calcium: 9.8 mg/dL (ref 8.7–10.3)
Chloride: 102 mmol/L (ref 96–106)
Creatinine, Ser: 0.82 mg/dL (ref 0.57–1.00)
Globulin, Total: 2.5 g/dL (ref 1.5–4.5)
Glucose: 153 mg/dL — ABNORMAL HIGH (ref 65–99)
Potassium: 4.4 mmol/L (ref 3.5–5.2)
Sodium: 141 mmol/L (ref 134–144)
Total Protein: 7 g/dL (ref 6.0–8.5)
eGFR: 73 mL/min/{1.73_m2} (ref >59)

## 2021-04-19 LAB — HEMOGLOBIN A1C
Est. average glucose Bld gHb Est-mCnc: 160 mg/dL
Hgb A1c MFr Bld: 7.2 % — ABNORMAL HIGH (ref 4.8–5.6)

## 2021-04-20 ENCOUNTER — Ambulatory Visit (INDEPENDENT_AMBULATORY_CARE_PROVIDER_SITE_OTHER): Payer: Medicare Other | Admitting: Family Medicine

## 2021-04-20 ENCOUNTER — Encounter: Payer: Self-pay | Admitting: Family Medicine

## 2021-04-20 ENCOUNTER — Other Ambulatory Visit: Payer: Self-pay

## 2021-04-20 VITALS — BP 109/67 | HR 67 | Resp 16 | Ht 66.0 in | Wt 179.0 lb

## 2021-04-20 DIAGNOSIS — E785 Hyperlipidemia, unspecified: Secondary | ICD-10-CM | POA: Diagnosis not present

## 2021-04-20 DIAGNOSIS — E8881 Metabolic syndrome: Secondary | ICD-10-CM

## 2021-04-20 DIAGNOSIS — J3089 Other allergic rhinitis: Secondary | ICD-10-CM

## 2021-04-20 DIAGNOSIS — E663 Overweight: Secondary | ICD-10-CM

## 2021-04-20 DIAGNOSIS — Z23 Encounter for immunization: Secondary | ICD-10-CM

## 2021-04-20 DIAGNOSIS — E1169 Type 2 diabetes mellitus with other specified complication: Secondary | ICD-10-CM | POA: Diagnosis not present

## 2021-04-20 MED ORDER — UNABLE TO FIND: 0 refills | Status: AC

## 2021-04-20 MED ORDER — UNABLE TO FIND
3 refills | Status: AC
Start: 2021-04-20 — End: ?

## 2021-04-20 MED ORDER — UNABLE TO FIND
1 refills | Status: AC
Start: 2021-04-20 — End: ?

## 2021-04-20 MED ORDER — MOMETASONE FUROATE 0.1 % EX SOLN: CUTANEOUS | 1 refills | Status: AC

## 2021-04-20 NOTE — Patient Instructions (Addendum)
F/U in 3 months, mid December,call if you need me sooner   Flu vaccine today   Fasting lipid, cmp and EGFr, HBA1C 12/6 or shortly after  Nurse please see if we can arrange meter and test strips for once daily by prescription for her she is a diet controlled diet, ask pharmacist if needed, for help  Work on low fat diet and also restirct carbs per meal to 45 g, quick re education at checkout, also provide low fat diet info at checkouy  Medication sent for itchy ears  It is important that you exercise regularly at least 30 minutes 5 times a week. If you develop chest pain, have severe difficulty breathing, or feel very tired, stop exercising immediately and seek medical attention    Thanks for choosing [facility], we consider it a privelige to serve you.

## 2021-04-23 ENCOUNTER — Encounter: Payer: Self-pay | Admitting: Family Medicine

## 2021-04-23 NOTE — Assessment & Plan Note (Signed)
The increased risk of cardiovascular disease associated with this diagnosis, and the need to consistently work on lifestyle to change this is discussed. Following  a  heart healthy diet ,commitment to 30 minutes of exercise at least 5 days per week, as well as control of blood sugar and cholesterol , and achieving a healthy weight are all the areas to be addressed .  

## 2021-04-23 NOTE — Assessment & Plan Note (Signed)
Hyperlipidemia:Low fat diet discussed and encouraged.   Lipid Panel  Lab Results  Component Value Date   CHOL 332 (H) 04/18/2021   HDL 48 04/18/2021   LDLCALC 257 (H) 04/18/2021   TRIG 141 04/18/2021   CHOLHDL 6.9 (H) 04/18/2021  deteriorated,

## 2021-04-23 NOTE — Assessment & Plan Note (Signed)
  Patient re-educated about  the importance of commitment to a  minimum of 150 minutes of exercise per week as able.  The importance of healthy food choices with portion control discussed, as well as eating regularly and within a 12 hour window most days. The need to choose "clean , green" food 50 to 75% of the time is discussed, as well as to make water the primary drink and set a goal of 64 ounces water daily.    Weight /BMI 04/20/2021 02/22/2021 11/14/2020  WEIGHT 179 lb 180 lb 180 lb 1.9 oz  HEIGHT '5\' 6"'$  '5\' 6"'$  '5\' 6"'$   BMI 28.89 kg/m2 29.05 kg/m2 29.07 kg/m2

## 2021-04-23 NOTE — Assessment & Plan Note (Addendum)
Taylor Mitchell is reminded of the importance of commitment to daily physical activity for 30 minutes or more, as able and the need to limit carbohydrate intake to 30 to 60 grams per meal to help with blood sugar control.  Deteriorated, medication recommended ,but she is currently desisting  Taylor Mitchell is reminded of the importance of daily foot exam, annual eye examination, and good blood sugar, blood pressure and cholesterol control.  Diabetic Labs Latest Ref Rng & Units 04/18/2021 09/16/2020 06/02/2020 02/19/2020 09/02/2019  HbA1c 4.8 - 5.6 % 7.2(H) 6.5(H) 6.4(H) 7.0(H) 6.9(H)  Microalbumin mg/dL - - - 0.8 -  Chol 100 - 199 mg/dL 332(H) 333(H) 314(H) 346(H) 230(H)  HDL >39 mg/dL 48 53 47(L) 49(L) 52  Calc LDL 0 - 99 mg/dL 257(H) 249(H) 237(H) 258(H) 158(H)  Triglycerides 0 - 149 mg/dL 141 163(H) 144 205(H) 91  Creatinine 0.57 - 1.00 mg/dL 0.82 0.78 0.70 0.76 0.79   BP/Weight 04/20/2021 02/22/2021 11/14/2020 06/07/2020 02/23/2020 02/22/2020 [id number]  Systolic BP 0000000 - Q000111Q 0000000 123XX123 123XX123 [id number]  Diastolic BP 67 - 77 61 73 72 72  Wt. (Lbs) 179 180 180.12 172.04 185 186 186  BMI 28.89 29.05 29.07 27.77 29.86 30.02 30.02   Foot/eye exam completion dates Latest Ref Rng & Units 03/23/2021 03/03/2020  Eye Exam No Retinopathy No Retinopathy No Retinopathy  Foot Form Completion - - -

## 2021-04-23 NOTE — Progress Notes (Signed)
Taylor Mitchell     MRN: [id number]      DOB: 1941/05/17   HPI Taylor Mitchell is here for follow up and re-evaluation of chronic medical conditions, medication management and review of any available recent lab and radiology data.  Preventive health is updated, specifically  Cancer screening and Immunization.   Questions or concerns regarding consultations or procedures which the PT has had in the interim are  addressed. The PT denies any adverse reactions to current medications since the last visit.  C/o itchy ears, and needs medication    ROS Denies recent fever or chills. Denies sinus pressure, nasal congestion,  or sore throat. Denies chest congestion, productive cough or wheezing. Denies chest pains, palpitations and leg swelling Denies abdominal pain, nausea, vomiting,diarrhea or constipation.   Denies dysuria, frequency, hesitancy or incontinence. Denies joint pain, swelling and limitation in mobility. Denies headaches, seizures, numbness, or tingling. Denies depression, anxiety or insomnia. Denies skin break down or rash.   PE  BP 109/67   Pulse 67   Resp 16   Ht '5\' 6"'$  (1.676 m)   Wt 179 lb (81.2 kg)   SpO2 94%   BMI 28.89 kg/m   Patient alert and oriented and in no cardiopulmonary distress.  HEENT: No facial asymmetry, EOMI,     Neck supple .TM clear  Chest: Clear to auscultation bilaterally.  CVS: S1, S2 no murmurs, no S3.Regular rate.  ABD: Soft non tender.   Ext: No edema  MS: Adequate ROM spine, shoulders, hips and knees.  Skin: Intact, no ulcerations or rash noted.  Psych: Good eye contact, normal affect. Memory intact not anxious or depressed appearing.  CNS: CN 2-12 intact, power,  normal throughout.no focal deficits noted.   Assessment & Plan  Hyperlipidemia associated with type 2 diabetes mellitus ([place]) Hyperlipidemia:Low fat diet discussed and encouraged.   Lipid Panel  Lab Results  Component Value Date   CHOL 332 (H) 04/18/2021   HDL 48  04/18/2021   LDLCALC 257 (H) 04/18/2021   TRIG 141 04/18/2021   CHOLHDL 6.9 (H) 04/18/2021  deteriorated,      Type 2 diabetes mellitus with other specified complication ([place]) Taylor Mitchell is reminded of the importance of commitment to daily physical activity for 30 minutes or more, as able and the need to limit carbohydrate intake to 30 to 60 grams per meal to help with blood sugar control.  Deteriorated, medication recommended ,but she is currently desisting  Taylor Mitchell is reminded of the importance of daily foot exam, annual eye examination, and good blood sugar, blood pressure and cholesterol control.  Diabetic Labs Latest Ref Rng & Units 04/18/2021 09/16/2020 06/02/2020 02/19/2020 09/02/2019  HbA1c 4.8 - 5.6 % 7.2(H) 6.5(H) 6.4(H) 7.0(H) 6.9(H)  Microalbumin mg/dL - - - 0.8 -  Chol 100 - 199 mg/dL 332(H) 333(H) 314(H) 346(H) 230(H)  HDL >39 mg/dL 48 53 47(L) 49(L) 52  Calc LDL 0 - 99 mg/dL 257(H) 249(H) 237(H) 258(H) 158(H)  Triglycerides 0 - 149 mg/dL 141 163(H) 144 205(H) 91  Creatinine 0.57 - 1.00 mg/dL 0.82 0.78 0.70 0.76 0.79   BP/Weight 04/20/2021 02/22/2021 11/14/2020 06/07/2020 02/23/2020 02/22/2020 [id number]  Systolic BP 0000000 - Q000111Q 0000000 123XX123 123XX123 [id number]  Diastolic BP 67 - 77 61 73 72 72  Wt. (Lbs) 179 180 180.12 172.04 185 186 186  BMI 28.89 29.05 29.07 27.77 29.86 30.02 30.02   Foot/eye exam completion dates Latest Ref Rng & Units 03/23/2021 03/03/2020  Eye Exam No Retinopathy  No Retinopathy No Retinopathy  Foot Form Completion - - -        Allergic rhinitis Current flare of itchy ears, no infection, medication refilled  Overweight (BMI 25.0-29.9)  Patient re-educated about  the importance of commitment to a  minimum of 150 minutes of exercise per week as able.  The importance of healthy food choices with portion control discussed, as well as eating regularly and within a 12 hour window most days. The need to choose "clean , green" food 50 to 75% of the time is discussed, as well  as to make water the primary drink and set a goal of 64 ounces water daily.    Weight /BMI 04/20/2021 02/22/2021 11/14/2020  WEIGHT 179 lb 180 lb 180 lb 1.9 oz  HEIGHT '5\' 6"'$  '5\' 6"'$  '5\' 6"'$   BMI 28.89 kg/m2 29.05 kg/m2 29.07 kg/m2      Metabolic syndrome X The increased risk of cardiovascular disease associated with this diagnosis, and the need to consistently work on lifestyle to change this is discussed. Following  a  heart healthy diet ,commitment to 30 minutes of exercise at least 5 days per week, as well as control of blood sugar and cholesterol , and achieving a healthy weight are all the areas to be addressed .

## 2021-04-23 NOTE — Assessment & Plan Note (Signed)
Current flare of itchy ears, no infection, medication refilled

## 2021-05-11 DIAGNOSIS — L570 Actinic keratosis: Secondary | ICD-10-CM | POA: Diagnosis not present

## 2021-05-11 DIAGNOSIS — X32XXXD Exposure to sunlight, subsequent encounter: Secondary | ICD-10-CM | POA: Diagnosis not present

## 2021-06-09 DIAGNOSIS — M62830 Muscle spasm of back: Secondary | ICD-10-CM | POA: Diagnosis not present

## 2021-06-09 DIAGNOSIS — M9905 Segmental and somatic dysfunction of pelvic region: Secondary | ICD-10-CM | POA: Diagnosis not present

## 2021-06-09 DIAGNOSIS — M9903 Segmental and somatic dysfunction of lumbar region: Secondary | ICD-10-CM | POA: Diagnosis not present

## 2021-06-09 DIAGNOSIS — M546 Pain in thoracic spine: Secondary | ICD-10-CM | POA: Diagnosis not present

## 2021-06-09 DIAGNOSIS — M9902 Segmental and somatic dysfunction of thoracic region: Secondary | ICD-10-CM | POA: Diagnosis not present

## 2021-06-14 DIAGNOSIS — M62830 Muscle spasm of back: Secondary | ICD-10-CM | POA: Diagnosis not present

## 2021-06-14 DIAGNOSIS — M9905 Segmental and somatic dysfunction of pelvic region: Secondary | ICD-10-CM | POA: Diagnosis not present

## 2021-06-14 DIAGNOSIS — M9903 Segmental and somatic dysfunction of lumbar region: Secondary | ICD-10-CM | POA: Diagnosis not present

## 2021-06-14 DIAGNOSIS — M546 Pain in thoracic spine: Secondary | ICD-10-CM | POA: Diagnosis not present

## 2021-06-14 DIAGNOSIS — M9902 Segmental and somatic dysfunction of thoracic region: Secondary | ICD-10-CM | POA: Diagnosis not present

## 2021-07-24 DIAGNOSIS — E1169 Type 2 diabetes mellitus with other specified complication: Secondary | ICD-10-CM | POA: Diagnosis not present

## 2021-07-24 DIAGNOSIS — E785 Hyperlipidemia, unspecified: Secondary | ICD-10-CM | POA: Diagnosis not present

## 2021-07-25 LAB — LIPID PANEL
Chol/HDL Ratio: 6.6 ratio — ABNORMAL HIGH (ref 0.0–4.4)
Cholesterol, Total: 316 mg/dL — ABNORMAL HIGH (ref 100–199)
HDL: 48 mg/dL (ref >39)
LDL Chol Calc (NIH): 232 mg/dL — ABNORMAL HIGH (ref 0–99)
Triglycerides: 183 mg/dL — ABNORMAL HIGH (ref 0–149)
VLDL Cholesterol Cal: 36 mg/dL (ref 5–40)

## 2021-07-25 LAB — CMP14+EGFR
ALT: 10 IU/L (ref 0–32)
AST: 10 IU/L (ref 0–40)
Albumin/Globulin Ratio: 1.7 (ref 1.2–2.2)
Albumin: 4.2 g/dL (ref 3.7–4.7)
Alkaline Phosphatase: 67 IU/L (ref 44–121)
BUN/Creatinine Ratio: 16 (ref 12–28)
BUN: 13 mg/dL (ref 8–27)
Bilirubin Total: 0.5 mg/dL (ref 0.0–1.2)
CO2: 22 mmol/L (ref 20–29)
Calcium: 9.7 mg/dL (ref 8.7–10.3)
Chloride: 105 mmol/L (ref 96–106)
Creatinine, Ser: 0.83 mg/dL (ref 0.57–1.00)
Globulin, Total: 2.5 g/dL (ref 1.5–4.5)
Glucose: 154 mg/dL — ABNORMAL HIGH (ref 70–99)
Potassium: 4.4 mmol/L (ref 3.5–5.2)
Sodium: 141 mmol/L (ref 134–144)
Total Protein: 6.7 g/dL (ref 6.0–8.5)
eGFR: 72 mL/min/{1.73_m2} (ref >59)

## 2021-07-25 LAB — HEMOGLOBIN A1C
Est. average glucose Bld gHb Est-mCnc: 151 mg/dL
Hgb A1c MFr Bld: 6.9 % — ABNORMAL HIGH (ref 4.8–5.6)

## 2021-07-26 ENCOUNTER — Other Ambulatory Visit: Payer: Self-pay

## 2021-07-26 ENCOUNTER — Ambulatory Visit (INDEPENDENT_AMBULATORY_CARE_PROVIDER_SITE_OTHER): Payer: Medicare Other | Admitting: Family Medicine

## 2021-07-26 ENCOUNTER — Encounter: Payer: Self-pay | Admitting: Family Medicine

## 2021-07-26 VITALS — BP 120/70 | HR 59 | Resp 17 | Ht 66.0 in | Wt 177.1 lb

## 2021-07-26 DIAGNOSIS — E1169 Type 2 diabetes mellitus with other specified complication: Secondary | ICD-10-CM | POA: Diagnosis not present

## 2021-07-26 DIAGNOSIS — L989 Disorder of the skin and subcutaneous tissue, unspecified: Secondary | ICD-10-CM | POA: Diagnosis not present

## 2021-07-26 DIAGNOSIS — E663 Overweight: Secondary | ICD-10-CM

## 2021-07-26 DIAGNOSIS — E559 Vitamin D deficiency, unspecified: Secondary | ICD-10-CM

## 2021-07-26 DIAGNOSIS — E785 Hyperlipidemia, unspecified: Secondary | ICD-10-CM

## 2021-07-26 DIAGNOSIS — J3089 Other allergic rhinitis: Secondary | ICD-10-CM

## 2021-07-26 NOTE — Patient Instructions (Addendum)
Annual exam end April or early May, call if you need me sooner  Cholesterol and blood sugar have improved, keep this up!  Fasting lipid, cmp and eGFR, hBA1C, cBC. tSH and vit D 5 days before next visit  You are referred to Dr Nevada Crane re skin lesion, left nares  Thanks for choosing [facility], we consider it a privelige to serve you.

## 2021-08-01 ENCOUNTER — Encounter: Payer: Self-pay | Admitting: Family Medicine

## 2021-08-01 NOTE — Assessment & Plan Note (Signed)
Taylor Mitchell is reminded of the importance of commitment to daily physical activity for 30 minutes or more, as able and the need to limit carbohydrate intake to 30 to 60 grams per meal to help with blood sugar control.   Improved Taylor Mitchell is reminded of the importance of daily foot exam, annual eye examination, and good blood sugar, blood pressure and cholesterol control.  Diabetic Labs Latest Ref Rng & Units 07/24/2021 04/18/2021 09/16/2020 06/02/2020 02/19/2020  HbA1c 4.8 - 5.6 % 6.9(H) 7.2(H) 6.5(H) 6.4(H) 7.0(H)  Microalbumin mg/dL - - - - 0.8  Chol 100 - 199 mg/dL 316(H) 332(H) 333(H) 314(H) 346(H)  HDL >39 mg/dL 48 48 53 47(L) 49(L)  Calc LDL 0 - 99 mg/dL 232(H) 257(H) 249(H) 237(H) 258(H)  Triglycerides 0 - 149 mg/dL 183(H) 141 163(H) 144 205(H)  Creatinine 0.57 - 1.00 mg/dL 0.83 0.82 0.78 0.70 0.76   BP/Weight 07/26/2021 04/20/2021 02/22/2021 11/14/2020 06/07/2020 02/23/2020 4/40/3474  Systolic BP 259 563 - 875 643 329 518  Diastolic BP 70 67 - 77 61 73 72  Wt. (Lbs) 177.08 179 180 180.12 172.04 185 186  BMI 28.58 28.89 29.05 29.07 27.77 29.86 30.02   Foot/eye exam completion dates Latest Ref Rng & Units 03/23/2021 03/03/2020  Eye Exam No Retinopathy No Retinopathy No Retinopathy  Foot Form Completion - - -

## 2021-08-01 NOTE — Progress Notes (Signed)
Taylor Mitchell     MRN: [id number]      DOB: [birth year]   HPI Taylor Mitchell is here for follow up and re-evaluation of chronic medical conditions, medication management and review of any available recent lab and radiology data.  Preventive health is updated, specifically  Cancer screening and Immunization.   Questions or concerns regarding consultations or procedures which the PT has had in the interim are  addressed. The PT denies any adverse reactions to current medications since the last visit.  C/o scaly lesion on left nasolabial fold, wants derm to re eval Denies polyuria, polydipsia, blurred vision , or hypoglycemic episodes.  ROS Denies recent fever or chills. Denies sinus pressure, nasal congestion, ear pain or sore throat. Denies chest congestion, productive cough or wheezing. Denies chest pains, palpitations and leg swelling Denies abdominal pain, nausea, vomiting,diarrhea or constipation.   Denies dysuria, frequency, hesitancy or incontinence. Denies joint pain, swelling and limitation in mobility. Denies headaches, seizures, numbness, or tingling. Denies depression, anxiety or insomnia.  PE  BP 120/70    Pulse (!) 59    Resp 17    Ht 5\' 6"  (1.676 m)    Wt 177 lb 1.3 oz (80.3 kg)    SpO2 96%    BMI 28.58 kg/m   Patient alert and oriented and in no cardiopulmonary distress.  HEENT: No facial asymmetry, EOMI,     Neck supple .  Chest: Clear to auscultation bilaterally.  CVS: S1, S2 no murmurs, no S3.Regular rate.  ABD: Soft non tender.   Ext: No edema  MS: Adequate ROM spine, shoulders, hips and knees.  Skin: Intact, scaly lesion on left nasolabial fold.  Psych: Good eye contact, normal affect. Memory intact not anxious or depressed appearing.  CNS: CN 2-12 intact, power,  normal throughout.no focal deficits noted.   Assessment & Plan  Hyperlipidemia associated with type 2 diabetes mellitus ([place]) Hyperlipidemia:Low fat diet discussed and  encouraged.   Lipid Panel  Lab Results  Component Value Date   CHOL 316 (H) 07/24/2021   HDL 48 07/24/2021   LDLCALC 232 (H) 07/24/2021   TRIG 183 (H) 07/24/2021   CHOLHDL 6.6 (H) 07/24/2021   Still very elevated but slightly improved, intolerant of alll medication , continue low fat diet    Type 2 diabetes mellitus with other specified complication ([place]) Taylor Mitchell is reminded of the importance of commitment to daily physical activity for 30 minutes or more, as able and the need to limit carbohydrate intake to 30 to 60 grams per meal to help with blood sugar control.   Improved Taylor Mitchell is reminded of the importance of daily foot exam, annual eye examination, and good blood sugar, blood pressure and cholesterol control.  Diabetic Labs Latest Ref Rng & Units 07/24/2021 04/18/2021 09/16/2020 06/02/2020 02/19/2020  HbA1c 4.8 - 5.6 % 6.9(H) 7.2(H) 6.5(H) 6.4(H) 7.0(H)  Microalbumin mg/dL - - - - 0.8  Chol 100 - 199 mg/dL 316(H) 332(H) 333(H) 314(H) 346(H)  HDL >39 mg/dL 48 48 53 47(L) 49(L)  Calc LDL 0 - 99 mg/dL 232(H) 257(H) 249(H) 237(H) 258(H)  Triglycerides 0 - 149 mg/dL 183(H) 141 163(H) 144 205(H)  Creatinine 0.57 - 1.00 mg/dL 0.83 0.82 0.78 0.70 0.76   BP/Weight 07/26/2021 04/20/2021 02/22/2021 11/14/2020 06/07/2020 02/23/2020 08/18/2692  Systolic BP 854 627 - 035 009 381 829  Diastolic BP 70 67 - 77 61 73 72  Wt. (Lbs) 177.08 179 180 180.12 172.04 185 186  BMI 28.58 28.89  29.05 29.07 27.77 29.86 30.02   Foot/eye exam completion dates Latest Ref Rng & Units 03/23/2021 03/03/2020  Eye Exam No Retinopathy No Retinopathy No Retinopathy  Foot Form Completion - - -        Allergic rhinitis Controlled, no change in medication   Overweight (BMI 25.0-29.9)  Patient re-educated about  the importance of commitment to a  minimum of 150 minutes of exercise per week as able.  The importance of healthy food choices with portion control discussed, as well as eating regularly and within a  12 hour window most days. The need to choose "clean , green" food 50 to 75% of the time is discussed, as well as to make water the primary drink and set a goal of 64 ounces water daily.    Weight /BMI 07/26/2021 04/20/2021 02/22/2021  WEIGHT 177 lb 1.3 oz 179 lb 180 lb  HEIGHT 5\' 6"  5\' 6"  5\' 6"   BMI 28.58 kg/m2 28.89 kg/m2 29.05 kg/m2      Skin lesion of face Scaly area on left nasal fold, refer Derm

## 2021-08-01 NOTE — Assessment & Plan Note (Signed)
Scaly area on left nasal fold, refer Derm

## 2021-08-01 NOTE — Assessment & Plan Note (Signed)
Hyperlipidemia:Low fat diet discussed and encouraged.   Lipid Panel  Lab Results  Component Value Date   CHOL 316 (H) 07/24/2021   HDL 48 07/24/2021   LDLCALC 232 (H) 07/24/2021   TRIG 183 (H) 07/24/2021   CHOLHDL 6.6 (H) 07/24/2021   Still very elevated but slightly improved, intolerant of alll medication , continue low fat diet

## 2021-08-01 NOTE — Assessment & Plan Note (Signed)
°  Patient re-educated about  the importance of commitment to a  minimum of 150 minutes of exercise per week as able.  The importance of healthy food choices with portion control discussed, as well as eating regularly and within a 12 hour window most days. The need to choose "clean , green" food 50 to 75% of the time is discussed, as well as to make water the primary drink and set a goal of 64 ounces water daily.    Weight /BMI 07/26/2021 04/20/2021 02/22/2021  WEIGHT 177 lb 1.3 oz 179 lb 180 lb  HEIGHT 5\' 6"  5\' 6"  5\' 6"   BMI 28.58 kg/m2 28.89 kg/m2 29.05 kg/m2

## 2021-08-01 NOTE — Assessment & Plan Note (Signed)
Controlled, no change in medication  

## 2021-08-28 DIAGNOSIS — D0439 Carcinoma in situ of skin of other parts of face: Secondary | ICD-10-CM | POA: Diagnosis not present

## 2021-08-28 DIAGNOSIS — X32XXXD Exposure to sunlight, subsequent encounter: Secondary | ICD-10-CM | POA: Diagnosis not present

## 2021-08-28 DIAGNOSIS — L570 Actinic keratosis: Secondary | ICD-10-CM | POA: Diagnosis not present

## 2021-09-18 DIAGNOSIS — L820 Inflamed seborrheic keratosis: Secondary | ICD-10-CM | POA: Diagnosis not present

## 2021-09-18 DIAGNOSIS — D225 Melanocytic nevi of trunk: Secondary | ICD-10-CM | POA: Diagnosis not present

## 2021-09-18 DIAGNOSIS — Z08 Encounter for follow-up examination after completed treatment for malignant neoplasm: Secondary | ICD-10-CM | POA: Diagnosis not present

## 2021-09-18 DIAGNOSIS — Z85828 Personal history of other malignant neoplasm of skin: Secondary | ICD-10-CM | POA: Diagnosis not present

## 2021-10-16 DIAGNOSIS — L570 Actinic keratosis: Secondary | ICD-10-CM | POA: Diagnosis not present

## 2021-10-16 DIAGNOSIS — X32XXXD Exposure to sunlight, subsequent encounter: Secondary | ICD-10-CM | POA: Diagnosis not present

## 2021-10-16 DIAGNOSIS — C44712 Basal cell carcinoma of skin of right lower limb, including hip: Secondary | ICD-10-CM | POA: Diagnosis not present

## 2021-10-16 DIAGNOSIS — C44719 Basal cell carcinoma of skin of left lower limb, including hip: Secondary | ICD-10-CM | POA: Diagnosis not present

## 2021-11-01 DIAGNOSIS — M546 Pain in thoracic spine: Secondary | ICD-10-CM | POA: Diagnosis not present

## 2021-11-01 DIAGNOSIS — M9905 Segmental and somatic dysfunction of pelvic region: Secondary | ICD-10-CM | POA: Diagnosis not present

## 2021-11-01 DIAGNOSIS — M62830 Muscle spasm of back: Secondary | ICD-10-CM | POA: Diagnosis not present

## 2021-11-01 DIAGNOSIS — M9903 Segmental and somatic dysfunction of lumbar region: Secondary | ICD-10-CM | POA: Diagnosis not present

## 2021-11-01 DIAGNOSIS — M9902 Segmental and somatic dysfunction of thoracic region: Secondary | ICD-10-CM | POA: Diagnosis not present

## 2021-11-24 ENCOUNTER — Ambulatory Visit (INDEPENDENT_AMBULATORY_CARE_PROVIDER_SITE_OTHER): Payer: Medicare Other | Admitting: Internal Medicine

## 2021-11-24 ENCOUNTER — Encounter: Payer: Self-pay | Admitting: Internal Medicine

## 2021-11-24 DIAGNOSIS — U071 COVID-19: Secondary | ICD-10-CM | POA: Diagnosis not present

## 2021-11-24 NOTE — Patient Instructions (Signed)
Please take Mucinex or Robitussin as needed for cough. ?

## 2021-11-24 NOTE — Progress Notes (Signed)
?  ? ?Virtual Visit via Telephone Note  ? ?This visit type was conducted due to national recommendations for restrictions regarding the COVID-19 Pandemic (e.g. social distancing) in an effort to limit this patient's exposure and mitigate transmission in our community.  Due to her co-morbid illnesses, this patient is at least at moderate risk for complications without adequate follow up.  This format is felt to be most appropriate for this patient at this time.  The patient did not have access to video technology/had technical difficulties with video requiring transitioning to audio format only (telephone).  All issues noted in this document were discussed and addressed.  No physical exam could be performed with this format.  ? ?Evaluation Performed:  Follow-up visit ? ?Date:  11/24/2021  ? ?ID:  Taylor Mitchell, DOB [birth year], MRN [id number] ? ?Patient Location: Home ?Provider Location: Office/Clinic ? ?Participants: Patient ?Location of Patient: Home ?Location of Provider: Telehealth ?Consent was obtain for visit to be over via telehealth. ?I verified that I am speaking with the correct person using two identifiers. ? ?PCP:  Fayrene Helper, MD  ? ?Chief Complaint: Cough and wheezing ? ?History of Present Illness:   ? ?Taylor Mitchell is a 81 y.o. female who has a televisit for complaint of cough for the last 10 days.  She tested positive for COVID on 04/06, and states that her cough has been improving since then.  She also reports mild wheezing when she has severe coughing spells, but denies any fever, chills or dyspnea currently. ? ?The patient does have symptoms concerning for COVID-19 infection (fever, chills, cough, or new shortness of breath).  ? ?Past Medical, Surgical, Social History, Allergies, and Medications have been Reviewed. ? ?Past Medical History:  ?Diagnosis Date  ? Allergic rhinitis   ? Anxiety   ? Chronic back pain   ? Seeing chiropractor for almost 73 years, fell off a tractor at age 59  ?  Diabetes mellitus, type 2 ([place])   ? Prediabetic, diet controlled  ? Dyspepsia   ? Hearing loss   ? Hyperlipidemia   ? Intolerant of all medication  ? Pain and swelling of knee, left 09/02/2018  ? Peripheral neuropathy   ? Stroke [facility]) 2015  ? Vitamin D deficiency   ? ?Past Surgical History:  ?Procedure Laterality Date  ? CATARACT EXTRACTION W/PHACO Left 06/04/2017  ? Procedure: CATARACT EXTRACTION PHACO AND INTRAOCULAR LENS PLACEMENT (IOC);  Surgeon: Rutherford Guys, MD;  Location: [facility] OR[facility];  Service: Ophthalmology;  Laterality: Left;  CDE: 3.54  ? CATARACT EXTRACTION W/PHACO Right 06/25/2017  ? Procedure: CATARACT EXTRACTION PHACO AND INTRAOCULAR LENS PLACEMENT (IOC);  Surgeon: Rutherford Guys, MD;  Location: [facility] ORS;  Service: Ophthalmology;  Laterality: Right;  CDE: 7.86  ? COLONOSCOPY N/A 02/21/2016  ? Procedure: COLONOSCOPY;  Surgeon: Aviva Signs, MD;  Location: [facility] SU[facility];  Service: Gastroenterology;  Laterality: N/A;  ? INGUINAL HERNIA REPAIR Right 2004 approx  ? [place]  ? OOPHORECTOMY Bilateral   ? PARTIAL HYSTERECTOMY    ? TONSILLECTOMY    ?  ? ?Current Meds  ?Medication Sig  ? aspirin 81 MG EC tablet Take 81 mg by mouth every other day.  ? fluticasone (FLONASE) 50 MCG/ACT nasal spray Place 2 sprays into both nostrils daily as needed for allergies.   ? loratadine (CLARITIN) 10 MG tablet Take 10 mg by mouth daily as needed for allergies.   ? Misc Natural Products (WHITE WILLOW BARK PO) Take 400 mg  by mouth daily as needed (for pain).   ? mometasone (ELOCON) 0.1 % lotion One drop in each ear twice daily , as needed, for itching  ? Multiple Vitamins-Minerals (CENTRUM SILVER 50+WOMEN) TABS Take 1 tablet by mouth daily. When she remembers  ? Omega-3 Fatty Acids (FISH OIL) 1200 MG CAPS Take 2,400 mg by mouth daily.  ? UNABLE TO FIND Insurance preferred glucometer. Dx: E11.69  ? UNABLE TO FIND Insurance preferred test strips to test once daily. Dx: E11.69  ? UNABLE TO FIND Dispense lancets to test  once daily. Dx: E11.69  ?  ? ?Allergies:   Statins, Metoprolol, Praluent [alirocumab], Pseudoephedrine, Repatha [evolocumab], Zetia [ezetimibe], and Corticosteroids  ? ?ROS:   ?Please see the history of present illness.    ? ?All other systems reviewed and are negative. ? ? ?Labs/Other Tests and Data Reviewed:   ? ?Recent Labs: ?07/24/2021: ALT 10; BUN 13; Creatinine, Ser 0.83; Potassium 4.4; Sodium 141  ? ?Recent Lipid Panel ?Lab Results  ?Component Value Date/Time  ? CHOL 316 (H) 07/24/2021 08:41 AM  ? TRIG 183 (H) 07/24/2021 08:41 AM  ? HDL 48 07/24/2021 08:41 AM  ? CHOLHDL 6.6 (H) 07/24/2021 08:41 AM  ? CHOLHDL 6.7 (H) 06/02/2020 08:47 AM  ? LDLCALC 232 (H) 07/24/2021 08:41 AM  ? LDLCALC 237 (H) 06/02/2020 08:47 AM  ? ? ?Wt Readings from Last 3 Encounters:  ?07/26/21 177 lb 1.3 oz (80.3 kg)  ?04/20/21 179 lb (81.2 kg)  ?02/22/21 180 lb (81.6 kg)  ?  ? ?ASSESSMENT & PLAN:   ? ?COVID-19 infection ?Her cough is likely inflammatory at this stage ?She is afebrile ?Offered Medrol Dosepak, but she prefers to avoid any medication ?Has been trying green tea to help with cough, which has helped her ?Advised to contact if she has persistent or worsening symptoms ? ?Time:   ?Today, I have spent 9 minutes reviewing the chart, including problem list, medications, and with the patient with telehealth technology discussing the above problems. ? ? ?Medication Adjustments/Labs and Tests Ordered: ?Current medicines are reviewed at length with the patient today.  Concerns regarding medicines are outlined above.  ? ?Tests Ordered: ?No orders of the defined types were placed in this encounter. ? ? ?Medication Changes: ?No orders of the defined types were placed in this encounter. ? ? ? ?Note: This dictation was prepared with Dragon dictation along with smaller phrase technology. Similar sounding words can be transcribed inadequately or may not be corrected upon review. Any transcriptional errors that result from this process are  unintentional.  ?  ? ? ?Disposition:  Follow up  ?Signed, ?Lindell Spar, MD  ?11/24/2021 9:37 AM    ? ?[facility] ?[facility] ?

## 2021-11-27 DIAGNOSIS — Z08 Encounter for follow-up examination after completed treatment for malignant neoplasm: Secondary | ICD-10-CM | POA: Diagnosis not present

## 2021-11-27 DIAGNOSIS — Z85828 Personal history of other malignant neoplasm of skin: Secondary | ICD-10-CM | POA: Diagnosis not present

## 2021-12-18 DIAGNOSIS — E785 Hyperlipidemia, unspecified: Secondary | ICD-10-CM | POA: Diagnosis not present

## 2021-12-18 DIAGNOSIS — E559 Vitamin D deficiency, unspecified: Secondary | ICD-10-CM | POA: Diagnosis not present

## 2021-12-18 DIAGNOSIS — E1169 Type 2 diabetes mellitus with other specified complication: Secondary | ICD-10-CM | POA: Diagnosis not present

## 2021-12-19 ENCOUNTER — Encounter: Payer: Self-pay | Admitting: Family Medicine

## 2021-12-19 ENCOUNTER — Ambulatory Visit (INDEPENDENT_AMBULATORY_CARE_PROVIDER_SITE_OTHER): Payer: Medicare Other | Admitting: Family Medicine

## 2021-12-19 VITALS — BP 124/82 | HR 72 | Ht 66.0 in | Wt 179.0 lb

## 2021-12-19 DIAGNOSIS — Z0001 Encounter for general adult medical examination with abnormal findings: Secondary | ICD-10-CM | POA: Insufficient documentation

## 2021-12-19 DIAGNOSIS — Z1231 Encounter for screening mammogram for malignant neoplasm of breast: Secondary | ICD-10-CM

## 2021-12-19 DIAGNOSIS — E559 Vitamin D deficiency, unspecified: Secondary | ICD-10-CM

## 2021-12-19 DIAGNOSIS — E1169 Type 2 diabetes mellitus with other specified complication: Secondary | ICD-10-CM | POA: Diagnosis not present

## 2021-12-19 DIAGNOSIS — E785 Hyperlipidemia, unspecified: Secondary | ICD-10-CM | POA: Diagnosis not present

## 2021-12-19 DIAGNOSIS — Z1211 Encounter for screening for malignant neoplasm of colon: Secondary | ICD-10-CM

## 2021-12-19 LAB — CMP14+EGFR
ALT: 12 IU/L (ref 0–32)
AST: 11 IU/L (ref 0–40)
Albumin/Globulin Ratio: 1.9 (ref 1.2–2.2)
Albumin: 4.2 g/dL (ref 3.7–4.7)
Alkaline Phosphatase: 66 IU/L (ref 44–121)
BUN/Creatinine Ratio: 18 (ref 12–28)
BUN: 12 mg/dL (ref 8–27)
Bilirubin Total: 0.4 mg/dL (ref 0.0–1.2)
CO2: 23 mmol/L (ref 20–29)
Calcium: 9.4 mg/dL (ref 8.7–10.3)
Chloride: 103 mmol/L (ref 96–106)
Creatinine, Ser: 0.65 mg/dL (ref 0.57–1.00)
Globulin, Total: 2.2 g/dL (ref 1.5–4.5)
Glucose: 154 mg/dL — ABNORMAL HIGH (ref 70–99)
Potassium: 4.6 mmol/L (ref 3.5–5.2)
Sodium: 141 mmol/L (ref 134–144)
Total Protein: 6.4 g/dL (ref 6.0–8.5)
eGFR: 90 mL/min/{1.73_m2} (ref >59)

## 2021-12-19 LAB — LIPID PANEL
Chol/HDL Ratio: 5.1 ratio — ABNORMAL HIGH (ref 0.0–4.4)
Cholesterol, Total: 279 mg/dL — ABNORMAL HIGH (ref 100–199)
HDL: 55 mg/dL (ref >39)
LDL Chol Calc (NIH): 207 mg/dL — ABNORMAL HIGH (ref 0–99)
Triglycerides: 100 mg/dL (ref 0–149)
VLDL Cholesterol Cal: 17 mg/dL (ref 5–40)

## 2021-12-19 LAB — CBC
Hematocrit: 40.2 % (ref 34.0–46.6)
Hemoglobin: 13.2 g/dL (ref 11.1–15.9)
MCH: 31.1 pg (ref 26.6–33.0)
MCHC: 32.8 g/dL (ref 31.5–35.7)
MCV: 95 fL (ref 79–97)
Platelets: 217 10*3/uL (ref 150–450)
RBC: 4.24 x10E6/uL (ref 3.77–5.28)
RDW: 12.7 % (ref 11.7–15.4)
WBC: 5.9 10*3/uL (ref 3.4–10.8)

## 2021-12-19 LAB — HEMOGLOBIN A1C
Est. average glucose Bld gHb Est-mCnc: 166 mg/dL
Hgb A1c MFr Bld: 7.4 % — ABNORMAL HIGH (ref 4.8–5.6)

## 2021-12-19 LAB — VITAMIN D 25 HYDROXY (VIT D DEFICIENCY, FRACTURES): Vit D, 25-Hydroxy: 23.3 ng/mL — ABNORMAL LOW (ref 30.0–100.0)

## 2021-12-19 LAB — TSH: TSH: 3.35 u[IU]/mL (ref 0.450–4.500)

## 2021-12-19 MED ORDER — FENOFIBRATE 48 MG PO TABS: ORAL_TABLET | ORAL | 1 refills | Status: DC

## 2021-12-19 MED ORDER — DAPAGLIFLOZIN PROPANEDIOL 5 MG PO TABS: 5.0000 mg | ORAL_TABLET | Freq: Every day | ORAL | 5 refills | Status: DC

## 2021-12-19 NOTE — Patient Instructions (Addendum)
F/U in 14 weeks, call if you need me sooner ? ?Please get TdAP at your pharmacy ? ?Please schedule mammogram at checkout in [place] per pt request ? ?You are referred for colonoscopy to Dr Arnoldo Morale ? ?New for diabetes is farxiga one daily, and please reduce bread , and increase vegetables, blood sugar has increased and is too high ? ?Ensure you drink 64 ounces water daily ? ?Cholesterol improved, great, new addition is fenofibrate one every Monday, Wednesday and Friday ? ?When your colonoscopy is scheduled , discuss with Dr Arnoldo Morale neefd for anxiety medication. You will get medication at the time of the colonoscopy soI do not want to prescribe anything at this tme. ? ?Fasting lipid, cmp and EGFr , hBA1C 3 to 5 days before next scheduled appt ? ?Thanks for choosing [facility], we consider it a privelige to serve you. ? ?

## 2021-12-19 NOTE — Assessment & Plan Note (Signed)
Recommend 2000 IU daily, need to call and advise pt ?

## 2021-12-19 NOTE — Assessment & Plan Note (Signed)
Improved, add 3 times weekly fenofibrate ?Hyperlipidemia:Low fat diet discussed and encouraged. ? ? ?Lipid Panel  ?Lab Results  ?Component Value Date  ? CHOL 279 (H) 12/18/2021  ? HDL 55 12/18/2021  ? LDLCALC 207 (H) 12/18/2021  ? TRIG 100 12/18/2021  ? CHOLHDL 5.1 (H) 12/18/2021  ? ? ? ? ?

## 2021-12-19 NOTE — Assessment & Plan Note (Signed)

## 2021-12-19 NOTE — Progress Notes (Signed)
? ? ? ?Taylor Mitchell     MRN: [id number]      DOB: Nov 06, 1940 ? ?HPI: ?Patient is in for annual physical exam. ?Diabetes is addressed and medication started as well as counseling done re diet. Also hyperlipidemia addressed with additional med ?Recent labs,  are reviewed. ?Immunization nees addressed, refuses shingrix, will check on TdaP. ? ? ?PE: ?BP 124/82   Pulse 72   Ht '5\' 6"'$  (1.676 m)   Wt 179 lb (81.2 kg)   SpO2 92%   BMI 28.89 kg/m?  ? ?Pleasant  female, alert and oriented x 3, in no cardio-pulmonary distress. ?Afebrile. ?HEENT ?No facial trauma or asymetry. Sinuses non tender.  ?Extra occullar muscles intact.Marland Kitchen ?External ears normal, . ?Neck: decreased ROM, no adenopathy,JVD or thyromegaly.No bruits. ? ?Chest: ?Clear to ascultation bilaterally.No crackles or wheezes. ?Non tender to palpation ? ?Breast: ?Asymptomatic, not examoined, mammogram to be scheduled ? ?Cardiovascular system; ?Heart sounds normal,  S1 and  S2 ,no S3.  No murmur, or thrill. ?Apical beat not displaced ?Peripheral pulses normal. ? ?Abdomen: ?Soft, non tender, no organomegaly or masses. ?No bruits. ?Bowel sounds normal. ?No guarding, tenderness or rebound. ? ? ?Musculoskeletal exam: ?Full ROM of spine, hips , shoulders and knees. ?No deformity ,swelling or crepitus noted. ?No muscle wasting or atrophy.  ? ?Neurologic: ?Cranial nerves 2 to 12 intact. ?Power, tone ,sensation and reflexes normal throughout. ?No disturbance in gait. No tremor. ? ?Skin: ?Intact, no ulceration, erythema , scaling or rash noted. ?Pigmentation normal throughout ? ?Psych; ?Normal mood and affect. Judgement and concentration normal ? ? ?Assessment & Plan:  ?Type 2 diabetes mellitus with other specified complication ([place]) ?Taylor Mitchell is reminded of the importance of commitment to daily physical activity for 30 minutes or more, as able and the need to limit carbohydrate intake to 30 to 60 grams per meal to help with blood sugar control.  ? ?dEteriorated start daily  farxiga ? ?The need to take medication as prescribed, test blood sugar as directed, and to call between visits if there is a concern that blood sugar is uncontrolled is also discussed.  ? ?Taylor Mitchell is reminded of the importance of daily foot exam, annual eye examination, and good blood sugar, blood pressure and cholesterol control. ? ? ?  Latest Ref Rng & Units 12/18/2021  ?  8:12 AM 07/24/2021  ?  8:41 AM 04/18/2021  ?  8:23 AM 09/16/2020  ?  8:21 AM 06/02/2020  ?  8:47 AM  ?Diabetic Labs  ?HbA1c 4.8 - 5.6 % 7.4   6.9   7.2   6.5   6.4    ?Chol 100 - 199 mg/dL 279   316   332   333   314    ?HDL >39 mg/dL 55   48   48   53   47    ?Calc LDL 0 - 99 mg/dL 207   232   257   249   237    ?Triglycerides 0 - 149 mg/dL 100   183   141   163   144    ?Creatinine 0.57 - 1.00 mg/dL 0.65   0.83   0.82   0.78   0.70    ? ? ?  12/19/2021  ? 10:42 AM 12/19/2021  ? 10:07 AM 07/26/2021  ? 10:03 AM 07/26/2021  ?  9:26 AM 04/20/2021  ?  9:12 AM 02/22/2021  ?  8:08 AM 11/14/2020  ?  8:29 AM  ?  BP/Weight  ?Systolic BP 488 891 694 503 109  129  ?Diastolic BP 82 67 70 69 67  77  ?Wt. (Lbs)  179  177.08 179 180 180.12  ?BMI  28.89 kg/m2  28.58 kg/m2 28.89 kg/m2 29.05 kg/m2 29.07 kg/m2  ? ? ?  Latest Ref Rng & Units 03/23/2021  ? 12:00 AM 03/03/2020  ? 12:00 AM  ?Foot/eye exam completion dates  ?Eye Exam No Retinopathy No Retinopathy      No Retinopathy       ?  ? This result is from an external source.  ? ? ? ? ? ? ?Encounter for Medicare annual examination with abnormal findings ?Annual exam as documented. ?Counseling done  re healthy lifestyle involving commitment to 150 minutes exercise per week, heart healthy diet, and attaining healthy weight.The importance of adequate sleep also discussed. ?Regular seat belt use and home safety, is also discussed. ?Changes in health habits are decided on by the patient with goals and time frames  set for achieving them. ?Immunization and cancer screening needs are specifically addressed at this  visit. ? ? ?Hyperlipidemia associated with type 2 diabetes mellitus ([place]) ?Improved, add 3 times weekly fenofibrate ?Hyperlipidemia:Low fat diet discussed and encouraged. ? ? ?Lipid Panel  ?Lab Results  ?Component Value Date  ? CHOL 279 (H) 12/18/2021  ? HDL 55 12/18/2021  ? LDLCALC 207 (H) 12/18/2021  ? TRIG 100 12/18/2021  ? CHOLHDL 5.1 (H) 12/18/2021  ? ? ? ? ? ?

## 2021-12-19 NOTE — Assessment & Plan Note (Addendum)
Taylor Mitchell is reminded of the importance of commitment to daily physical activity for 30 minutes or more, as able and the need to limit carbohydrate intake to 30 to 60 grams per meal to help with blood sugar control.  ? ?dEteriorated start daily farxiga ? ?The need to take medication as prescribed, test blood sugar as directed, and to call between visits if there is a concern that blood sugar is uncontrolled is also discussed.  ? ?Taylor Mitchell is reminded of the importance of daily foot exam, annual eye examination, and good blood sugar, blood pressure and cholesterol control. ? ? ?  Latest Ref Rng & Units 12/18/2021  ?  8:12 AM 07/24/2021  ?  8:41 AM 04/18/2021  ?  8:23 AM 09/16/2020  ?  8:21 AM 06/02/2020  ?  8:47 AM  ?Diabetic Labs  ?HbA1c 4.8 - 5.6 % 7.4   6.9   7.2   6.5   6.4    ?Chol 100 - 199 mg/dL 279   316   332   333   314    ?HDL >39 mg/dL 55   48   48   53   47    ?Calc LDL 0 - 99 mg/dL 207   232   257   249   237    ?Triglycerides 0 - 149 mg/dL 100   183   141   163   144    ?Creatinine 0.57 - 1.00 mg/dL 0.65   0.83   0.82   0.78   0.70    ? ? ?  12/19/2021  ? 10:42 AM 12/19/2021  ? 10:07 AM 07/26/2021  ? 10:03 AM 07/26/2021  ?  9:26 AM 04/20/2021  ?  9:12 AM 02/22/2021  ?  8:08 AM 11/14/2020  ?  8:29 AM  ?BP/Weight  ?Systolic BP 732 202 542 706 109  129  ?Diastolic BP 82 67 70 69 67  77  ?Wt. (Lbs)  179  177.08 179 180 180.12  ?BMI  28.89 kg/m2  28.58 kg/m2 28.89 kg/m2 29.05 kg/m2 29.07 kg/m2  ? ? ?  Latest Ref Rng & Units 03/23/2021  ? 12:00 AM 03/03/2020  ? 12:00 AM  ?Foot/eye exam completion dates  ?Eye Exam No Retinopathy No Retinopathy      No Retinopathy       ?  ? This result is from an external source.  ? ? ? ? ? ?

## 2021-12-20 ENCOUNTER — Telehealth: Payer: Self-pay | Admitting: *Deleted

## 2021-12-20 NOTE — Telephone Encounter (Signed)
Received call from patient [phone]~ telephone.  ? ?Patient reports that she was advised by PCP that she is overdue for screening colonoscopy.  ? ?Reviewed colonoscopy report from 02/2016 with Dr. Arnoldo Morale where no further screenings required due to age.  ? ?PCP reports tubular adenoma noted on previous colonoscopy with repeat in (5) years for screening.  ? ?Prior colonoscopies in chart as follows: ?09/2007- Dr. Lucilla Lame: tubular adenoma in ascending colon, 2 hyperplastic polyps in sigmoid colon ?11/2010- Dr. Jill Side- diverticulosis in sigmoid colon, internal hemorrhoids ?02/2016- Dr. Aviva Signs- diverticulosis in descending colon ? ?Patient contacted insurance and was advised that a Diagnostic Colonoscopy would be covered, but she would have out of pocket costs for a Screening Colonoscopy due to age. Patient requested appointment to discuss with Dr. Arnoldo Morale. Appointment scheduled.  ?

## 2021-12-29 DIAGNOSIS — M9903 Segmental and somatic dysfunction of lumbar region: Secondary | ICD-10-CM | POA: Diagnosis not present

## 2021-12-29 DIAGNOSIS — M9905 Segmental and somatic dysfunction of pelvic region: Secondary | ICD-10-CM | POA: Diagnosis not present

## 2021-12-29 DIAGNOSIS — M62830 Muscle spasm of back: Secondary | ICD-10-CM | POA: Diagnosis not present

## 2021-12-29 DIAGNOSIS — M546 Pain in thoracic spine: Secondary | ICD-10-CM | POA: Diagnosis not present

## 2021-12-29 DIAGNOSIS — M9902 Segmental and somatic dysfunction of thoracic region: Secondary | ICD-10-CM | POA: Diagnosis not present

## 2022-01-07 IMAGING — US US BREAST*L* LIMITED INC AXILLA
1 series · 5 of 5 positions shown · non-contrast
Comparison: PREVIOUS EXAMS.

CLINICAL DATA: Follow-up for probably benign left breast mass.

EXAM:
DIGITAL DIAGNOSTIC BILATERAL MAMMOGRAM WITH TOMO AND CAD; ULTRASOUND
LEFT BREAST LIMITED
LEFT BREAST ULTRASOUND

[Series 1: us breast*left* limited inc axilla · 0.06mm/px · 5 of 5 slices shown]
[im 1/5]
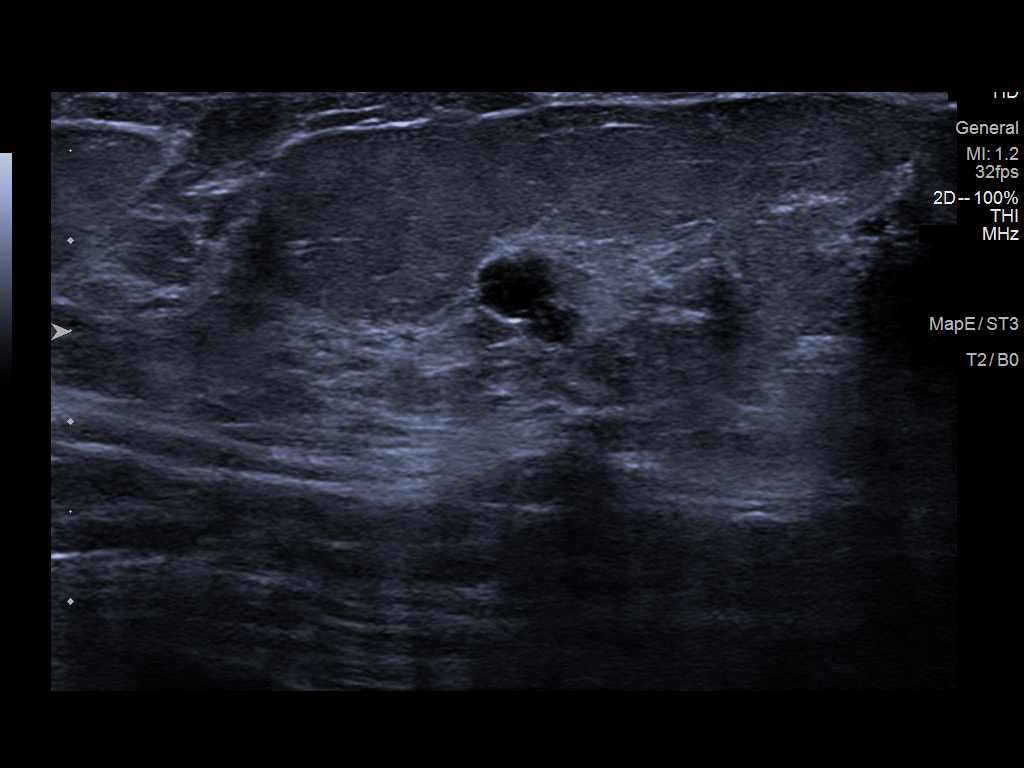
[im 2/5]
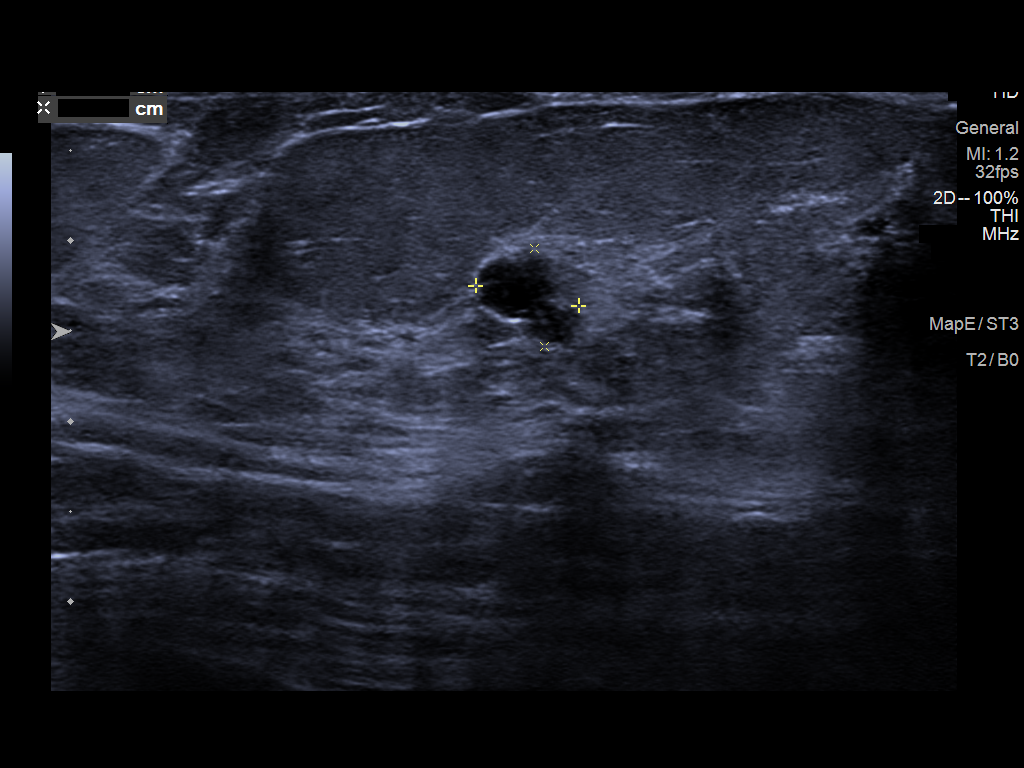
[im 3/5]
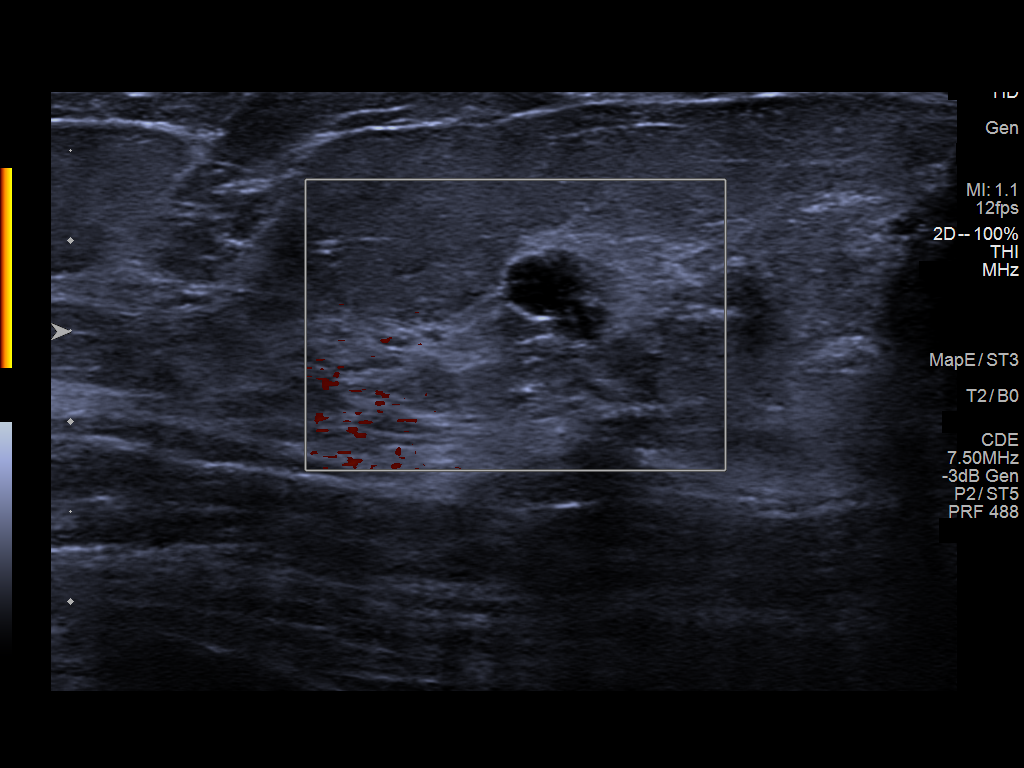
[im 4/5]
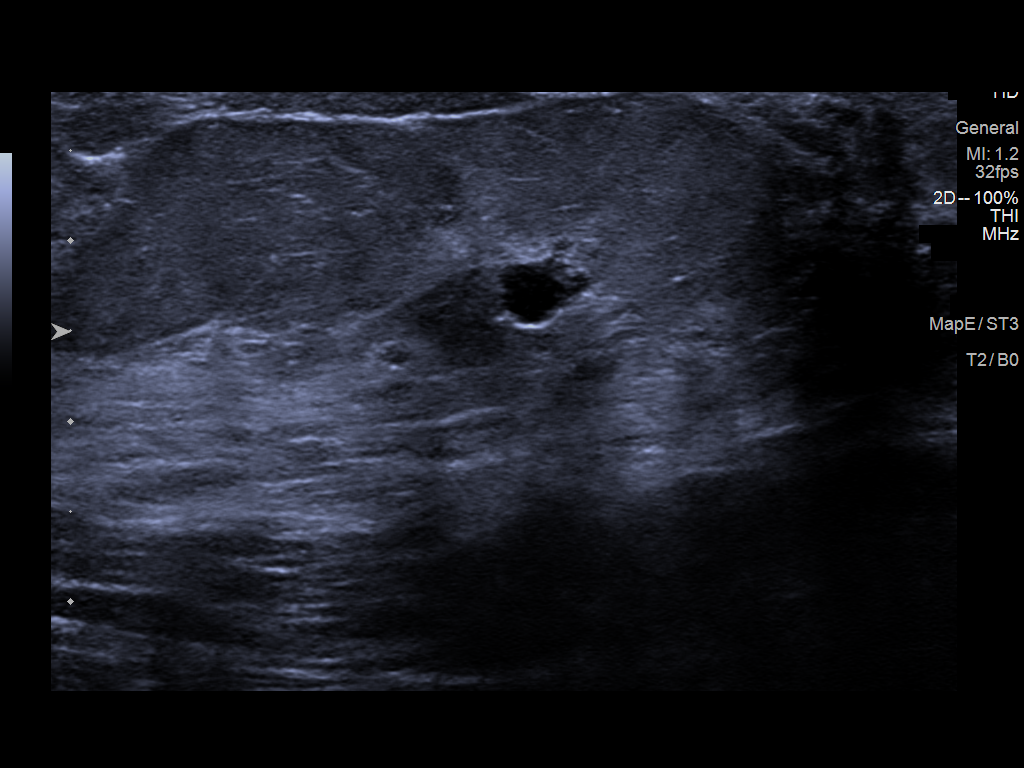
[im 5/5]
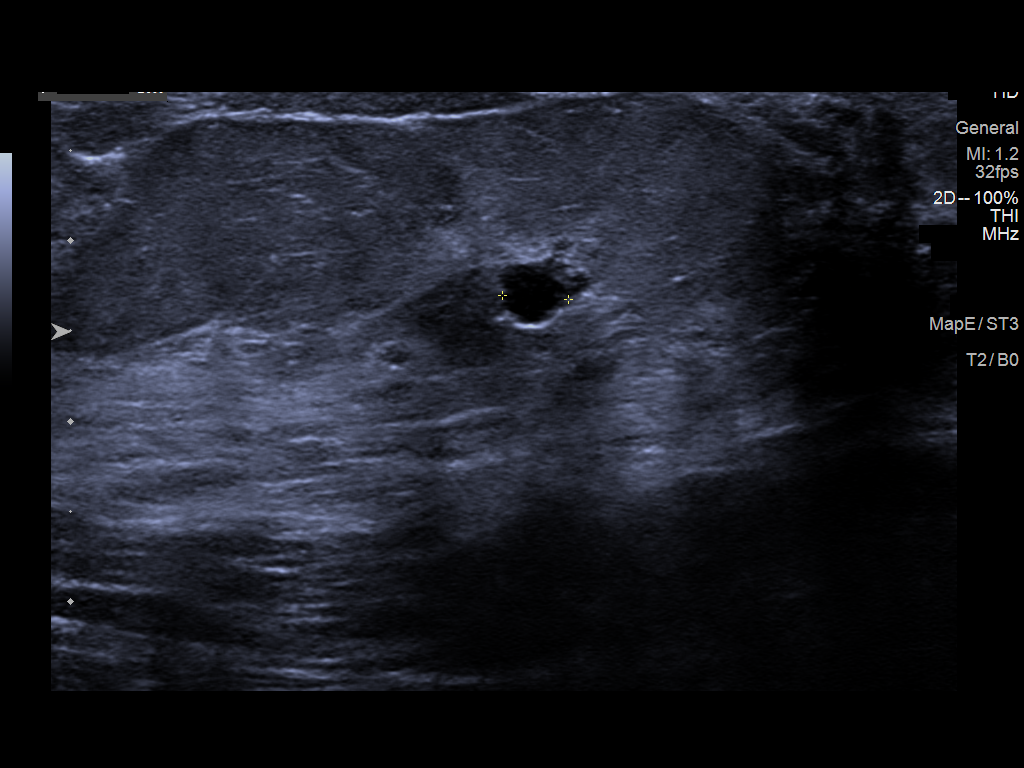

[5 of 5 positions shown; findings below may reference images not displayed]

ACR Breast Density Category c: The breast tissue is heterogeneously
dense, which may obscure small masses.
FINDINGS: No suspicious masses or calcifications are seen in either breast.
Stable appearance of oval circumscribed mass in the retroareolar
left breast. There is no mammographic evidence of malignancy in
either breast.

Mammographic images were processed with CAD.

Targeted ultrasound of the retroareolar left breast was performed.
The cluster of cysts in the left breast at 11 o'clock retroareolar
measures 0.6 x 0.6 x 0.4 cm, overall stable to slightly smaller in
size when compared to 3796 and considered benign given greater than
2 years of stability.
IMPRESSION: No findings of malignancy in either breast.

RECOMMENDATION:
Screening mammogram in one year.(Code:GV-[id number])

I have discussed the findings and recommendations with the patient.
If applicable, a reminder letter will be sent to the patient
regarding the next appointment.

BI-RADS CATEGORY  2: Benign.

## 2022-01-11 ENCOUNTER — Ambulatory Visit: Payer: Medicare Other | Admitting: General Surgery

## 2022-02-16 ENCOUNTER — Ambulatory Visit
Admission: RE | Admit: 2022-02-16 | Discharge: 2022-02-16 | Disposition: A | Discharge status: Home / Self Care | Payer: Medicare Other | Source: Ambulatory Visit | Attending: Family Medicine | Admitting: Family Medicine

## 2022-02-16 DIAGNOSIS — Z1231 Encounter for screening mammogram for malignant neoplasm of breast: Secondary | ICD-10-CM | POA: Insufficient documentation

## 2022-03-26 DIAGNOSIS — Z961 Presence of intraocular lens: Secondary | ICD-10-CM | POA: Diagnosis not present

## 2022-03-26 DIAGNOSIS — H524 Presbyopia: Secondary | ICD-10-CM | POA: Diagnosis not present

## 2022-03-26 DIAGNOSIS — E119 Type 2 diabetes mellitus without complications: Secondary | ICD-10-CM | POA: Diagnosis not present

## 2022-03-26 DIAGNOSIS — H52203 Unspecified astigmatism, bilateral: Secondary | ICD-10-CM | POA: Diagnosis not present

## 2022-03-27 ENCOUNTER — Ambulatory Visit: Payer: Medicare Other | Admitting: Family Medicine

## 2022-03-28 DIAGNOSIS — L820 Inflamed seborrheic keratosis: Secondary | ICD-10-CM | POA: Diagnosis not present

## 2022-03-28 DIAGNOSIS — D0339 Melanoma in situ of other parts of face: Secondary | ICD-10-CM | POA: Diagnosis not present

## 2022-03-28 DIAGNOSIS — Z1283 Encounter for screening for malignant neoplasm of skin: Secondary | ICD-10-CM | POA: Diagnosis not present

## 2022-03-28 DIAGNOSIS — B078 Other viral warts: Secondary | ICD-10-CM | POA: Diagnosis not present

## 2022-03-28 DIAGNOSIS — Z08 Encounter for follow-up examination after completed treatment for malignant neoplasm: Secondary | ICD-10-CM | POA: Diagnosis not present

## 2022-03-28 DIAGNOSIS — Z85828 Personal history of other malignant neoplasm of skin: Secondary | ICD-10-CM | POA: Diagnosis not present

## 2022-03-28 DIAGNOSIS — L570 Actinic keratosis: Secondary | ICD-10-CM | POA: Diagnosis not present

## 2022-03-28 DIAGNOSIS — X32XXXD Exposure to sunlight, subsequent encounter: Secondary | ICD-10-CM | POA: Diagnosis not present

## 2022-04-12 DIAGNOSIS — D0339 Melanoma in situ of other parts of face: Secondary | ICD-10-CM | POA: Diagnosis not present

## 2022-04-12 DIAGNOSIS — L988 Other specified disorders of the skin and subcutaneous tissue: Secondary | ICD-10-CM | POA: Diagnosis not present

## 2022-04-25 DIAGNOSIS — M9903 Segmental and somatic dysfunction of lumbar region: Secondary | ICD-10-CM | POA: Diagnosis not present

## 2022-04-25 DIAGNOSIS — M546 Pain in thoracic spine: Secondary | ICD-10-CM | POA: Diagnosis not present

## 2022-04-25 DIAGNOSIS — M9902 Segmental and somatic dysfunction of thoracic region: Secondary | ICD-10-CM | POA: Diagnosis not present

## 2022-04-25 DIAGNOSIS — M62830 Muscle spasm of back: Secondary | ICD-10-CM | POA: Diagnosis not present

## 2022-04-25 DIAGNOSIS — M9905 Segmental and somatic dysfunction of pelvic region: Secondary | ICD-10-CM | POA: Diagnosis not present

## 2022-04-27 NOTE — Progress Notes (Signed)
[facility] Quality Team Note  Name: Taylor Mitchell Date of Birth: May 23, 1941 MRN: [id number] Date: 04/27/2022  [facility] Quality Team has reviewed this patient's chart, please see recommendations below:  [facility] Quality Other; (KED: Kidney Health Evaluation Gap- Patient needs Urine Albumin Creatinine Ratio Test completed for gap closure. EGFR has already been completed, Patient has upcoming appointment with [facility], 05/30/2022. )

## 2022-05-24 DIAGNOSIS — E1169 Type 2 diabetes mellitus with other specified complication: Secondary | ICD-10-CM | POA: Diagnosis not present

## 2022-05-25 LAB — CMP14+EGFR
ALT: 10 IU/L (ref 0–32)
AST: 12 IU/L (ref 0–40)
Albumin/Globulin Ratio: 1.7 (ref 1.2–2.2)
Albumin: 4.4 g/dL (ref 3.8–4.8)
Alkaline Phosphatase: 63 IU/L (ref 44–121)
BUN/Creatinine Ratio: 20 (ref 12–28)
BUN: 14 mg/dL (ref 8–27)
Bilirubin Total: 0.5 mg/dL (ref 0.0–1.2)
CO2: 24 mmol/L (ref 20–29)
Calcium: 9.4 mg/dL (ref 8.7–10.3)
Chloride: 103 mmol/L (ref 96–106)
Creatinine, Ser: 0.7 mg/dL (ref 0.57–1.00)
Globulin, Total: 2.6 g/dL (ref 1.5–4.5)
Glucose: 148 mg/dL — ABNORMAL HIGH (ref 70–99)
Potassium: 4.3 mmol/L (ref 3.5–5.2)
Sodium: 141 mmol/L (ref 134–144)
Total Protein: 7 g/dL (ref 6.0–8.5)
eGFR: 88 mL/min/{1.73_m2} (ref >59)

## 2022-05-25 LAB — HEMOGLOBIN A1C
Est. average glucose Bld gHb Est-mCnc: 160 mg/dL
Hgb A1c MFr Bld: 7.2 % — ABNORMAL HIGH (ref 4.8–5.6)

## 2022-05-25 LAB — LIPID PANEL
Chol/HDL Ratio: 6.5 ratio — ABNORMAL HIGH (ref 0.0–4.4)
Cholesterol, Total: 319 mg/dL — ABNORMAL HIGH (ref 100–199)
HDL: 49 mg/dL (ref >39)
LDL Chol Calc (NIH): 239 mg/dL — ABNORMAL HIGH (ref 0–99)
Triglycerides: 160 mg/dL — ABNORMAL HIGH (ref 0–149)
VLDL Cholesterol Cal: 31 mg/dL (ref 5–40)

## 2022-05-30 ENCOUNTER — Encounter: Payer: Self-pay | Admitting: Family Medicine

## 2022-05-30 ENCOUNTER — Ambulatory Visit (INDEPENDENT_AMBULATORY_CARE_PROVIDER_SITE_OTHER): Payer: Medicare Other | Admitting: Family Medicine

## 2022-05-30 VITALS — BP 123/72 | HR 88 | Ht 66.0 in | Wt 174.0 lb

## 2022-05-30 DIAGNOSIS — E1169 Type 2 diabetes mellitus with other specified complication: Secondary | ICD-10-CM

## 2022-05-30 DIAGNOSIS — E785 Hyperlipidemia, unspecified: Secondary | ICD-10-CM | POA: Diagnosis not present

## 2022-05-30 NOTE — Patient Instructions (Addendum)
Follow-up in 4 months call if you need me sooner.  Blood sugar has improved which is excellent.  Foot exam today shows poor sensation so it is important that you examine your feet daily.  Cholesterol has increased.  Please reduce cheese egg yolks or oils and butter.  Please take fenofibrate 48 mg 1 tablet every Monday Wednesday and Friday if able.  Microalbumin today.  Fasting lipid CMP and EGFR and HbA1c 3 days before next visit.  Flu vaccine today if she agrees please  Thanks for choosing [facility], we consider it a privelige to serve you.

## 2022-05-31 ENCOUNTER — Telehealth: Payer: Self-pay

## 2022-05-31 NOTE — Telephone Encounter (Signed)
Patient aware.

## 2022-05-31 NOTE — Telephone Encounter (Signed)
Patient called said she did not get her 90 day supply on cholesterol fenofibrate (TRICOR) 48 MG tablet , only received 1 month. To let Dr Moshe Cipro know.

## 2022-05-31 NOTE — Telephone Encounter (Signed)
LMTRC

## 2022-05-31 NOTE — Telephone Encounter (Signed)
Patient returning call [phone]

## 2022-06-01 LAB — MICROALBUMIN / CREATININE URINE RATIO
Creatinine, Urine: 89 mg/dL
Microalb/Creat Ratio: 7 mg/g creat (ref 0–29)
Microalbumin, Urine: 6.4 ug/mL

## 2022-06-03 ENCOUNTER — Encounter: Payer: Self-pay | Admitting: Family Medicine

## 2022-06-03 NOTE — Assessment & Plan Note (Addendum)
Ms. Fredericksen is reminded of the importance of commitment to daily physical activity for 30 minutes or more, as able and the need to limit carbohydrate intake to 30 to 60 grams per meal to help with blood sugar control.      Ms. Oloughlin is reminded of the importance of daily foot exam, annual eye examination, and good blood sugar, blood pressure and cholesterol control.     Latest Ref Rng & Units 05/30/2022   10:15 AM 05/24/2022    8:36 AM 12/18/2021    8:12 AM 07/24/2021    8:41 AM 04/18/2021    8:23 AM  Diabetic Labs  HbA1c 4.8 - 5.6 %  7.2  7.4  6.9  7.2   Micro/Creat Ratio 0 - 29 mg/g creat 7       Chol 100 - 199 mg/dL  319  279  316  332   HDL >39 mg/dL  49  55  48  48   Calc LDL 0 - 99 mg/dL  239  207  232  257   Triglycerides 0 - 149 mg/dL  160  100  183  141   Creatinine 0.57 - 1.00 mg/dL  0.70  0.65  0.83  0.82       05/30/2022    9:26 AM 12/19/2021   10:42 AM 12/19/2021   10:07 AM 07/26/2021   10:03 AM 07/26/2021    9:26 AM 04/20/2021    9:12 AM 02/22/2021    8:08 AM  BP/Weight  Systolic BP 825 053 976 734 193 790   Diastolic BP 72 82 67 70 69 67   Wt. (Lbs) 174.04  179  177.08 179 180  BMI 28.09 kg/m2  28.89 kg/m2  28.58 kg/m2 28.89 kg/m2 29.05 kg/m2      Latest Ref Rng & Units 05/30/2022    9:20 AM 03/23/2021   12:00 AM  Foot/eye exam completion dates  Eye Exam No Retinopathy  No Retinopathy      Foot Form Completion  Done      This result is from an external source.

## 2022-06-03 NOTE — Progress Notes (Signed)
Taylor Mitchell     MRN: [id number]      DOB: 01/06/1941   HPI Taylor Mitchell is here for follow up and re-evaluation of chronic medical conditions, medication management and review of any available recent lab and radiology data.  Preventive health is updated, specifically  Cancer screening and Immunization.   The PT denies any adverse reactions to current medications since the last visit.  There are no new concerns.  There are no specific complaints   ROS Denies recent fever or chills. Denies sinus pressure, nasal congestion, ear pain or sore throat. Denies chest congestion, productive cough or wheezing. Denies chest pains, palpitations and leg swelling Denies abdominal pain, nausea, vomiting,diarrhea or constipation.   Denies dysuria, frequency, hesitancy or incontinence. Denies joint pain, swelling and limitation in mobility. Denies headaches, seizures, numbness, or tingling. Denies depression, anxiety or insomnia. Denies skin break down or rash.   PE  BP 123/72 (BP Location: Right Arm, Patient Position: Sitting, Cuff Size: Large)   Pulse 88   Ht '5\' 6"'$  (1.676 m)   Wt 174 lb 0.6 oz (78.9 kg)   SpO2 93%   BMI 28.09 kg/m   Patient alert and oriented and in no cardiopulmonary distress.  HEENT: No facial asymmetry, EOMI,     Neck supple .  Chest: Clear to auscultation bilaterally.  CVS: S1, S2 no murmurs, no S3.Regular rate.  ABD: Soft non tender.   Ext: No edema  MS: Adequate ROM spine, shoulders, hips and knees.  Skin: Intact, no ulcerations or rash noted.  Psych: Good eye contact, normal affect. Memory intact not anxious or depressed appearing.  CNS: CN 2-12 intact, power,  normal throughout.no focal deficits noted.   Assessment & Plan  Hyperlipidemia associated with type 2 diabetes mellitus ([place]) Hyperlipidemia:Low fat diet discussed and encouraged.   Lipid Panel  Lab Results  Component Value Date   CHOL 319 (H) 05/24/2022   HDL 49 05/24/2022   LDLCALC  239 (H) 05/24/2022   TRIG 160 (H) 05/24/2022   CHOLHDL 6.5 (H) 05/24/2022     Deteriorated, at very high CV risk, reports intolerance of all meds, will work on diet and try low dose fenofibrate 3 times weekly  Type 2 diabetes mellitus with other specified complication ([place]) Taylor Mitchell is reminded of the importance of commitment to daily physical activity for 30 minutes or more, as able and the need to limit carbohydrate intake to 30 to 60 grams per meal to help with blood sugar control.      Taylor Mitchell is reminded of the importance of daily foot exam, annual eye examination, and good blood sugar, blood pressure and cholesterol control.     Latest Ref Rng & Units 05/30/2022   10:15 AM 05/24/2022    8:36 AM 12/18/2021    8:12 AM 07/24/2021    8:41 AM 04/18/2021    8:23 AM  Diabetic Labs  HbA1c 4.8 - 5.6 %  7.2  7.4  6.9  7.2   Micro/Creat Ratio 0 - 29 mg/g creat 7       Chol 100 - 199 mg/dL  319  279  316  332   HDL >39 mg/dL  49  55  48  48   Calc LDL 0 - 99 mg/dL  239  207  232  257   Triglycerides 0 - 149 mg/dL  160  100  183  141   Creatinine 0.57 - 1.00 mg/dL  0.70  0.65  0.83  0.82       05/30/2022    9:26 AM 12/19/2021   10:42 AM 12/19/2021   10:07 AM 07/26/2021   10:03 AM 07/26/2021    9:26 AM 04/20/2021    9:12 AM 02/22/2021    8:08 AM  BP/Weight  Systolic BP 112 162 446 950 722 575   Diastolic BP 72 82 67 70 69 67   Wt. (Lbs) 174.04  179  177.08 179 180  BMI 28.09 kg/m2  28.89 kg/m2  28.58 kg/m2 28.89 kg/m2 29.05 kg/m2      Latest Ref Rng & Units 05/30/2022    9:20 AM 03/23/2021   12:00 AM  Foot/eye exam completion dates  Eye Exam No Retinopathy  No Retinopathy      Foot Form Completion  Done      This result is from an external source.

## 2022-06-03 NOTE — Assessment & Plan Note (Signed)
Hyperlipidemia:Low fat diet discussed and encouraged.   Lipid Panel  Lab Results  Component Value Date   CHOL 319 (H) 05/24/2022   HDL 49 05/24/2022   LDLCALC 239 (H) 05/24/2022   TRIG 160 (H) 05/24/2022   CHOLHDL 6.5 (H) 05/24/2022     Deteriorated, at very high CV risk, reports intolerance of all meds, will work on diet and try low dose fenofibrate 3 times weekly

## 2022-07-16 DIAGNOSIS — M546 Pain in thoracic spine: Secondary | ICD-10-CM | POA: Diagnosis not present

## 2022-07-16 DIAGNOSIS — M62830 Muscle spasm of back: Secondary | ICD-10-CM | POA: Diagnosis not present

## 2022-07-16 DIAGNOSIS — M9902 Segmental and somatic dysfunction of thoracic region: Secondary | ICD-10-CM | POA: Diagnosis not present

## 2022-07-16 DIAGNOSIS — M9905 Segmental and somatic dysfunction of pelvic region: Secondary | ICD-10-CM | POA: Diagnosis not present

## 2022-07-16 DIAGNOSIS — M9903 Segmental and somatic dysfunction of lumbar region: Secondary | ICD-10-CM | POA: Diagnosis not present

## 2022-07-17 DIAGNOSIS — R0981 Nasal congestion: Secondary | ICD-10-CM | POA: Diagnosis not present

## 2022-07-17 DIAGNOSIS — H60543 Acute eczematoid otitis externa, bilateral: Secondary | ICD-10-CM | POA: Diagnosis not present

## 2022-07-27 DIAGNOSIS — M9902 Segmental and somatic dysfunction of thoracic region: Secondary | ICD-10-CM | POA: Diagnosis not present

## 2022-07-27 DIAGNOSIS — M546 Pain in thoracic spine: Secondary | ICD-10-CM | POA: Diagnosis not present

## 2022-07-27 DIAGNOSIS — M9905 Segmental and somatic dysfunction of pelvic region: Secondary | ICD-10-CM | POA: Diagnosis not present

## 2022-07-27 DIAGNOSIS — M62830 Muscle spasm of back: Secondary | ICD-10-CM | POA: Diagnosis not present

## 2022-07-27 DIAGNOSIS — M9903 Segmental and somatic dysfunction of lumbar region: Secondary | ICD-10-CM | POA: Diagnosis not present

## 2022-07-30 DIAGNOSIS — Z08 Encounter for follow-up examination after completed treatment for malignant neoplasm: Secondary | ICD-10-CM | POA: Diagnosis not present

## 2022-07-30 DIAGNOSIS — Z85820 Personal history of malignant melanoma of skin: Secondary | ICD-10-CM | POA: Diagnosis not present

## 2022-07-30 DIAGNOSIS — D225 Melanocytic nevi of trunk: Secondary | ICD-10-CM | POA: Diagnosis not present

## 2022-07-30 DIAGNOSIS — L820 Inflamed seborrheic keratosis: Secondary | ICD-10-CM | POA: Diagnosis not present

## 2022-07-30 DIAGNOSIS — Z1283 Encounter for screening for malignant neoplasm of skin: Secondary | ICD-10-CM | POA: Diagnosis not present

## 2022-10-01 DIAGNOSIS — M9902 Segmental and somatic dysfunction of thoracic region: Secondary | ICD-10-CM | POA: Diagnosis not present

## 2022-10-01 DIAGNOSIS — M546 Pain in thoracic spine: Secondary | ICD-10-CM | POA: Diagnosis not present

## 2022-10-01 DIAGNOSIS — M62830 Muscle spasm of back: Secondary | ICD-10-CM | POA: Diagnosis not present

## 2022-10-01 DIAGNOSIS — M9905 Segmental and somatic dysfunction of pelvic region: Secondary | ICD-10-CM | POA: Diagnosis not present

## 2022-10-01 DIAGNOSIS — M9903 Segmental and somatic dysfunction of lumbar region: Secondary | ICD-10-CM | POA: Diagnosis not present

## 2022-10-02 ENCOUNTER — Ambulatory Visit: Payer: Medicare Other | Admitting: Family Medicine

## 2022-10-26 DIAGNOSIS — M9905 Segmental and somatic dysfunction of pelvic region: Secondary | ICD-10-CM | POA: Diagnosis not present

## 2022-10-26 DIAGNOSIS — M546 Pain in thoracic spine: Secondary | ICD-10-CM | POA: Diagnosis not present

## 2022-10-26 DIAGNOSIS — M9903 Segmental and somatic dysfunction of lumbar region: Secondary | ICD-10-CM | POA: Diagnosis not present

## 2022-10-26 DIAGNOSIS — M62830 Muscle spasm of back: Secondary | ICD-10-CM | POA: Diagnosis not present

## 2022-10-26 DIAGNOSIS — M9902 Segmental and somatic dysfunction of thoracic region: Secondary | ICD-10-CM | POA: Diagnosis not present

## 2022-12-10 DIAGNOSIS — M9905 Segmental and somatic dysfunction of pelvic region: Secondary | ICD-10-CM | POA: Diagnosis not present

## 2022-12-10 DIAGNOSIS — M546 Pain in thoracic spine: Secondary | ICD-10-CM | POA: Diagnosis not present

## 2022-12-10 DIAGNOSIS — M9903 Segmental and somatic dysfunction of lumbar region: Secondary | ICD-10-CM | POA: Diagnosis not present

## 2022-12-10 DIAGNOSIS — M9902 Segmental and somatic dysfunction of thoracic region: Secondary | ICD-10-CM | POA: Diagnosis not present

## 2022-12-10 DIAGNOSIS — M62830 Muscle spasm of back: Secondary | ICD-10-CM | POA: Diagnosis not present

## 2022-12-31 DIAGNOSIS — B351 Tinea unguium: Secondary | ICD-10-CM | POA: Diagnosis not present

## 2022-12-31 DIAGNOSIS — Z85820 Personal history of malignant melanoma of skin: Secondary | ICD-10-CM | POA: Diagnosis not present

## 2022-12-31 DIAGNOSIS — L820 Inflamed seborrheic keratosis: Secondary | ICD-10-CM | POA: Diagnosis not present

## 2022-12-31 DIAGNOSIS — Z08 Encounter for follow-up examination after completed treatment for malignant neoplasm: Secondary | ICD-10-CM | POA: Diagnosis not present

## 2022-12-31 DIAGNOSIS — Z1283 Encounter for screening for malignant neoplasm of skin: Secondary | ICD-10-CM | POA: Diagnosis not present

## 2022-12-31 DIAGNOSIS — D225 Melanocytic nevi of trunk: Secondary | ICD-10-CM | POA: Diagnosis not present

## 2023-01-15 ENCOUNTER — Other Ambulatory Visit: Payer: Self-pay | Admitting: Family Medicine

## 2023-01-15 DIAGNOSIS — Z1231 Encounter for screening mammogram for malignant neoplasm of breast: Secondary | ICD-10-CM

## 2023-02-18 ENCOUNTER — Ambulatory Visit
Admission: RE | Admit: 2023-02-18 | Discharge: 2023-02-18 | Disposition: A | Discharge status: Home / Self Care | Payer: Medicare Other | Source: Ambulatory Visit | Attending: Family Medicine | Admitting: Family Medicine

## 2023-02-18 DIAGNOSIS — Z1231 Encounter for screening mammogram for malignant neoplasm of breast: Secondary | ICD-10-CM | POA: Diagnosis not present

## 2023-02-19 ENCOUNTER — Other Ambulatory Visit: Payer: Self-pay | Admitting: Family Medicine

## 2023-02-19 DIAGNOSIS — R921 Mammographic calcification found on diagnostic imaging of breast: Secondary | ICD-10-CM

## 2023-02-19 DIAGNOSIS — R928 Other abnormal and inconclusive findings on diagnostic imaging of breast: Secondary | ICD-10-CM

## 2023-02-21 ENCOUNTER — Ambulatory Visit
Admission: RE | Admit: 2023-02-21 | Discharge: 2023-02-21 | Disposition: A | Discharge status: Home / Self Care | Payer: Medicare Other | Source: Ambulatory Visit | Attending: Family Medicine | Admitting: Family Medicine

## 2023-02-21 DIAGNOSIS — R921 Mammographic calcification found on diagnostic imaging of breast: Secondary | ICD-10-CM | POA: Diagnosis not present

## 2023-02-21 DIAGNOSIS — R928 Other abnormal and inconclusive findings on diagnostic imaging of breast: Secondary | ICD-10-CM | POA: Insufficient documentation

## 2023-02-21 DIAGNOSIS — R92333 Mammographic heterogeneous density, bilateral breasts: Secondary | ICD-10-CM | POA: Diagnosis not present

## 2023-03-06 ENCOUNTER — Other Ambulatory Visit: Payer: Self-pay

## 2023-03-06 DIAGNOSIS — E1169 Type 2 diabetes mellitus with other specified complication: Secondary | ICD-10-CM

## 2023-03-06 DIAGNOSIS — M62830 Muscle spasm of back: Secondary | ICD-10-CM | POA: Diagnosis not present

## 2023-03-06 DIAGNOSIS — M9903 Segmental and somatic dysfunction of lumbar region: Secondary | ICD-10-CM | POA: Diagnosis not present

## 2023-03-06 DIAGNOSIS — M9905 Segmental and somatic dysfunction of pelvic region: Secondary | ICD-10-CM | POA: Diagnosis not present

## 2023-03-06 DIAGNOSIS — E559 Vitamin D deficiency, unspecified: Secondary | ICD-10-CM

## 2023-03-06 DIAGNOSIS — E663 Overweight: Secondary | ICD-10-CM

## 2023-03-06 DIAGNOSIS — M9902 Segmental and somatic dysfunction of thoracic region: Secondary | ICD-10-CM | POA: Diagnosis not present

## 2023-03-06 DIAGNOSIS — M546 Pain in thoracic spine: Secondary | ICD-10-CM | POA: Diagnosis not present

## 2023-03-25 DIAGNOSIS — D225 Melanocytic nevi of trunk: Secondary | ICD-10-CM | POA: Diagnosis not present

## 2023-03-25 DIAGNOSIS — Z08 Encounter for follow-up examination after completed treatment for malignant neoplasm: Secondary | ICD-10-CM | POA: Diagnosis not present

## 2023-03-25 DIAGNOSIS — Z1283 Encounter for screening for malignant neoplasm of skin: Secondary | ICD-10-CM | POA: Diagnosis not present

## 2023-03-25 DIAGNOSIS — Z85820 Personal history of malignant melanoma of skin: Secondary | ICD-10-CM | POA: Diagnosis not present

## 2023-03-25 DIAGNOSIS — L570 Actinic keratosis: Secondary | ICD-10-CM | POA: Diagnosis not present

## 2023-03-25 DIAGNOSIS — X32XXXD Exposure to sunlight, subsequent encounter: Secondary | ICD-10-CM | POA: Diagnosis not present

## 2023-04-04 DIAGNOSIS — E785 Hyperlipidemia, unspecified: Secondary | ICD-10-CM | POA: Diagnosis not present

## 2023-04-04 DIAGNOSIS — E559 Vitamin D deficiency, unspecified: Secondary | ICD-10-CM | POA: Diagnosis not present

## 2023-04-04 DIAGNOSIS — E1169 Type 2 diabetes mellitus with other specified complication: Secondary | ICD-10-CM | POA: Diagnosis not present

## 2023-04-05 LAB — CBC: WBC: 7.3 10*3/uL (ref 3.4–10.8)

## 2023-04-05 LAB — LIPID PANEL: LDL Chol Calc (NIH): 217 mg/dL — ABNORMAL HIGH (ref 0–99)

## 2023-04-06 LAB — CBC: MCV: 95 fL (ref 79–97)

## 2023-04-06 LAB — LIPID PANEL: Triglycerides: 109 mg/dL (ref 0–149)

## 2023-04-06 LAB — CMP14+EGFR
ALT: 17 IU/L (ref 0–32)
Chloride: 103 mmol/L (ref 96–106)
Globulin, Total: 2.4 g/dL (ref 1.5–4.5)

## 2023-04-09 ENCOUNTER — Ambulatory Visit (INDEPENDENT_AMBULATORY_CARE_PROVIDER_SITE_OTHER): Payer: Medicare Other | Admitting: Family Medicine

## 2023-04-09 VITALS — BP 128/78 | HR 67 | Ht 66.0 in | Wt 176.0 lb

## 2023-04-09 DIAGNOSIS — E1169 Type 2 diabetes mellitus with other specified complication: Secondary | ICD-10-CM

## 2023-04-09 DIAGNOSIS — E785 Hyperlipidemia, unspecified: Secondary | ICD-10-CM | POA: Diagnosis not present

## 2023-04-09 DIAGNOSIS — Z2821 Immunization not carried out because of patient refusal: Secondary | ICD-10-CM | POA: Diagnosis not present

## 2023-04-09 DIAGNOSIS — Z1231 Encounter for screening mammogram for malignant neoplasm of breast: Secondary | ICD-10-CM | POA: Diagnosis not present

## 2023-04-09 MED ORDER — VITAMIN D (ERGOCALCIFEROL) 1.25 MG (50000 UNIT) PO CAPS: 50000.0000 [IU] | ORAL_CAPSULE | ORAL | 6 refills | Status: DC

## 2023-04-09 MED ORDER — DAPAGLIFLOZIN PROPANEDIOL 5 MG PO TABS: 5.0000 mg | ORAL_TABLET | Freq: Every day | ORAL | 5 refills | Status: DC

## 2023-04-09 NOTE — Patient Instructions (Addendum)
   Pls schedule right diagnostic mammogram due Aug 24, 2023, [place]    Pls schedule AWV at checkout  Annual exam with MD early December  Non fasting HBA1C, chem 7 and EGFR  and urine ACR, Dec 2 please or soon after  Goal for fasting blood sugar ranges from 90 to 130 , please test and record every day, Nurse to provide log sheet  New for diabetes is farxiga 5 mg one daily  Please drink 64 ounces water daily  Cut back on butter, margerine, cheese, egg youls and fried foods  Thanks for choosing [facility], we consider it a privelige to serve you.

## 2023-04-16 ENCOUNTER — Encounter: Payer: Self-pay | Admitting: Family Medicine

## 2023-04-16 DIAGNOSIS — Z2821 Immunization not carried out because of patient refusal: Secondary | ICD-10-CM | POA: Insufficient documentation

## 2023-04-16 NOTE — Progress Notes (Signed)
Taylor Mitchell     MRN: [id number]      DOB: Jun 11, 1941  Chief Complaint  Patient presents with   Diabetes    Diabetes follow up. Having numbness and tingling in feet. Having trouble with balance.    Lab results    Pt. Would like to go over lab results.   Immunizations    Pt. Has declined care gap immunizations.     HPI [name] is here for follow up and re-evaluation of chronic medical conditions, medication management and review of any available recent lab and radiology data.  Preventive health is updated, specifically  Cancer screening and Immunization.   Questions or concerns regarding consultations or procedures which the PT has had in the interim are  addressed. The PT denies any adverse reactions to current medications since the last visit.  Concerns as above   ROS Denies recent fever or chills. Denies sinus pressure, nasal congestion, ear pain or sore throat. Denies chest congestion, productive cough or wheezing. Denies chest pains, palpitations and leg swelling Denies abdominal pain, nausea, vomiting,diarrhea or constipation.   Denies dysuria, frequency, hesitancy or incontinence. Denies joint pain, swelling and limitation in mobility. Denies headaches, seizures, numbness, or tingling. Denies depression, anxiety or insomnia. Denies skin break down or rash.   PE  BP 128/78 (BP Location: Left Arm, Patient Position: Sitting, Cuff Size: Normal)   Pulse 67   Ht 5\' 6"  (1.676 m)   Wt 176 lb (79.8 kg)   SpO2 92%   BMI 28.41 kg/m   Patient alert and oriented and in no cardiopulmonary distress.  HEENT: No facial asymmetry, EOMI,     Neck supple .  Chest: Clear to auscultation bilaterally.  CVS: S1, S2 no murmurs, no S3.Regular rate.  ABD: Soft non tender.   Ext: No edema  MS: Adequate ROM spine, shoulders, hips and knees.  Skin: Intact, no ulcerations or rash noted.  Psych: Good eye contact, normal affect. Memory intact not anxious or depressed  appearing.  CNS: CN 2-12 intact, power,  normal throughout.no focal deficits noted.   Assessment & Plan  Type 2 diabetes mellitus with other specified complication (HCC) Uncontrolled needs to star medication, wants to take farxiga as family member has good result with this Taylor Mitchell is reminded of the importance of commitment to daily physical activity for 30 minutes or more, as able and the need to limit carbohydrate intake to 30 to 60 grams per meal to help with blood sugar control.   The need to take medication as prescribed, test blood sugar as directed, and to call between visits if there is a concern that blood sugar is uncontrolled is also discussed.   Taylor Mitchell is reminded of the importance of daily foot exam, annual eye examination, and good blood sugar, blood pressure and cholesterol control. Recommend utrtion counselling / diabetic ed      Latest Ref Rng & Units 04/04/2023    8:00 AM 05/30/2022   10:15 AM 05/24/2022    8:36 AM 12/18/2021    8:12 AM 07/24/2021    8:41 AM  Diabetic Labs  HbA1c 4.8 - 5.6 % 7.9   7.2  7.4  6.9   Micro/Creat Ratio 0 - 29 mg/g creat  7      Chol 100 - 199 mg/dL 409   811  914  782   HDL >39 mg/dL 62   49  55  48   Calc LDL 0 - 99 mg/dL 956  239  207  232   Triglycerides 0 - 149 mg/dL 161   096  045  409   Creatinine 0.57 - 1.00 mg/dL 8.11   9.14  7.82  9.56       04/09/2023    2:22 PM 05/30/2022    9:26 AM 12/19/2021   10:42 AM 12/19/2021   10:07 AM 07/26/2021   10:03 AM 07/26/2021    9:26 AM 04/20/2021    9:12 AM  BP/Weight  Systolic BP 128 123 124 101 120 144 109  Diastolic BP 78 72 82 67 70 69 67  Wt. (Lbs) 176 174.04  179  177.08 179  BMI 28.41 kg/m2 28.09 kg/m2  28.89 kg/m2  28.58 kg/m2 28.89 kg/m2      Latest Ref Rng & Units 05/30/2022    9:20 AM 03/23/2021   12:00 AM  Foot/eye exam completion dates  Eye Exam No Retinopathy  No Retinopathy      Foot Form Completion  Done      This result is from an external source.         Hyperlipidemia associated with type 2 diabetes mellitus (HCC) Hyperlipidemia:Low fat diet discussed and encouraged.   Lipid Panel  Lab Results  Component Value Date   CHOL 298 (H) 04/04/2023   HDL 62 04/04/2023   LDLCALC 217 (H) 04/04/2023   TRIG 109 04/04/2023   CHOLHDL 4.8 (H) 04/04/2023     Improved slightly, intolerant of all meds tried  Immunization refused Discussed with pt need for immunization, currently declines all offered / indicated

## 2023-04-16 NOTE — Assessment & Plan Note (Signed)
Hyperlipidemia:Low fat diet discussed and encouraged.   Lipid Panel  Lab Results  Component Value Date   CHOL 298 (H) 04/04/2023   HDL 62 04/04/2023   LDLCALC 217 (H) 04/04/2023   TRIG 109 04/04/2023   CHOLHDL 4.8 (H) 04/04/2023     Improved slightly, intolerant of all meds tried

## 2023-04-16 NOTE — Assessment & Plan Note (Signed)
Uncontrolled needs to star medication, wants to take farxiga as family member has good result with this Taylor Mitchell is reminded of the importance of commitment to daily physical activity for 30 minutes or more, as able and the need to limit carbohydrate intake to 30 to 60 grams per meal to help with blood sugar control.   The need to take medication as prescribed, test blood sugar as directed, and to call between visits if there is a concern that blood sugar is uncontrolled is also discussed.   Taylor Mitchell is reminded of the importance of daily foot exam, annual eye examination, and good blood sugar, blood pressure and cholesterol control. Recommend utrtion counselling / diabetic ed      Latest Ref Rng & Units 04/04/2023    8:00 AM 05/30/2022   10:15 AM 05/24/2022    8:36 AM 12/18/2021    8:12 AM 07/24/2021    8:41 AM  Diabetic Labs  HbA1c 4.8 - 5.6 % 7.9   7.2  7.4  6.9   Micro/Creat Ratio 0 - 29 mg/g creat  7      Chol 100 - 199 mg/dL 161   096  045  409   HDL >39 mg/dL 62   49  55  48   Calc LDL 0 - 99 mg/dL 811   914  782  956   Triglycerides 0 - 149 mg/dL 213   086  578  469   Creatinine 0.57 - 1.00 mg/dL 6.29   5.28  4.13  2.44       04/09/2023    2:22 PM 05/30/2022    9:26 AM 12/19/2021   10:42 AM 12/19/2021   10:07 AM 07/26/2021   10:03 AM 07/26/2021    9:26 AM 04/20/2021    9:12 AM  BP/Weight  Systolic BP 128 123 124 101 120 144 109  Diastolic BP 78 72 82 67 70 69 67  Wt. (Lbs) 176 174.04  179  177.08 179  BMI 28.41 kg/m2 28.09 kg/m2  28.89 kg/m2  28.58 kg/m2 28.89 kg/m2      Latest Ref Rng & Units 05/30/2022    9:20 AM 03/23/2021   12:00 AM  Foot/eye exam completion dates  Eye Exam No Retinopathy  No Retinopathy      Foot Form Completion  Done      This result is from an external source.

## 2023-04-16 NOTE — Assessment & Plan Note (Signed)
Discussed with pt need for immunization, currently declines all offered / indicated

## 2023-04-22 ENCOUNTER — Telehealth: Payer: Self-pay | Admitting: Family Medicine

## 2023-04-22 DIAGNOSIS — E113393 Type 2 diabetes mellitus with moderate nonproliferative diabetic retinopathy without macular edema, bilateral: Secondary | ICD-10-CM | POA: Diagnosis not present

## 2023-04-22 NOTE — Telephone Encounter (Signed)
She needs to take diabetic med, we established that and she requested farxiga Tends t have a lot of intolerance to meds and the farxiga has benefits for her  Pls document what her s/e are why she stopped Document blood sugar readings  I plan to encourage her to take the farxiga

## 2023-04-22 NOTE — Telephone Encounter (Signed)
pls

## 2023-04-22 NOTE — Telephone Encounter (Signed)
Patient called in regard to dapagliflozin propanediol (FARXIGA) 5 MG TABS tablet [[id number]]    Patient states that she has skipped a dose of medication. Had some side effects .  Wants a  call back in regard.

## 2023-04-23 NOTE — Telephone Encounter (Signed)
Patient states she started hurting in her back and is concerned it could be affecting her kidneys she has since stopped the medication and has an appointment with a chiropractor to see if that helps

## 2023-04-24 DIAGNOSIS — M9902 Segmental and somatic dysfunction of thoracic region: Secondary | ICD-10-CM | POA: Diagnosis not present

## 2023-04-24 DIAGNOSIS — M62830 Muscle spasm of back: Secondary | ICD-10-CM | POA: Diagnosis not present

## 2023-04-24 DIAGNOSIS — M9903 Segmental and somatic dysfunction of lumbar region: Secondary | ICD-10-CM | POA: Diagnosis not present

## 2023-04-24 DIAGNOSIS — M546 Pain in thoracic spine: Secondary | ICD-10-CM | POA: Diagnosis not present

## 2023-04-24 DIAGNOSIS — M9905 Segmental and somatic dysfunction of pelvic region: Secondary | ICD-10-CM | POA: Diagnosis not present

## 2023-04-24 NOTE — Telephone Encounter (Signed)
Lmtrc-kg

## 2023-05-09 DIAGNOSIS — X32XXXD Exposure to sunlight, subsequent encounter: Secondary | ICD-10-CM | POA: Diagnosis not present

## 2023-05-09 DIAGNOSIS — L570 Actinic keratosis: Secondary | ICD-10-CM | POA: Diagnosis not present

## 2023-05-09 DIAGNOSIS — C44311 Basal cell carcinoma of skin of nose: Secondary | ICD-10-CM | POA: Diagnosis not present

## 2023-05-27 DIAGNOSIS — H26492 Other secondary cataract, left eye: Secondary | ICD-10-CM | POA: Diagnosis not present

## 2023-05-27 DIAGNOSIS — E119 Type 2 diabetes mellitus without complications: Secondary | ICD-10-CM | POA: Diagnosis not present

## 2023-05-27 DIAGNOSIS — H26493 Other secondary cataract, bilateral: Secondary | ICD-10-CM | POA: Diagnosis not present

## 2023-06-10 DIAGNOSIS — H26491 Other secondary cataract, right eye: Secondary | ICD-10-CM | POA: Diagnosis not present

## 2023-07-01 ENCOUNTER — Telehealth: Payer: Self-pay

## 2023-07-01 DIAGNOSIS — M9905 Segmental and somatic dysfunction of pelvic region: Secondary | ICD-10-CM | POA: Diagnosis not present

## 2023-07-01 DIAGNOSIS — M62830 Muscle spasm of back: Secondary | ICD-10-CM | POA: Diagnosis not present

## 2023-07-01 DIAGNOSIS — M546 Pain in thoracic spine: Secondary | ICD-10-CM | POA: Diagnosis not present

## 2023-07-01 DIAGNOSIS — M9902 Segmental and somatic dysfunction of thoracic region: Secondary | ICD-10-CM | POA: Diagnosis not present

## 2023-07-01 DIAGNOSIS — M9903 Segmental and somatic dysfunction of lumbar region: Secondary | ICD-10-CM | POA: Diagnosis not present

## 2023-07-01 NOTE — Telephone Encounter (Signed)
error 

## 2023-07-05 ENCOUNTER — Telehealth: Payer: Self-pay | Admitting: Family Medicine

## 2023-07-05 NOTE — Telephone Encounter (Signed)
Patient called asking can Dr Lodema Hong send in a nerve pill to help her due to her husband Rosanne Ashing.  Pharmacy: [facility]  Call patient back to [phone]

## 2023-07-08 DIAGNOSIS — M62830 Muscle spasm of back: Secondary | ICD-10-CM | POA: Diagnosis not present

## 2023-07-08 DIAGNOSIS — M9903 Segmental and somatic dysfunction of lumbar region: Secondary | ICD-10-CM | POA: Diagnosis not present

## 2023-07-08 DIAGNOSIS — M9902 Segmental and somatic dysfunction of thoracic region: Secondary | ICD-10-CM | POA: Diagnosis not present

## 2023-07-08 DIAGNOSIS — M9905 Segmental and somatic dysfunction of pelvic region: Secondary | ICD-10-CM | POA: Diagnosis not present

## 2023-07-08 DIAGNOSIS — M546 Pain in thoracic spine: Secondary | ICD-10-CM | POA: Diagnosis not present

## 2023-07-09 MED ORDER — BUSPIRONE HCL 5 MG PO TABS: ORAL_TABLET | ORAL | 1 refills | Status: DC

## 2023-07-09 NOTE — Telephone Encounter (Signed)
Husband is now under hospice care she states he swelling and oozing everywhere just stressful. Just wanting something for stressful moments.

## 2023-07-15 DIAGNOSIS — E1169 Type 2 diabetes mellitus with other specified complication: Secondary | ICD-10-CM | POA: Diagnosis not present

## 2023-07-15 DIAGNOSIS — E785 Hyperlipidemia, unspecified: Secondary | ICD-10-CM | POA: Diagnosis not present

## 2023-07-16 ENCOUNTER — Ambulatory Visit: Payer: Medicare Other | Admitting: Family Medicine

## 2023-07-17 ENCOUNTER — Encounter: Payer: Self-pay | Admitting: Family Medicine

## 2023-07-17 ENCOUNTER — Ambulatory Visit: Payer: Medicare Other | Admitting: Family Medicine

## 2023-07-17 VITALS — BP 127/75 | HR 66 | Ht 66.0 in | Wt 169.0 lb

## 2023-07-17 DIAGNOSIS — Z0001 Encounter for general adult medical examination with abnormal findings: Secondary | ICD-10-CM | POA: Diagnosis not present

## 2023-07-17 DIAGNOSIS — E663 Overweight: Secondary | ICD-10-CM

## 2023-07-17 DIAGNOSIS — Z0000 Encounter for general adult medical examination without abnormal findings: Secondary | ICD-10-CM

## 2023-07-17 DIAGNOSIS — E785 Hyperlipidemia, unspecified: Secondary | ICD-10-CM | POA: Diagnosis not present

## 2023-07-17 DIAGNOSIS — Z1231 Encounter for screening mammogram for malignant neoplasm of breast: Secondary | ICD-10-CM

## 2023-07-17 DIAGNOSIS — E559 Vitamin D deficiency, unspecified: Secondary | ICD-10-CM | POA: Diagnosis not present

## 2023-07-17 DIAGNOSIS — E1169 Type 2 diabetes mellitus with other specified complication: Secondary | ICD-10-CM

## 2023-07-17 DIAGNOSIS — Z1211 Encounter for screening for malignant neoplasm of colon: Secondary | ICD-10-CM

## 2023-07-17 LAB — BMP8+EGFR
BUN/Creatinine Ratio: 20 (ref 12–28)
BUN: 14 mg/dL (ref 8–27)
CO2: 21 mmol/L (ref 20–29)
Calcium: 9.6 mg/dL (ref 8.7–10.3)
Chloride: 103 mmol/L (ref 96–106)
Creatinine, Ser: 0.7 mg/dL (ref 0.57–1.00)
Glucose: 147 mg/dL — ABNORMAL HIGH (ref 70–99)
Potassium: 3.8 mmol/L (ref 3.5–5.2)
Sodium: 141 mmol/L (ref 134–144)
eGFR: 87 mL/min/{1.73_m2} (ref >59)

## 2023-07-17 LAB — MICROALBUMIN / CREATININE URINE RATIO
Creatinine, Urine: 60.4 mg/dL
Microalb/Creat Ratio: 7 mg/g{creat} (ref 0–29)
Microalbumin, Urine: 4.3 ug/mL

## 2023-07-17 LAB — HEMOGLOBIN A1C
Est. average glucose Bld gHb Est-mCnc: 160 mg/dL
Hgb A1c MFr Bld: 7.2 % — ABNORMAL HIGH (ref 4.8–5.6)

## 2023-07-17 NOTE — Patient Instructions (Addendum)
F/U in 14 to 16 weeks, call if you need me sooner  Please schedule wellness at checkout  Please schedule diagnostic mammogram for January at [facility] in [place] at [employer]  Fasting lipid, cmp and EGFr and hBA1C  3 to 5 days before and  vit D level  Nurse please add lipid panel and hepatic panel to labs just drawnm, wewill contact you with result  Nurse please order cologuard and review with pt  Congratulations on much improved blood sugar, excellent kidney function and normal blood pressure  Please commit to vit D3, 2000 international units  every day  Best to you and family, esp your hubby  Thanks for choosing [facility], we consider it a privelige to serve you.

## 2023-07-17 NOTE — Progress Notes (Unsigned)
    Taylor Mitchell     MRN: [id number]      DOB: 13-May-1941  Chief Complaint  Patient presents with   Follow-up    Follow up    HPI: Patient is in for annual physical exam.  Recent labs,  are reviewed. Immunization is reviewed , and  declines vaccines, despite re education  PE: BP 127/75 (BP Location: Right Arm, Patient Position: Sitting, Cuff Size: Large)   Pulse 66   Ht 5\' 6"  (1.676 m)   Wt 169 lb 0.6 oz (76.7 kg)   SpO2 96%   BMI 27.28 kg/m   Pleasant  female, alert and oriented x 3, in no cardio-pulmonary distress. Afebrile. HEENT No facial trauma or asymetry. Sinuses non tender.  Extra occullar muscles intact.. External ears normal, . Neck: supple, no adenopathy,JVD or thyromegaly.No bruits.  Chest: Clear to ascultation bilaterally.No crackles or wheezes. Non tender to palpation  Cardiovascular system; Heart sounds normal,  S1 and  S2 ,no S3.  No murmur, or thrill. Apical beat not displaced Peripheral pulses normal.  Abdomen: Soft, non tender   Musculoskeletal exam: Full ROM of spine, hips , shoulders and knees. No deformity ,swelling or crepitus noted. No muscle wasting or atrophy.   Neurologic: Cranial nerves 2 to 12 intact. Power, tone ,sensation and reflexes normal throughout. No disturbance in gait. No tremor.  Skin: Intact, no ulceration, erythema , scaling or rash noted. Pigmentation normal throughout  Psych; Normal mood and affect. Judgement and concentration normal   Assessment & Plan:  No problem-specific Assessment & Plan notes found for this encounter.

## 2023-07-18 LAB — HEPATIC FUNCTION PANEL
ALT: 18 [IU]/L (ref 0–32)
AST: 18 [IU]/L (ref 0–40)
Albumin: 4.4 g/dL (ref 3.8–4.8)
Alkaline Phosphatase: 63 [IU]/L (ref 44–121)
Bilirubin Total: 0.5 mg/dL (ref 0.0–1.2)
Bilirubin, Direct: 0.13 mg/dL (ref 0.00–0.40)
Total Protein: 6.8 g/dL (ref 6.0–8.5)

## 2023-07-18 LAB — LIPID PANEL
Chol/HDL Ratio: 5 {ratio} — ABNORMAL HIGH (ref 0.0–4.4)
Cholesterol, Total: 305 mg/dL — ABNORMAL HIGH (ref 100–199)
HDL: 61 mg/dL (ref >39)
LDL Chol Calc (NIH): 221 mg/dL — ABNORMAL HIGH (ref 0–99)
Triglycerides: 127 mg/dL (ref 0–149)
VLDL Cholesterol Cal: 23 mg/dL (ref 5–40)

## 2023-07-18 LAB — SPECIMEN STATUS REPORT

## 2023-07-19 ENCOUNTER — Other Ambulatory Visit: Payer: Self-pay | Admitting: Family Medicine

## 2023-08-08 NOTE — Assessment & Plan Note (Signed)
Annual exam as documented. . Immunization and cancer screening needs are specifically addressed at this visit.  

## 2023-08-09 DIAGNOSIS — Z1211 Encounter for screening for malignant neoplasm of colon: Secondary | ICD-10-CM | POA: Diagnosis not present

## 2023-08-17 LAB — COLOGUARD: COLOGUARD: NEGATIVE

## 2023-09-04 ENCOUNTER — Ambulatory Visit
Admission: RE | Admit: 2023-09-04 | Discharge: 2023-09-04 | Disposition: A | Discharge status: Home / Self Care | Payer: Medicare Other | Source: Ambulatory Visit | Attending: Family Medicine | Admitting: Family Medicine

## 2023-09-04 DIAGNOSIS — Z1239 Encounter for other screening for malignant neoplasm of breast: Secondary | ICD-10-CM | POA: Diagnosis not present

## 2023-09-04 DIAGNOSIS — R921 Mammographic calcification found on diagnostic imaging of breast: Secondary | ICD-10-CM | POA: Insufficient documentation

## 2023-09-04 DIAGNOSIS — R92333 Mammographic heterogeneous density, bilateral breasts: Secondary | ICD-10-CM | POA: Insufficient documentation

## 2023-09-04 DIAGNOSIS — Z1231 Encounter for screening mammogram for malignant neoplasm of breast: Secondary | ICD-10-CM | POA: Diagnosis present

## 2023-09-06 ENCOUNTER — Telehealth: Payer: Self-pay

## 2023-09-06 NOTE — Telephone Encounter (Signed)
Copied from CRM [id number]. Topic: Clinical - Lab/Test Results >> Sep 06, 2023 10:05 AM Shelah Lewandowsky wrote: Reason for CRM: Please call patient regarding stool sample results  [phone] or [phone]

## 2023-09-09 DIAGNOSIS — M9903 Segmental and somatic dysfunction of lumbar region: Secondary | ICD-10-CM | POA: Diagnosis not present

## 2023-09-09 DIAGNOSIS — M62830 Muscle spasm of back: Secondary | ICD-10-CM | POA: Diagnosis not present

## 2023-09-09 DIAGNOSIS — M9902 Segmental and somatic dysfunction of thoracic region: Secondary | ICD-10-CM | POA: Diagnosis not present

## 2023-09-09 DIAGNOSIS — M9905 Segmental and somatic dysfunction of pelvic region: Secondary | ICD-10-CM | POA: Diagnosis not present

## 2023-09-09 DIAGNOSIS — M546 Pain in thoracic spine: Secondary | ICD-10-CM | POA: Diagnosis not present

## 2023-09-09 NOTE — Telephone Encounter (Signed)
Left detailed vm letting her know results are neg.

## 2023-10-14 DIAGNOSIS — E785 Hyperlipidemia, unspecified: Secondary | ICD-10-CM | POA: Diagnosis not present

## 2023-10-14 DIAGNOSIS — E559 Vitamin D deficiency, unspecified: Secondary | ICD-10-CM | POA: Diagnosis not present

## 2023-10-14 DIAGNOSIS — E1169 Type 2 diabetes mellitus with other specified complication: Secondary | ICD-10-CM | POA: Diagnosis not present

## 2023-10-15 ENCOUNTER — Other Ambulatory Visit: Payer: Self-pay | Admitting: Family Medicine

## 2023-10-15 LAB — CMP14+EGFR
ALT: 15 IU/L (ref 0–32)
AST: 14 IU/L (ref 0–40)
Albumin: 4.4 g/dL (ref 3.7–4.7)
Alkaline Phosphatase: 70 IU/L (ref 44–121)
BUN/Creatinine Ratio: 27 (ref 12–28)
BUN: 20 mg/dL (ref 8–27)
Bilirubin Total: 0.6 mg/dL (ref 0.0–1.2)
CO2: 23 mmol/L (ref 20–29)
Calcium: 9.6 mg/dL (ref 8.7–10.3)
Chloride: 105 mmol/L (ref 96–106)
Creatinine, Ser: 0.75 mg/dL (ref 0.57–1.00)
Globulin, Total: 2.5 g/dL (ref 1.5–4.5)
Glucose: 155 mg/dL — ABNORMAL HIGH (ref 70–99)
Potassium: 4.3 mmol/L (ref 3.5–5.2)
Sodium: 143 mmol/L (ref 134–144)
Total Protein: 6.9 g/dL (ref 6.0–8.5)
eGFR: 80 mL/min/{1.73_m2} (ref >59)

## 2023-10-15 LAB — LIPID PANEL
Chol/HDL Ratio: 5 ratio — ABNORMAL HIGH (ref 0.0–4.4)
Cholesterol, Total: 303 mg/dL — ABNORMAL HIGH (ref 100–199)
HDL: 61 mg/dL (ref >39)
LDL Chol Calc (NIH): 222 mg/dL — ABNORMAL HIGH (ref 0–99)
Triglycerides: 116 mg/dL (ref 0–149)
VLDL Cholesterol Cal: 20 mg/dL (ref 5–40)

## 2023-10-15 LAB — VITAMIN D 25 HYDROXY (VIT D DEFICIENCY, FRACTURES): Vit D, 25-Hydroxy: 22.2 ng/mL — ABNORMAL LOW (ref 30.0–100.0)

## 2023-10-15 LAB — HEMOGLOBIN A1C
Est. average glucose Bld gHb Est-mCnc: 163 mg/dL
Hgb A1c MFr Bld: 7.3 % — ABNORMAL HIGH (ref 4.8–5.6)

## 2023-10-17 ENCOUNTER — Encounter: Payer: Self-pay | Admitting: Family Medicine

## 2023-10-17 ENCOUNTER — Ambulatory Visit (INDEPENDENT_AMBULATORY_CARE_PROVIDER_SITE_OTHER): Payer: Self-pay | Admitting: Family Medicine

## 2023-10-17 VITALS — BP 115/73 | HR 59 | Resp 16 | Ht 66.0 in | Wt 166.0 lb

## 2023-10-17 DIAGNOSIS — R7989 Other specified abnormal findings of blood chemistry: Secondary | ICD-10-CM

## 2023-10-17 DIAGNOSIS — E1169 Type 2 diabetes mellitus with other specified complication: Secondary | ICD-10-CM | POA: Diagnosis not present

## 2023-10-17 DIAGNOSIS — E663 Overweight: Secondary | ICD-10-CM | POA: Diagnosis not present

## 2023-10-17 DIAGNOSIS — Z0001 Encounter for general adult medical examination with abnormal findings: Secondary | ICD-10-CM | POA: Diagnosis not present

## 2023-10-17 NOTE — Patient Instructions (Addendum)
 F/U in 4 months, call if you need me sooner  Blood pressure, kidney and liver function are excellent  CBC, B12 level, HBA1C, chem 7 and EGFR and TSH  3 to 5 days before next  visit  Please reduce fried and fatty foods  Tumeric recommended  Vit D take two tablets every day , still low   OK to take vit B12 one tablet 3 days per week ( nurse please order)  Thanks for choosing [facility], we consider it a privelige to serve you.

## 2023-10-18 DIAGNOSIS — M9902 Segmental and somatic dysfunction of thoracic region: Secondary | ICD-10-CM | POA: Diagnosis not present

## 2023-10-18 DIAGNOSIS — M62830 Muscle spasm of back: Secondary | ICD-10-CM | POA: Diagnosis not present

## 2023-10-18 DIAGNOSIS — M9905 Segmental and somatic dysfunction of pelvic region: Secondary | ICD-10-CM | POA: Diagnosis not present

## 2023-10-18 DIAGNOSIS — M546 Pain in thoracic spine: Secondary | ICD-10-CM | POA: Diagnosis not present

## 2023-10-18 DIAGNOSIS — M9903 Segmental and somatic dysfunction of lumbar region: Secondary | ICD-10-CM | POA: Diagnosis not present

## 2023-10-21 ENCOUNTER — Encounter: Payer: Self-pay | Admitting: Family Medicine

## 2023-10-21 DIAGNOSIS — R7989 Other specified abnormal findings of blood chemistry: Secondary | ICD-10-CM | POA: Insufficient documentation

## 2023-10-21 NOTE — Assessment & Plan Note (Signed)

## 2023-10-21 NOTE — Assessment & Plan Note (Signed)
 Updated lab needed at/ before next visit.

## 2023-10-21 NOTE — Assessment & Plan Note (Signed)
 Diabetes associated with hyperlipidemia  Taylor Mitchell is reminded of the importance of commitment to daily physical activity for 30 minutes or more, as able and the need to limit carbohydrate intake to 30 to 60 grams per meal to help with blood sugar control.   The need to take medication as prescribed, test blood sugar as directed, and to call between visits if there is a concern that blood sugar is uncontrolled is also discussed.   Taylor Mitchell is reminded of the importance of daily foot exam, annual eye examination, and good blood sugar, blood pressure and cholesterol control.     Latest Ref Rng & Units 10/14/2023    8:24 AM 07/15/2023    8:20 AM 04/04/2023    8:00 AM 05/30/2022   10:15 AM 05/24/2022    8:36 AM  Diabetic Labs  HbA1c 4.8 - 5.6 % 7.3  7.2  7.9   7.2   Micro/Creat Ratio 0 - 29 mg/g creat  7   7    Chol 100 - 199 mg/dL 981  191  478   295   HDL >39 mg/dL 61  61  62   49   Calc LDL 0 - 99 mg/dL 621  308  657   846   Triglycerides 0 - 149 mg/dL 962  952  841   324   Creatinine 0.57 - 1.00 mg/dL 4.01  0.27  2.53   6.64       10/17/2023    3:32 PM 07/17/2023   10:11 AM 04/09/2023    2:22 PM 05/30/2022    9:26 AM 12/19/2021   10:42 AM 12/19/2021   10:07 AM 07/26/2021   10:03 AM  BP/Weight  Systolic BP 115 127 128 123 124 101 120  Diastolic BP 73 75 78 72 82 67 70  Wt. (Lbs) 166 169.04 176 174.04  179   BMI 26.79 kg/m2 27.28 kg/m2 28.41 kg/m2 28.09 kg/m2  28.89 kg/m2       Latest Ref Rng & Units 05/30/2022    9:20 AM 03/23/2021   12:00 AM  Foot/eye exam completion dates  Eye Exam No Retinopathy  No Retinopathy      Foot Form Completion  Done      This result is from an external source.

## 2023-10-21 NOTE — Progress Notes (Signed)
 Sadeel Fiddler Iyengar     MRN: [id number]      DOB: 1940/12/02  Chief Complaint  Patient presents with   Annual Exam    HPI: Patient is in for annual physical exam. No other health concerns are expressed or addressed at the visit.Doing well emotionally and mentally following recent loss of spouse Recent labs,  are reviewed.Lipids have worsened, intolerant of all meds attempted  Immunization is reviewed , and  updated if needed.   PE: BP 115/73   Pulse (!) 59   Resp 16   Ht 5\' 6"  (1.676 m)   Wt 166 lb (75.3 kg)   SpO2 95%   BMI 26.79 kg/m   Pleasant  female, alert and oriented x 3, in no cardio-pulmonary distress. Afebrile. HEENT No facial trauma or asymetry. Sinuses non tender.  Extra occullar muscles intact.. External ears normal, . Neck: supple, no adenopathy,JVD or thyromegaly.No bruits.  Chest: Clear to ascultation bilaterally.No crackles or wheezes. Non tender to palpation  Breast: Mammogram uTD and no concerns, not  examined   Cardiovascular system; Heart sounds normal,  S1 and  S2 ,no S3.  No murmur, or thrill. Apical beat not displaced Peripheral pulses normal.  Abdomen: Soft, non tender, no organomegaly or masses.   Musculoskeletal exam: Full ROM of spine, hips , shoulders and knees. No deformity ,swelling or crepitus noted. No muscle wasting or atrophy.   Neurologic: Cranial nerves 2 to 12 intact. Power, tone ,sensation and reflexes normal throughout. No disturbance in gait. No tremor.  Skin: Intact, no ulceration, erythema , scaling or rash noted. Pigmentation normal throughout  Psych; Normal mood and affect. Judgement and concentration normal       A/P  Encounter for Medicare annual examination with abnormal findings Annual exam as documented. Counseling done  re healthy lifestyle involving commitment to 150 minutes exercise per week, heart healthy diet, and attaining healthy weight.The importance of adequate sleep also  discussed. Regular seat belt use and home safety, is also discussed. Changes in health habits are decided on by the patient with goals and time frames  set for achieving them. Immunization and cancer screening needs are specifically addressed at this visit.   Low vitamin B12 level Updated lab needed at/ before next visit.   Overweight (BMI 25.0-29.9)  Patient re-educated about  the importance of commitment to a  minimum of 150 minutes of exercise per week as able.  The importance of healthy food choices with portion control discussed, as well as eating regularly and within a 12 hour window most days. The need to choose "clean , green" food 50 to 75% of the time is discussed, as well as to make water the primary drink and set a goal of 64 ounces water daily.       10/17/2023    3:32 PM 07/17/2023   10:11 AM 04/09/2023    2:22 PM  Weight /BMI  Weight 166 lb 169 lb 0.6 oz 176 lb  Height 5\' 6"  (1.676 m) 5\' 6"  (1.676 m) 5\' 6"  (1.676 m)  BMI 26.79 kg/m2 27.28 kg/m2 28.41 kg/m2    Improved, near normal weight  Type 2 diabetes mellitus with other specified complication (HCC) Diabetes associated with hyperlipidemia  Ms. Jasso is reminded of the importance of commitment to daily physical activity for 30 minutes or more, as able and the need to limit carbohydrate intake to 30 to 60 grams per meal to help with blood sugar control.  The need to take medication as prescribed, test blood sugar as directed, and to call between visits if there is a concern that blood sugar is uncontrolled is also discussed.   Ms. Trivedi is reminded of the importance of daily foot exam, annual eye examination, and good blood sugar, blood pressure and cholesterol control.     Latest Ref Rng & Units 10/14/2023    8:24 AM 07/15/2023    8:20 AM 04/04/2023    8:00 AM 05/30/2022   10:15 AM 05/24/2022    8:36 AM  Diabetic Labs  HbA1c 4.8 - 5.6 % 7.3  7.2  7.9   7.2   Micro/Creat Ratio 0 - 29 mg/g creat  7   7     Chol 100 - 199 mg/dL 595  638  756   433   HDL >39 mg/dL 61  61  62   49   Calc LDL 0 - 99 mg/dL 295  188  416   606   Triglycerides 0 - 149 mg/dL 301  601  093   235   Creatinine 0.57 - 1.00 mg/dL 5.73  2.20  2.54   2.70       10/17/2023    3:32 PM 07/17/2023   10:11 AM 04/09/2023    2:22 PM 05/30/2022    9:26 AM 12/19/2021   10:42 AM 12/19/2021   10:07 AM 07/26/2021   10:03 AM  BP/Weight  Systolic BP 115 127 128 123 124 101 120  Diastolic BP 73 75 78 72 82 67 70  Wt. (Lbs) 166 169.04 176 174.04  179   BMI 26.79 kg/m2 27.28 kg/m2 28.41 kg/m2 28.09 kg/m2  28.89 kg/m2       Latest Ref Rng & Units 05/30/2022    9:20 AM 03/23/2021   12:00 AM  Foot/eye exam completion dates  Eye Exam No Retinopathy  No Retinopathy      Foot Form Completion  Done      This result is from an external source.

## 2023-10-21 NOTE — Assessment & Plan Note (Signed)
  Patient re-educated about  the importance of commitment to a  minimum of 150 minutes of exercise per week as able.  The importance of healthy food choices with portion control discussed, as well as eating regularly and within a 12 hour window most days. The need to choose "clean , green" food 50 to 75% of the time is discussed, as well as to make water the primary drink and set a goal of 64 ounces water daily.       10/17/2023    3:32 PM 07/17/2023   10:11 AM 04/09/2023    2:22 PM  Weight /BMI  Weight 166 lb 169 lb 0.6 oz 176 lb  Height 5\' 6"  (1.676 m) 5\' 6"  (1.676 m) 5\' 6"  (1.676 m)  BMI 26.79 kg/m2 27.28 kg/m2 28.41 kg/m2    Improved, near normal weight

## 2023-10-23 ENCOUNTER — Ambulatory Visit: Payer: Medicare Other | Admitting: Family Medicine

## 2023-10-30 ENCOUNTER — Telehealth: Payer: Self-pay | Admitting: Pharmacy Technician

## 2023-10-30 ENCOUNTER — Telehealth: Payer: Self-pay | Admitting: Family Medicine

## 2023-10-30 ENCOUNTER — Other Ambulatory Visit (HOSPITAL_COMMUNITY): Payer: Self-pay

## 2023-10-30 NOTE — Telephone Encounter (Signed)
 Pharmacy Patient Advocate Encounter   Received notification from Pt Calls Messages that prior authorization for DAPAGLIFLOZIN 5MG  TABLETS is required/requested.   Insurance verification completed.   The patient is insured through [place] .   Per test claim:  BRAND NAME FARXIGA 5MG  is preferred by the insurance.  If suggested medication is appropriate, Please send in a new RX and discontinue this one. If not, please advise as to why it's not appropriate so that we may request a Prior Authorization. Please note, some preferred medications may still require a PA.  If the suggested medications have not been trialed and there are no contraindications to their use, the PA will not be submitted, as it will not be approved.  New prescription does not need to be sent. Pharmacy can substitute as Insurance prefers brand name.

## 2023-10-30 NOTE — Telephone Encounter (Signed)
 PA request has been Received. New Encounter has been or will be created for follow up. For additional info see Pharmacy Prior Auth telephone encounter from 10/30/2023.

## 2023-10-30 NOTE — Telephone Encounter (Signed)
 Can PA be done on her farxiga? Received fax that her insurance will no longer cover the generic farxiga but the alternatives listed was [place]. Can you pls check on this.

## 2023-10-30 NOTE — Telephone Encounter (Signed)
 Pls see if  pt can get farxiga 5 mg tablet approved through her insurance  Letter of denial is in nurse box, she is asking to stay on the drug ( She may be required to try alternatives, however we need to start the process) thanks

## 2023-11-01 NOTE — Telephone Encounter (Signed)
 NOTED

## 2023-11-13 DIAGNOSIS — M9903 Segmental and somatic dysfunction of lumbar region: Secondary | ICD-10-CM | POA: Diagnosis not present

## 2023-11-13 DIAGNOSIS — M9902 Segmental and somatic dysfunction of thoracic region: Secondary | ICD-10-CM | POA: Diagnosis not present

## 2023-11-13 DIAGNOSIS — M546 Pain in thoracic spine: Secondary | ICD-10-CM | POA: Diagnosis not present

## 2023-11-13 DIAGNOSIS — M62830 Muscle spasm of back: Secondary | ICD-10-CM | POA: Diagnosis not present

## 2023-11-13 DIAGNOSIS — M9905 Segmental and somatic dysfunction of pelvic region: Secondary | ICD-10-CM | POA: Diagnosis not present

## 2023-11-20 DIAGNOSIS — M9902 Segmental and somatic dysfunction of thoracic region: Secondary | ICD-10-CM | POA: Diagnosis not present

## 2023-11-20 DIAGNOSIS — M546 Pain in thoracic spine: Secondary | ICD-10-CM | POA: Diagnosis not present

## 2023-11-20 DIAGNOSIS — M9903 Segmental and somatic dysfunction of lumbar region: Secondary | ICD-10-CM | POA: Diagnosis not present

## 2023-11-20 DIAGNOSIS — M9905 Segmental and somatic dysfunction of pelvic region: Secondary | ICD-10-CM | POA: Diagnosis not present

## 2023-11-20 DIAGNOSIS — M62830 Muscle spasm of back: Secondary | ICD-10-CM | POA: Diagnosis not present

## 2024-01-16 DIAGNOSIS — L820 Inflamed seborrheic keratosis: Secondary | ICD-10-CM | POA: Diagnosis not present

## 2024-01-16 DIAGNOSIS — Z85820 Personal history of malignant melanoma of skin: Secondary | ICD-10-CM | POA: Diagnosis not present

## 2024-01-16 DIAGNOSIS — D225 Melanocytic nevi of trunk: Secondary | ICD-10-CM | POA: Diagnosis not present

## 2024-01-16 DIAGNOSIS — Z08 Encounter for follow-up examination after completed treatment for malignant neoplasm: Secondary | ICD-10-CM | POA: Diagnosis not present

## 2024-01-16 DIAGNOSIS — Z1283 Encounter for screening for malignant neoplasm of skin: Secondary | ICD-10-CM | POA: Diagnosis not present

## 2024-02-11 DIAGNOSIS — E1169 Type 2 diabetes mellitus with other specified complication: Secondary | ICD-10-CM | POA: Diagnosis not present

## 2024-02-12 ENCOUNTER — Ambulatory Visit: Payer: Self-pay | Admitting: Family Medicine

## 2024-02-12 LAB — CBC WITH DIFFERENTIAL/PLATELET
Basophils Absolute: 0.1 10*3/uL (ref 0.0–0.2)
Basos: 1 %
EOS (ABSOLUTE): 0.5 10*3/uL — ABNORMAL HIGH (ref 0.0–0.4)
Eos: 8 %
Hematocrit: 46.3 % (ref 34.0–46.6)
Hemoglobin: 14.7 g/dL (ref 11.1–15.9)
Immature Grans (Abs): 0 10*3/uL (ref 0.0–0.1)
Immature Granulocytes: 0 %
Lymphocytes Absolute: 2.3 10*3/uL (ref 0.7–3.1)
Lymphs: 33 %
MCH: 30.5 pg (ref 26.6–33.0)
MCHC: 31.7 g/dL (ref 31.5–35.7)
MCV: 96 fL (ref 79–97)
Monocytes Absolute: 0.6 10*3/uL (ref 0.1–0.9)
Monocytes: 9 %
Neutrophils Absolute: 3.4 10*3/uL (ref 1.4–7.0)
Neutrophils: 49 %
Platelets: 237 10*3/uL (ref 150–450)
RBC: 4.82 x10E6/uL (ref 3.77–5.28)
RDW: 12.1 % (ref 11.7–15.4)
WBC: 6.8 10*3/uL (ref 3.4–10.8)

## 2024-02-12 LAB — BMP8+EGFR
BUN/Creatinine Ratio: 24 (ref 12–28)
BUN: 19 mg/dL (ref 8–27)
CO2: 20 mmol/L (ref 20–29)
Calcium: 9.4 mg/dL (ref 8.7–10.3)
Chloride: 104 mmol/L (ref 96–106)
Creatinine, Ser: 0.78 mg/dL (ref 0.57–1.00)
Glucose: 155 mg/dL — ABNORMAL HIGH (ref 70–99)
Potassium: 3.9 mmol/L (ref 3.5–5.2)
Sodium: 139 mmol/L (ref 134–144)
eGFR: 76 mL/min/{1.73_m2} (ref >59)

## 2024-02-12 LAB — VITAMIN B12: Vitamin B-12: 448 pg/mL (ref 232–1245)

## 2024-02-12 LAB — TSH: TSH: 2.93 u[IU]/mL (ref 0.450–4.500)

## 2024-02-12 LAB — HEMOGLOBIN A1C
Est. average glucose Bld gHb Est-mCnc: 163 mg/dL
Hgb A1c MFr Bld: 7.3 % — ABNORMAL HIGH (ref 4.8–5.6)

## 2024-02-18 ENCOUNTER — Ambulatory Visit: Admitting: Family Medicine

## 2024-02-18 ENCOUNTER — Encounter: Payer: Self-pay | Admitting: Family Medicine

## 2024-02-18 ENCOUNTER — Other Ambulatory Visit: Payer: Self-pay | Admitting: Family Medicine

## 2024-02-18 VITALS — BP 118/80 | HR 63 | Resp 16 | Ht 66.0 in | Wt 167.0 lb

## 2024-02-18 DIAGNOSIS — J309 Allergic rhinitis, unspecified: Secondary | ICD-10-CM

## 2024-02-18 DIAGNOSIS — G609 Hereditary and idiopathic neuropathy, unspecified: Secondary | ICD-10-CM

## 2024-02-18 DIAGNOSIS — E1169 Type 2 diabetes mellitus with other specified complication: Secondary | ICD-10-CM | POA: Diagnosis not present

## 2024-02-18 DIAGNOSIS — E785 Hyperlipidemia, unspecified: Secondary | ICD-10-CM | POA: Diagnosis not present

## 2024-02-18 DIAGNOSIS — Z1231 Encounter for screening mammogram for malignant neoplasm of breast: Secondary | ICD-10-CM

## 2024-02-18 MED ORDER — ROSUVASTATIN CALCIUM 5 MG PO TABS: ORAL_TABLET | ORAL | 3 refills | Status: AC

## 2024-02-18 NOTE — Patient Instructions (Addendum)
 F/U in 4 months  Please schedule mammogram at checkout [facility], verify with  pt please)  New for cholesterol is TWICE weekly, SundAY and Thursday, Rosuvastatin  5 mg    Nurse pls rx TENS unit , dx is neuropathic pain in feet.   Fasting  lipid, cmp and EGFr and HBA1C 3 to 6 days before next scheduled appt  Continue to keep active and eat mainly vegetables, beans , lean protein and fruit, fresh or frozen. Drink 64 ounces water  dAILY  Thanks for choosing [facility], we consider it a privelige to serve you.  Thanks for choosing [facility], we consider it a privelige to serve you.

## 2024-02-22 ENCOUNTER — Encounter: Payer: Self-pay | Admitting: Family Medicine

## 2024-02-22 NOTE — Assessment & Plan Note (Signed)
 Controlled, no change in medication

## 2024-02-22 NOTE — Assessment & Plan Note (Signed)
 Diabetes associated with hyperlipidemia  Taylor Mitchell is reminded of the importance of commitment to daily physical activity for 30 minutes or more, as able and the need to limit carbohydrate intake to 30 to 60 grams per meal to help with blood sugar control.   The need to take medication as prescribed, test blood sugar as directed, and to call between visits if there is a concern that blood sugar is uncontrolled is also discussed.   Taylor Mitchell is reminded of the importance of daily foot exam, annual eye examination, and good blood sugar, blood pressure and cholesterol control.     Latest Ref Rng & Units 02/11/2024    8:12 AM 10/14/2023    8:24 AM 07/15/2023    8:20 AM 04/04/2023    8:00 AM 05/30/2022   10:15 AM  Diabetic Labs  HbA1c 4.8 - 5.6 % 7.3  7.3  7.2  7.9    Micro/Creat Ratio 0 - 29 mg/g creat   7   7   Chol 100 - 199 mg/dL  696  694  701    HDL >60 mg/dL  61  61  62    Calc LDL 0 - 99 mg/dL  777  778  782    Triglycerides 0 - 149 mg/dL  883  872  890    Creatinine 0.57 - 1.00 mg/dL 9.21  9.24  9.29  9.24        02/18/2024   10:00 AM 02/18/2024    9:41 AM 10/17/2023    3:32 PM 07/17/2023   10:11 AM 04/09/2023    2:22 PM 05/30/2022    9:26 AM 12/19/2021   10:42 AM  BP/Weight  Systolic BP 118 102 115 127 128 123 124  Diastolic BP 80 67 73 75 78 72 82  Wt. (Lbs)  167.04 166 169.04 176 174.04   BMI  26.96 kg/m2 26.79 kg/m2 27.28 kg/m2 28.41 kg/m2 28.09 kg/m2       Latest Ref Rng & Units 05/30/2022    9:20 AM 03/23/2021   12:00 AM  Foot/eye exam completion dates  Eye Exam No Retinopathy  No Retinopathy      Foot Form Completion  Done      This result is from an external source.      Controlled, no change in medication

## 2024-02-22 NOTE — Progress Notes (Signed)
 Taylor Mitchell     MRN: [id number]      DOB: Jan 05, 1941  Chief Complaint  Patient presents with   Medical Management of Chronic Issues    14 week follow up     HPI Taylor Mitchell is here for follow up and re-evaluation of chronic medical conditions, medication management and review of any available recent lab and radiology data.  Preventive health is updated, specifically  Cancer screening and Immunization.   Questions or concerns regarding consultations or procedures which the PT has had in the interim are  addressed. The PT denies any adverse reactions to current medications since the last visit.  There are no new concerns.  There are no specific complaints   ROS Denies recent fever or chills. Denies sinus pressure, nasal congestion, ear pain or sore throat. Denies chest congestion, productive cough or wheezing. Denies chest pains, palpitations and leg swelling Denies abdominal pain, nausea, vomiting,diarrhea or constipation.   Denies dysuria, frequency, hesitancy or incontinence. Denies joint pain, swelling and limitation in mobility. Denies headaches, seizures, numbness, or tingling. Denies depression, anxiety or insomnia. Denies skin break down or rash.   PE  BP 118/80   Pulse 63   Resp 16   Ht 5' 6 (1.676 m)   Wt 167 lb 0.6 oz (75.8 kg)   SpO2 92%   BMI 26.96 kg/m   Patient alert and oriented and in no cardiopulmonary distress.  HEENT: No facial asymmetry, EOMI,     Neck supple .  Chest: Clear to auscultation bilaterally.  CVS: S1, S2 no murmurs, no S3.Regular rate.  ABD: Soft non tender.   Ext: No edema  MS: Adequate ROM spine, shoulders, hips and knees.  Skin: Intact, no ulcerations or rash noted.  Psych: Good eye contact, normal affect. Memory intact not anxious or depressed appearing.  CNS: CN 2-12 intact, power,  normal throughout.no focal deficits noted.   Assessment & Plan  Allergic rhinitis Controlled, no change in  medication   Hyperlipidemia associated with type 2 diabetes mellitus (HCC) Hyperlipidemia:Low fat diet discussed and encouraged.   Lipid Panel  Lab Results  Component Value Date   CHOL 303 (H) 10/14/2023   HDL 61 10/14/2023   LDLCALC 222 (H) 10/14/2023   TRIG 116 10/14/2023   CHOLHDL 5.0 (H) 10/14/2023     Will try twice weekly crestor  5 mg dose  Type 2 diabetes mellitus with other specified complication (HCC) Diabetes associated with hyperlipidemia  Taylor Mitchell is reminded of the importance of commitment to daily physical activity for 30 minutes or more, as able and the need to limit carbohydrate intake to 30 to 60 grams per meal to help with blood sugar control.   The need to take medication as prescribed, test blood sugar as directed, and to call between visits if there is a concern that blood sugar is uncontrolled is also discussed.   Taylor Mitchell is reminded of the importance of daily foot exam, annual eye examination, and good blood sugar, blood pressure and cholesterol control.     Latest Ref Rng & Units 02/11/2024    8:12 AM 10/14/2023    8:24 AM 07/15/2023    8:20 AM 04/04/2023    8:00 AM 05/30/2022   10:15 AM  Diabetic Labs  HbA1c 4.8 - 5.6 % 7.3  7.3  7.2  7.9    Micro/Creat Ratio 0 - 29 mg/g creat   7   7   Chol 100 - 199 mg/dL  696  305  298    HDL >39 mg/dL  61  61  62    Calc LDL 0 - 99 mg/dL  777  778  782    Triglycerides 0 - 149 mg/dL  883  872  890    Creatinine 0.57 - 1.00 mg/dL 9.21  9.24  9.29  9.24        02/18/2024   10:00 AM 02/18/2024    9:41 AM 10/17/2023    3:32 PM 07/17/2023   10:11 AM 04/09/2023    2:22 PM 05/30/2022    9:26 AM 12/19/2021   10:42 AM  BP/Weight  Systolic BP 118 102 115 127 128 123 124  Diastolic BP 80 67 73 75 78 72 82  Wt. (Lbs)  167.04 166 169.04 176 174.04   BMI  26.96 kg/m2 26.79 kg/m2 27.28 kg/m2 28.41 kg/m2 28.09 kg/m2       Latest Ref Rng & Units 05/30/2022    9:20 AM 03/23/2021   12:00 AM  Foot/eye exam completion dates   Eye Exam No Retinopathy  No Retinopathy      Foot Form Completion  Done      This result is from an external source.      Controlled, no change in medication   Hereditary and idiopathic peripheral neuropathy Reports increased nerve pain will try TENS

## 2024-02-22 NOTE — Assessment & Plan Note (Signed)
 Hyperlipidemia:Low fat diet discussed and encouraged.   Lipid Panel  Lab Results  Component Value Date   CHOL 303 (H) 10/14/2023   HDL 61 10/14/2023   LDLCALC 222 (H) 10/14/2023   TRIG 116 10/14/2023   CHOLHDL 5.0 (H) 10/14/2023     Will try twice weekly crestor  5 mg dose

## 2024-02-22 NOTE — Assessment & Plan Note (Signed)
 Reports increased nerve pain will try TENS

## 2024-03-10 ENCOUNTER — Ambulatory Visit
Admission: RE | Admit: 2024-03-10 | Discharge: 2024-03-10 | Disposition: A | Discharge status: Home / Self Care | Source: Ambulatory Visit | Attending: Family Medicine | Admitting: Family Medicine

## 2024-03-10 DIAGNOSIS — Z1231 Encounter for screening mammogram for malignant neoplasm of breast: Secondary | ICD-10-CM | POA: Insufficient documentation

## 2024-05-06 DIAGNOSIS — E113393 Type 2 diabetes mellitus with moderate nonproliferative diabetic retinopathy without macular edema, bilateral: Secondary | ICD-10-CM | POA: Diagnosis not present

## 2024-06-09 NOTE — Progress Notes (Signed)
 Trinidi Toppins Schmall                                          MRN: [id number]   06/09/2024   The [facility] Quality Team Specialist reviewed this patient medical record for the purposes of chart review for care gap closure. The following were reviewed: chart review for care gap closure-kidney health evaluation for diabetes:eGFR  and uACR.    [facility] Quality Team

## 2024-06-23 ENCOUNTER — Ambulatory Visit: Admitting: Family Medicine

## 2024-08-26 ENCOUNTER — Other Ambulatory Visit: Payer: Self-pay | Admitting: Family Medicine

## 2024-09-16 ENCOUNTER — Ambulatory Visit: Admitting: Family Medicine

## 2024-09-17 ENCOUNTER — Ambulatory Visit: Admitting: Family Medicine

## 2024-11-12 ENCOUNTER — Ambulatory Visit: Payer: Self-pay | Admitting: Family Medicine
